# Patient Record
Sex: Male | Born: 1935 | Race: White | Hispanic: No | Marital: Married | Smoking: Former smoker
Health system: Southern US, Community
[De-identification: ages and names within clinical notes are randomized; demographics above are authoritative.]

## PROBLEM LIST (undated history)

## (undated) DIAGNOSIS — I2510 Atherosclerotic heart disease of native coronary artery without angina pectoris: Secondary | ICD-10-CM

## (undated) DIAGNOSIS — M1990 Unspecified osteoarthritis, unspecified site: Secondary | ICD-10-CM

## (undated) DIAGNOSIS — I10 Essential (primary) hypertension: Secondary | ICD-10-CM

## (undated) DIAGNOSIS — C61 Malignant neoplasm of prostate: Secondary | ICD-10-CM

## (undated) DIAGNOSIS — E785 Hyperlipidemia, unspecified: Secondary | ICD-10-CM

## (undated) HISTORY — DX: Atherosclerotic heart disease of native coronary artery without angina pectoris: I25.10

## (undated) HISTORY — PX: KNEE SURGERY: SHX244

## (undated) HISTORY — DX: Essential (primary) hypertension: I10

## (undated) HISTORY — DX: Unspecified osteoarthritis, unspecified site: M19.90

## (undated) HISTORY — DX: Malignant neoplasm of prostate: C61

## (undated) HISTORY — DX: Hyperlipidemia, unspecified: E78.5

---

## 1997-02-07 DIAGNOSIS — C61 Malignant neoplasm of prostate: Secondary | ICD-10-CM

## 1997-02-07 HISTORY — PX: OTHER SURGICAL HISTORY: SHX169

## 1997-02-07 HISTORY — DX: Malignant neoplasm of prostate: C61

## 2004-07-14 ENCOUNTER — Ambulatory Visit: Payer: Self-pay | Admitting: Cardiology

## 2004-07-20 ENCOUNTER — Ambulatory Visit: Payer: Self-pay | Admitting: Cardiology

## 2005-07-06 ENCOUNTER — Encounter: Admission: RE | Admit: 2005-07-06 | Discharge: 2005-07-06 | Payer: Self-pay | Admitting: Family Medicine

## 2008-05-08 HISTORY — PX: OTHER SURGICAL HISTORY: SHX169

## 2010-07-07 ENCOUNTER — Encounter: Payer: Self-pay | Admitting: Physician Assistant

## 2011-04-06 DIAGNOSIS — C61 Malignant neoplasm of prostate: Secondary | ICD-10-CM | POA: Diagnosis not present

## 2011-07-05 DIAGNOSIS — C61 Malignant neoplasm of prostate: Secondary | ICD-10-CM | POA: Diagnosis not present

## 2011-07-13 DIAGNOSIS — C61 Malignant neoplasm of prostate: Secondary | ICD-10-CM | POA: Diagnosis not present

## 2011-10-17 DIAGNOSIS — C61 Malignant neoplasm of prostate: Secondary | ICD-10-CM | POA: Diagnosis not present

## 2011-10-26 DIAGNOSIS — I10 Essential (primary) hypertension: Secondary | ICD-10-CM | POA: Diagnosis not present

## 2012-01-18 DIAGNOSIS — C61 Malignant neoplasm of prostate: Secondary | ICD-10-CM | POA: Diagnosis not present

## 2012-03-15 DIAGNOSIS — H04129 Dry eye syndrome of unspecified lacrimal gland: Secondary | ICD-10-CM | POA: Diagnosis not present

## 2012-03-15 DIAGNOSIS — H538 Other visual disturbances: Secondary | ICD-10-CM | POA: Diagnosis not present

## 2012-03-15 DIAGNOSIS — E119 Type 2 diabetes mellitus without complications: Secondary | ICD-10-CM | POA: Diagnosis not present

## 2012-03-15 DIAGNOSIS — H2510 Age-related nuclear cataract, unspecified eye: Secondary | ICD-10-CM | POA: Diagnosis not present

## 2012-04-18 DIAGNOSIS — R972 Elevated prostate specific antigen [PSA]: Secondary | ICD-10-CM | POA: Diagnosis not present

## 2012-04-25 DIAGNOSIS — C61 Malignant neoplasm of prostate: Secondary | ICD-10-CM | POA: Diagnosis not present

## 2012-08-01 DIAGNOSIS — C61 Malignant neoplasm of prostate: Secondary | ICD-10-CM | POA: Diagnosis not present

## 2012-09-21 ENCOUNTER — Encounter: Payer: Self-pay | Admitting: Family Medicine

## 2012-09-21 ENCOUNTER — Ambulatory Visit (INDEPENDENT_AMBULATORY_CARE_PROVIDER_SITE_OTHER): Payer: Medicare Other | Admitting: Family Medicine

## 2012-09-21 VITALS — BP 100/54 | HR 81 | Temp 97.3°F | Ht 68.0 in | Wt 229.6 lb

## 2012-09-21 DIAGNOSIS — E119 Type 2 diabetes mellitus without complications: Secondary | ICD-10-CM | POA: Diagnosis not present

## 2012-09-21 DIAGNOSIS — I10 Essential (primary) hypertension: Secondary | ICD-10-CM

## 2012-09-21 DIAGNOSIS — E785 Hyperlipidemia, unspecified: Secondary | ICD-10-CM | POA: Diagnosis not present

## 2012-09-21 DIAGNOSIS — L989 Disorder of the skin and subcutaneous tissue, unspecified: Secondary | ICD-10-CM

## 2012-09-21 MED ORDER — SITAGLIPTIN PHOSPHATE 100 MG PO TABS: 100.0000 mg | ORAL_TABLET | Freq: Every day | ORAL | Status: DC

## 2012-09-21 MED ORDER — PIOGLITAZONE HCL 15 MG PO TABS: 15.0000 mg | ORAL_TABLET | Freq: Every day | ORAL | Status: DC

## 2012-09-21 MED ORDER — ATORVASTATIN CALCIUM 20 MG PO TABS: 20.0000 mg | ORAL_TABLET | Freq: Every day | ORAL | Status: DC

## 2012-09-21 MED ORDER — LISINOPRIL-HYDROCHLOROTHIAZIDE 20-25 MG PO TABS: 1.0000 | ORAL_TABLET | Freq: Every day | ORAL | Status: DC

## 2012-09-21 NOTE — Patient Instructions (Signed)

## 2012-09-21 NOTE — Progress Notes (Signed)
  Subjective:    Patient ID: Victor Tapia., male    DOB: [birth year], 79 y.o.   MRN: [id number]  HPI This 79 y.o. male presents for evaluation of skin lesion on his right forehead.  He has  Hx of DM, hyperlipidemia,and htn.  He needs refills on medicine.   Review of Systems C/o skin lesion on right forehead. No chest pain, SOB, HA, dizziness, vision change, N/V, diarrhea, constipation, dysuria, urinary urgency or frequency, myalgias, arthralgias or rash.  Objective:   Physical Exam  Vital signs noted  Well developed well nourished male.  HEENT - Head atraumatic Normocephalic                Eyes - PERRLA, Conjuctiva - clear Sclera- Clear EOMI                Ears - EAC's Wnl TM's Wnl Gross Hearing WNL                Nose - Nares patent                 Throat - oropharanx wnl Respiratory - Lungs CTA bilateral Cardiac - RRR S1 and S2 without murmur GI - Abdomen soft Nontender and bowel sounds active x 4 Skin - Oval skin lesion on forehead lesion approx 0.5cm in diameter  Extremities - No edema. Neuro - Grossly intact.      Assessment & Plan:  Skin lesion - Plan: Ambulatory referral to Dermatology  Diabetes - Plan: pioglitazone (ACTOS) 15 MG tablet, sitaGLIPtin (JANUVIA) 100 MG tablet  Essential hypertension, benign - Plan: lisinopril-hydrochlorothiazide (PRINZIDE,ZESTORETIC) 20-25 MG per tablet  Other and unspecified hyperlipidemia - Plan: atorvastatin (LIPITOR) 20 MG tablet Patient declines labs today.  Follow up in 3 months for follow visit.

## 2012-10-22 DIAGNOSIS — C44319 Basal cell carcinoma of skin of other parts of face: Secondary | ICD-10-CM | POA: Diagnosis not present

## 2012-10-22 DIAGNOSIS — L821 Other seborrheic keratosis: Secondary | ICD-10-CM | POA: Diagnosis not present

## 2012-10-22 DIAGNOSIS — L570 Actinic keratosis: Secondary | ICD-10-CM | POA: Diagnosis not present

## 2012-10-22 DIAGNOSIS — D485 Neoplasm of uncertain behavior of skin: Secondary | ICD-10-CM | POA: Diagnosis not present

## 2012-11-01 DIAGNOSIS — L570 Actinic keratosis: Secondary | ICD-10-CM | POA: Diagnosis not present

## 2012-11-01 DIAGNOSIS — C44319 Basal cell carcinoma of skin of other parts of face: Secondary | ICD-10-CM | POA: Diagnosis not present

## 2012-11-05 DIAGNOSIS — C61 Malignant neoplasm of prostate: Secondary | ICD-10-CM | POA: Diagnosis not present

## 2012-11-12 DIAGNOSIS — L819 Disorder of pigmentation, unspecified: Secondary | ICD-10-CM | POA: Diagnosis not present

## 2012-11-12 DIAGNOSIS — D485 Neoplasm of uncertain behavior of skin: Secondary | ICD-10-CM | POA: Diagnosis not present

## 2013-01-07 ENCOUNTER — Telehealth: Payer: Self-pay | Admitting: *Deleted

## 2013-01-07 ENCOUNTER — Telehealth: Payer: Self-pay | Admitting: Pharmacist

## 2013-01-07 NOTE — Telephone Encounter (Signed)
Called to inform pt that he needs office visit for diabetes check up Pt declined appt, stated he would come in after the first of the year

## 2013-01-07 NOTE — Telephone Encounter (Signed)
Called patient to make Annual Wellness Visit - he refused appt.

## 2013-02-11 DIAGNOSIS — C61 Malignant neoplasm of prostate: Secondary | ICD-10-CM | POA: Diagnosis not present

## 2013-03-07 ENCOUNTER — Ambulatory Visit (INDEPENDENT_AMBULATORY_CARE_PROVIDER_SITE_OTHER): Payer: Medicare Other | Admitting: Family Medicine

## 2013-03-07 ENCOUNTER — Encounter: Payer: Self-pay | Admitting: Family Medicine

## 2013-03-07 VITALS — BP 152/71 | HR 83 | Temp 97.6°F | Resp 20 | Ht 68.0 in | Wt 228.0 lb

## 2013-03-07 DIAGNOSIS — E119 Type 2 diabetes mellitus without complications: Secondary | ICD-10-CM | POA: Diagnosis not present

## 2013-03-07 DIAGNOSIS — I10 Essential (primary) hypertension: Secondary | ICD-10-CM | POA: Diagnosis not present

## 2013-03-07 DIAGNOSIS — H60399 Other infective otitis externa, unspecified ear: Secondary | ICD-10-CM

## 2013-03-07 LAB — POCT CBC
Granulocyte percent: 74.6 %G (ref 37–80)
HCT, POC: 41.7 % — AB (ref 43.5–53.7)
Hemoglobin: 13.6 g/dL — AB (ref 14.1–18.1)
Lymph, poc: 1.8 (ref 0.6–3.4)
MCH, POC: 30.2 pg (ref 27–31.2)
MCHC: 32.7 g/dL (ref 31.8–35.4)
MCV: 92.5 fL (ref 80–97)
MPV: 8 fL (ref 0–99.8)
POC Granulocyte: 6.3 (ref 2–6.9)
POC LYMPH PERCENT: 21.3 %L (ref 10–50)
Platelet Count, POC: 205 10*3/uL (ref 142–424)
RBC: 4.5 M/uL — AB (ref 4.69–6.13)
RDW, POC: 13.8 %
WBC: 8.4 10*3/uL (ref 4.6–10.2)

## 2013-03-07 LAB — POCT GLYCOSYLATED HEMOGLOBIN (HGB A1C): Hemoglobin A1C: 7.1

## 2013-03-07 MED ORDER — NEOMYCIN-COLIST-HC-THONZONIUM 3.3-3-10-0.5 MG/ML OT SUSP: 3.0000 [drp] | Freq: Four times a day (QID) | OTIC | Status: DC

## 2013-03-07 NOTE — Progress Notes (Signed)
   Subjective:    Patient ID: Victor Tapia., male    DOB: May 18, 1926, 80 y.o.   MRN: [id number]  HPI  This 80 y.o. male presents for evaluation of hypertension, diabetes, and hyperlipidemia. He is due for labs  Review of Systems    No chest pain, SOB, HA, dizziness, vision change, N/V, diarrhea, constipation, dysuria, urinary urgency or frequency, myalgias, arthralgias or rash.   Objective:   Physical Exam  Vital signs noted  Well developed well nourished male.  HEENT - Head atraumatic Normocephalic                Eyes - PERRLA, Conjuctiva - clear Sclera- Clear EOMI                Ears - EAC's Wnl TM's Wnl Gross Hearing WNL                Throat - oropharanx wnl Respiratory - Lungs CTA bilateral Cardiac - RRR S1 and S2 without murmur GI - Abdomen soft Nontender and bowel sounds active x 4 Extremities - No edema. Neuro - Grossly intact.      Assessment & Plan:  Otitis, externa, infective - Plan: neomycin-colistin-hydrocortisone-thonzonium (CORTISPORIN-TC) 3.04-09-08-0.5 MG/ML otic suspension  Essential hypertension, benign - Plan: POCT CBC, CMP14+EGFR, TSH  Diabetes - Plan: POCT glycosylated hemoglobin (Hb A1C)  Lysbeth Penner FNP

## 2013-03-08 LAB — CMP14+EGFR
ALT: 12 IU/L (ref 0–44)
AST: 20 IU/L (ref 0–40)
Albumin/Globulin Ratio: 1.5 (ref 1.1–2.5)
Albumin: 4.4 g/dL (ref 3.5–4.7)
Alkaline Phosphatase: 106 IU/L (ref 39–117)
BUN/Creatinine Ratio: 17 (ref 10–22)
BUN: 26 mg/dL (ref 8–27)
CO2: 28 mmol/L (ref 18–29)
Calcium: 9.8 mg/dL (ref 8.6–10.2)
Chloride: 99 mmol/L (ref 97–108)
Creatinine, Ser: 1.52 mg/dL — ABNORMAL HIGH (ref 0.76–1.27)
GFR calc Af Amer: 48 mL/min/{1.73_m2} — ABNORMAL LOW (ref >59)
GFR calc non Af Amer: 41 mL/min/{1.73_m2} — ABNORMAL LOW (ref >59)
Globulin, Total: 2.9 g/dL (ref 1.5–4.5)
Glucose: 140 mg/dL — ABNORMAL HIGH (ref 65–99)
Potassium: 4.4 mmol/L (ref 3.5–5.2)
Sodium: 145 mmol/L — ABNORMAL HIGH (ref 134–144)
Total Bilirubin: 0.3 mg/dL (ref 0.0–1.2)
Total Protein: 7.3 g/dL (ref 6.0–8.5)

## 2013-03-08 LAB — TSH: TSH: 1.56 u[IU]/mL (ref 0.450–4.500)

## 2013-05-20 DIAGNOSIS — L570 Actinic keratosis: Secondary | ICD-10-CM | POA: Diagnosis not present

## 2013-05-20 DIAGNOSIS — L905 Scar conditions and fibrosis of skin: Secondary | ICD-10-CM | POA: Diagnosis not present

## 2013-05-20 DIAGNOSIS — Z85828 Personal history of other malignant neoplasm of skin: Secondary | ICD-10-CM | POA: Diagnosis not present

## 2013-05-20 DIAGNOSIS — D485 Neoplasm of uncertain behavior of skin: Secondary | ICD-10-CM | POA: Diagnosis not present

## 2013-05-20 DIAGNOSIS — D0420 Carcinoma in situ of skin of unspecified ear and external auricular canal: Secondary | ICD-10-CM | POA: Diagnosis not present

## 2013-05-22 DIAGNOSIS — C61 Malignant neoplasm of prostate: Secondary | ICD-10-CM | POA: Diagnosis not present

## 2013-05-28 ENCOUNTER — Encounter: Payer: Self-pay | Admitting: *Deleted

## 2013-06-04 DIAGNOSIS — L570 Actinic keratosis: Secondary | ICD-10-CM | POA: Diagnosis not present

## 2013-06-04 DIAGNOSIS — C44221 Squamous cell carcinoma of skin of unspecified ear and external auricular canal: Secondary | ICD-10-CM | POA: Diagnosis not present

## 2013-07-31 ENCOUNTER — Other Ambulatory Visit: Payer: Self-pay | Admitting: *Deleted

## 2013-08-07 ENCOUNTER — Telehealth: Payer: Self-pay | Admitting: *Deleted

## 2013-08-07 NOTE — Telephone Encounter (Signed)
Received rx refill request from [facility] for lisinopril 20mg . Do not see this on med list. Please advise on refill. You saw him in 1-15.

## 2013-08-08 ENCOUNTER — Telehealth: Payer: Self-pay | Admitting: Family Medicine

## 2013-08-08 DIAGNOSIS — I10 Essential (primary) hypertension: Secondary | ICD-10-CM

## 2013-08-08 MED ORDER — LISINOPRIL-HYDROCHLOROTHIAZIDE 20-25 MG PO TABS: 1.0000 | ORAL_TABLET | Freq: Every day | ORAL | Status: DC

## 2013-08-08 NOTE — Telephone Encounter (Signed)
Done per Dietrich Pates VO

## 2013-08-10 ENCOUNTER — Other Ambulatory Visit: Payer: Self-pay | Admitting: Family Medicine

## 2013-08-12 ENCOUNTER — Other Ambulatory Visit: Payer: Self-pay | Admitting: Family Medicine

## 2013-08-12 NOTE — Telephone Encounter (Signed)
He was on zestril hct and this is in the note.  He is elderly and hct can cause more work on kidneys.  Take the lisinopril sent by the rx company and follow up and will check bp

## 2013-08-13 NOTE — Telephone Encounter (Signed)
Last seen and last labs 03/07/13  B [place]

## 2013-08-15 NOTE — Telephone Encounter (Signed)
Called [facility] patient is usually on Lisinopril and HCTZ separately. We ordered Zestoretic, this is ok, pt used to be on zestoretic until it went on back order, then they split it. Pt made aware that when new bottle comes, that it will only be one pill. Pt understands

## 2013-08-22 DIAGNOSIS — C61 Malignant neoplasm of prostate: Secondary | ICD-10-CM | POA: Diagnosis not present

## 2013-08-24 ENCOUNTER — Other Ambulatory Visit: Payer: Self-pay | Admitting: Family Medicine

## 2013-09-03 ENCOUNTER — Ambulatory Visit (INDEPENDENT_AMBULATORY_CARE_PROVIDER_SITE_OTHER): Payer: Medicare Other | Admitting: Family Medicine

## 2013-09-03 VITALS — BP 118/54 | HR 73 | Temp 98.6°F | Ht 68.0 in | Wt 230.8 lb

## 2013-09-03 DIAGNOSIS — E119 Type 2 diabetes mellitus without complications: Secondary | ICD-10-CM | POA: Diagnosis not present

## 2013-09-03 DIAGNOSIS — E785 Hyperlipidemia, unspecified: Secondary | ICD-10-CM

## 2013-09-03 DIAGNOSIS — I10 Essential (primary) hypertension: Secondary | ICD-10-CM

## 2013-09-03 NOTE — Progress Notes (Signed)
   Subjective:    Patient ID: Victor Tapia., male    DOB: Aug 08, 1926, 80 y.o.   MRN: [id number]  HPI This 80 y.o. male presents for evaluation of diabetes visit.  He has hx of DM, hyperlipidemia, and hypertension.  He has no acute complaints.   Review of Systems No chest pain, SOB, HA, dizziness, vision change, N/V, diarrhea, constipation, dysuria, urinary urgency or frequency, myalgias, arthralgias or rash.     Objective:   Physical Exam  Vital signs noted  Well developed well nourished male.  HEENT - Head atraumatic Normocephalic                Eyes - PERRLA, Conjuctiva - clear Sclera- Clear EOMI                Ears - EAC's Wnl TM's Wnl Gross Hearing WNL                Nose - Nares patent                 Throat - oropharanx wnl Respiratory - Lungs CTA bilateral Cardiac - RRR S1 and S2 without murmur GI - Abdomen soft Nontender and bowel sounds active x 4 Extremities - No edema. Neuro - Grossly intact.      Assessment & Plan:  Diabetes mellitus type 2, controlled - Plan: POCT glycosylated hemoglobin (Hb A1C), CMP14+EGFR, Lipid panel  Essential hypertension - Plan: POCT CBC, CMP14+EGFR, Lipid panel  Hyperlipemia - Plan: CMP14+EGFR  Essential hypertension, benign - Conitnue current regimen.  Follow up in 3 months  Lysbeth Penner FNP

## 2013-09-04 ENCOUNTER — Ambulatory Visit: Payer: Medicare Other | Admitting: Family Medicine

## 2013-09-04 ENCOUNTER — Other Ambulatory Visit: Payer: Medicare Other

## 2013-09-04 DIAGNOSIS — E785 Hyperlipidemia, unspecified: Secondary | ICD-10-CM | POA: Diagnosis not present

## 2013-09-04 DIAGNOSIS — E119 Type 2 diabetes mellitus without complications: Secondary | ICD-10-CM

## 2013-09-04 DIAGNOSIS — I10 Essential (primary) hypertension: Secondary | ICD-10-CM | POA: Diagnosis not present

## 2013-09-04 LAB — POCT CBC
Granulocyte percent: 69 %G (ref 37–80)
HCT, POC: 38.3 % — AB (ref 43.5–53.7)
Hemoglobin: 12.5 g/dL — AB (ref 14.1–18.1)
Lymph, poc: 2.3 (ref 0.6–3.4)
MCH, POC: 30.2 pg (ref 27–31.2)
MCHC: 32.7 g/dL (ref 31.8–35.4)
MCV: 92.6 fL (ref 80–97)
MPV: 7.7 fL (ref 0–99.8)
POC Granulocyte: 6.2 (ref 2–6.9)
POC LYMPH PERCENT: 25.5 %L (ref 10–50)
Platelet Count, POC: 236 10*3/uL (ref 142–424)
RBC: 4.1 M/uL — AB (ref 4.69–6.13)
RDW, POC: 13.3 %
WBC: 9 10*3/uL (ref 4.6–10.2)

## 2013-09-04 LAB — POCT GLYCOSYLATED HEMOGLOBIN (HGB A1C): Hemoglobin A1C: 6.6

## 2013-09-05 ENCOUNTER — Other Ambulatory Visit: Payer: Self-pay | Admitting: Family Medicine

## 2013-09-05 DIAGNOSIS — C61 Malignant neoplasm of prostate: Secondary | ICD-10-CM | POA: Diagnosis not present

## 2013-09-05 LAB — CMP14+EGFR
A/G RATIO: 1.4 (ref 1.1–2.5)
ALT: 11 IU/L (ref 0–44)
AST: 18 IU/L (ref 0–40)
Albumin: 4.2 g/dL (ref 3.5–4.7)
Alkaline Phosphatase: 86 IU/L (ref 39–117)
BUN/Creatinine Ratio: 23 — ABNORMAL HIGH (ref 10–22)
BUN: 39 mg/dL — AB (ref 8–27)
CHLORIDE: 96 mmol/L — AB (ref 97–108)
CO2: 24 mmol/L (ref 18–29)
CREATININE: 1.66 mg/dL — AB (ref 0.76–1.27)
Calcium: 9.8 mg/dL (ref 8.6–10.2)
GFR calc Af Amer: 43 mL/min/{1.73_m2} — ABNORMAL LOW (ref >59)
GFR calc non Af Amer: 37 mL/min/{1.73_m2} — ABNORMAL LOW (ref >59)
GLOBULIN, TOTAL: 2.9 g/dL (ref 1.5–4.5)
GLUCOSE: 128 mg/dL — AB (ref 65–99)
Potassium: 5.6 mmol/L — ABNORMAL HIGH (ref 3.5–5.2)
SODIUM: 138 mmol/L (ref 134–144)
TOTAL PROTEIN: 7.1 g/dL (ref 6.0–8.5)
Total Bilirubin: 0.4 mg/dL (ref 0.0–1.2)

## 2013-09-05 LAB — LIPID PANEL
CHOL/HDL RATIO: 2.6 ratio (ref 0.0–5.0)
Cholesterol, Total: 133 mg/dL (ref 100–199)
HDL: 51 mg/dL (ref >39)
LDL CALC: 58 mg/dL (ref 0–99)
Triglycerides: 120 mg/dL (ref 0–149)
VLDL CHOLESTEROL CAL: 24 mg/dL (ref 5–40)

## 2013-09-05 MED ORDER — AMLODIPINE BESYLATE 5 MG PO TABS: 5.0000 mg | ORAL_TABLET | Freq: Every day | ORAL | Status: DC

## 2013-09-19 ENCOUNTER — Encounter: Payer: Self-pay | Admitting: Family Medicine

## 2013-09-19 ENCOUNTER — Ambulatory Visit (INDEPENDENT_AMBULATORY_CARE_PROVIDER_SITE_OTHER): Payer: Medicare Other | Admitting: Family Medicine

## 2013-09-19 VITALS — BP 150/65 | HR 73 | Temp 98.0°F | Ht 68.0 in | Wt 231.0 lb

## 2013-09-19 DIAGNOSIS — I10 Essential (primary) hypertension: Secondary | ICD-10-CM | POA: Diagnosis not present

## 2013-09-19 MED ORDER — METOPROLOL SUCCINATE ER 25 MG PO TB24: 25.0000 mg | ORAL_TABLET | Freq: Every day | ORAL | Status: DC

## 2013-09-19 NOTE — Progress Notes (Signed)
   Subjective:    Patient ID: Victor Tapia., male    DOB: May 04, 1926, 80 y.o.   MRN: [id number]  HPI  This 80 y.o. male presents for evaluation of follow up on hypertension.  Review of Systems No chest pain, SOB, HA, dizziness, vision change, N/V, diarrhea, constipation, dysuria, urinary urgency or frequency, myalgias, arthralgias or rash.     Objective:   Physical Exam Vital signs noted  Well developed well nourished male.  HEENT - Head atraumatic Normocephalic                Eyes - PERRLA, Conjuctiva - clear Sclera- Clear EOMI                Ears - EAC's Wnl TM's Wnl Gross Hearing WNL                Throat - oropharanx wnl Respiratory - Lungs CTA bilateral Cardiac - RRR S1 and S2 without murmur GI - Abdomen soft Nontender and bowel sounds active x 4 Extremities - No edema. Neuro - Grossly intact.       Assessment & Plan:  Essential hypertension - Plan: metoprolol succinate (TOPROL-XL) 25 MG 24 hr tablet Add this to the norvasc 5mg  po qd and follow up in one month  Lysbeth Penner FNP

## 2013-10-18 ENCOUNTER — Other Ambulatory Visit: Payer: Self-pay | Admitting: Family Medicine

## 2013-10-24 ENCOUNTER — Ambulatory Visit (INDEPENDENT_AMBULATORY_CARE_PROVIDER_SITE_OTHER): Payer: Medicare Other | Admitting: Family Medicine

## 2013-10-24 ENCOUNTER — Other Ambulatory Visit: Payer: Self-pay | Admitting: Family Medicine

## 2013-10-24 ENCOUNTER — Encounter: Payer: Self-pay | Admitting: Family Medicine

## 2013-10-24 VITALS — BP 137/65 | HR 65 | Temp 98.2°F | Ht 68.0 in | Wt 232.0 lb

## 2013-10-24 DIAGNOSIS — I10 Essential (primary) hypertension: Secondary | ICD-10-CM

## 2013-10-24 MED ORDER — METOPROLOL SUCCINATE ER 25 MG PO TB24: 25.0000 mg | ORAL_TABLET | Freq: Every day | ORAL | Status: DC

## 2013-10-24 NOTE — Progress Notes (Signed)
   Subjective:    Patient ID: Victor Tapia., male    DOB: 1926/05/19, 80 y.o.   MRN: [id number]  HPI This 80 y.o. male presents for evaluation of follow up on hypertension.  He was taking lisinopril hct and he had worsening renal fx's and so his bp medicine has been changed.   Review of Systems No chest pain, SOB, HA, dizziness, vision change, N/V, diarrhea, constipation, dysuria, urinary urgency or frequency, myalgias, arthralgias or rash.     Objective:   Physical Exam  Vital signs noted  Well developed well nourished male.  HEENT - Head atraumatic Normocephalic                Eyes - PERRLA, Conjuctiva - clear Sclera- Clear EOMI                Ears - EAC's Wnl TM's Wnl Gross Hearing WNL                Throat - oropharanx wnl Respiratory - Lungs CTA bilateral Cardiac - RRR S1 and S2 without murmur GI - Abdomen soft Nontender and bowel sounds active x 4 Extremities - No edema. Neuro - Grossly intact.      Assessment & Plan:  Unspecified essential hypertension - Plan: BMP8+EGFR  Essential hypertension - Plan: metoprolol succinate (TOPROL-XL) 25 MG 24 hr tablet  Follow up in 33month  WLysbeth PennerFNP

## 2013-10-25 LAB — BMP8+EGFR
BUN/Creatinine Ratio: 14 (ref 10–22)
BUN: 23 mg/dL (ref 8–27)
CO2: 25 mmol/L (ref 18–29)
Calcium: 9.5 mg/dL (ref 8.6–10.2)
Chloride: 101 mmol/L (ref 97–108)
Creatinine, Ser: 1.67 mg/dL — ABNORMAL HIGH (ref 0.76–1.27)
GFR calc Af Amer: 42 mL/min/{1.73_m2} — ABNORMAL LOW (ref >59)
GFR calc non Af Amer: 37 mL/min/{1.73_m2} — ABNORMAL LOW (ref >59)
Glucose: 120 mg/dL — ABNORMAL HIGH (ref 65–99)
Potassium: 4.9 mmol/L (ref 3.5–5.2)
Sodium: 141 mmol/L (ref 134–144)

## 2013-11-06 ENCOUNTER — Other Ambulatory Visit: Payer: Self-pay | Admitting: Family Medicine

## 2013-11-26 DIAGNOSIS — Z85828 Personal history of other malignant neoplasm of skin: Secondary | ICD-10-CM | POA: Diagnosis not present

## 2013-11-26 DIAGNOSIS — L570 Actinic keratosis: Secondary | ICD-10-CM | POA: Diagnosis not present

## 2013-12-04 DIAGNOSIS — C61 Malignant neoplasm of prostate: Secondary | ICD-10-CM | POA: Diagnosis not present

## 2014-01-18 ENCOUNTER — Other Ambulatory Visit: Payer: Self-pay | Admitting: Family Medicine

## 2014-01-24 ENCOUNTER — Ambulatory Visit (INDEPENDENT_AMBULATORY_CARE_PROVIDER_SITE_OTHER): Payer: Medicare Other | Admitting: Family Medicine

## 2014-01-24 ENCOUNTER — Encounter: Payer: Self-pay | Admitting: Family Medicine

## 2014-01-24 VITALS — BP 147/73 | HR 64 | Temp 97.5°F | Ht 68.0 in | Wt 232.0 lb

## 2014-01-24 DIAGNOSIS — N189 Chronic kidney disease, unspecified: Secondary | ICD-10-CM

## 2014-01-24 DIAGNOSIS — E1129 Type 2 diabetes mellitus with other diabetic kidney complication: Secondary | ICD-10-CM | POA: Diagnosis not present

## 2014-01-24 DIAGNOSIS — Z125 Encounter for screening for malignant neoplasm of prostate: Secondary | ICD-10-CM | POA: Diagnosis not present

## 2014-01-24 DIAGNOSIS — E1122 Type 2 diabetes mellitus with diabetic chronic kidney disease: Secondary | ICD-10-CM

## 2014-01-24 LAB — POCT CBC
Granulocyte percent: 71.3 %G (ref 37–80)
HCT, POC: 38.6 % — AB (ref 43.5–53.7)
Hemoglobin: 12.5 g/dL — AB (ref 14.1–18.1)
Lymph, poc: 2.5 (ref 0.6–3.4)
MCH, POC: 29.7 pg (ref 27–31.2)
MCHC: 32.4 g/dL (ref 31.8–35.4)
MCV: 91.8 fL (ref 80–97)
MPV: 8.5 fL (ref 0–99.8)
POC Granulocyte: 6.8 (ref 2–6.9)
POC LYMPH PERCENT: 26.1 %L (ref 10–50)
Platelet Count, POC: 193 10*3/uL (ref 142–424)
RBC: 4.2 M/uL — AB (ref 4.69–6.13)
RDW, POC: 14.1 %
WBC: 9.6 10*3/uL (ref 4.6–10.2)

## 2014-01-24 LAB — POCT GLYCOSYLATED HEMOGLOBIN (HGB A1C): Hemoglobin A1C: 6.8

## 2014-01-24 MED ORDER — SITAGLIPTIN PHOSPHATE 100 MG PO TABS: 100.0000 mg | ORAL_TABLET | Freq: Every day | ORAL | Status: DC

## 2014-01-24 NOTE — Progress Notes (Signed)
   Subjective:    Patient ID: Victor Journey., male    DOB: 08/10/1926, 80 y.o.   MRN: [id number]  HPI He is here for follow up.  He has been referred to urologist in [facility] and wants PSA.  He has hx of HTN, DM, and hyperlipidemia.  Review of Systems  Constitutional: Negative for fever.  HENT: Negative for ear pain.   Eyes: Negative for discharge.  Respiratory: Negative for cough.   Cardiovascular: Negative for chest pain.  Gastrointestinal: Negative for abdominal distention.  Endocrine: Negative for polyuria.  Genitourinary: Negative for difficulty urinating.  Musculoskeletal: Negative for gait problem and neck pain.  Skin: Negative for color change and rash.  Neurological: Negative for speech difficulty and headaches.  Psychiatric/Behavioral: Negative for agitation.       Objective:    BP 147/73 mmHg  Pulse 64  Temp(Src) 97.5 F (36.4 C) (Oral)  Ht $R'5\' 8"'qn$  (1.727 m)  Wt 232 lb (105.235 kg)  BMI 35.28 kg/m2 Physical Exam  Constitutional: He is oriented to person, place, and time. He appears well-developed and well-nourished.  HENT:  Head: Normocephalic and atraumatic.  Mouth/Throat: Oropharynx is clear and moist.  Eyes: Pupils are equal, round, and reactive to light.  Neck: Normal range of motion. Neck supple.  Cardiovascular: Normal rate and regular rhythm.   No murmur heard. Pulmonary/Chest: Effort normal and breath sounds normal.  Abdominal: Soft. Bowel sounds are normal. There is no tenderness.  Neurological: He is alert and oriented to person, place, and time.  Skin: Skin is warm and dry.  Psychiatric: He has a normal mood and affect.          Assessment & Plan:     ICD-9-CM ICD-10-CM   1. Type 2 diabetes mellitus with diabetic chronic kidney disease 250.40 E11.22 sitaGLIPtin (JANUVIA) 100 MG tablet   585.9 N18.9 POCT CBC     POCT glycosylated hemoglobin (Hb A1C)     CMP14+EGFR     PSA, total and free     No Follow-up on file.  Lysbeth Penner  FNP

## 2014-01-25 ENCOUNTER — Other Ambulatory Visit: Payer: Self-pay | Admitting: Family Medicine

## 2014-01-25 LAB — CMP14+EGFR
ALT: 12 IU/L (ref 0–44)
AST: 16 IU/L (ref 0–40)
Albumin/Globulin Ratio: 1.3 (ref 1.1–2.5)
Albumin: 4 g/dL (ref 3.5–4.7)
Alkaline Phosphatase: 95 IU/L (ref 39–117)
BUN/Creatinine Ratio: 19 (ref 10–22)
BUN: 28 mg/dL — ABNORMAL HIGH (ref 8–27)
CO2: 26 mmol/L (ref 18–29)
Calcium: 9.3 mg/dL (ref 8.6–10.2)
Chloride: 101 mmol/L (ref 97–108)
Creatinine, Ser: 1.44 mg/dL — ABNORMAL HIGH (ref 0.76–1.27)
GFR calc Af Amer: 51 mL/min/{1.73_m2} — ABNORMAL LOW (ref >59)
GFR calc non Af Amer: 44 mL/min/{1.73_m2} — ABNORMAL LOW (ref >59)
Globulin, Total: 3 g/dL (ref 1.5–4.5)
Glucose: 113 mg/dL — ABNORMAL HIGH (ref 65–99)
Potassium: 4.9 mmol/L (ref 3.5–5.2)
Sodium: 141 mmol/L (ref 134–144)
Total Bilirubin: 0.3 mg/dL (ref 0.0–1.2)
Total Protein: 7 g/dL (ref 6.0–8.5)

## 2014-01-25 LAB — PSA, TOTAL AND FREE
PSA, Free Pct: >10 %
PSA, Free: 0.01 ng/mL
PSA: <0.1 ng/mL (ref 0.0–4.0)

## 2014-01-27 ENCOUNTER — Telehealth: Payer: Self-pay

## 2014-01-27 NOTE — Telephone Encounter (Signed)
Pt aware of results and faxed to Dr. Exie Parody

## 2014-01-27 NOTE — Telephone Encounter (Signed)
-----   Message from Lysbeth Penner, FNP sent at 01/25/2014  9:53 AM EST ----- PSA is less than one and this is good and follow up with urology.  Mildly anemic and stable.  Kidney and liver fx is normal and diabetes is controlled.

## 2014-03-03 DIAGNOSIS — R312 Other microscopic hematuria: Secondary | ICD-10-CM | POA: Diagnosis not present

## 2014-03-03 DIAGNOSIS — Z8546 Personal history of malignant neoplasm of prostate: Secondary | ICD-10-CM | POA: Diagnosis not present

## 2014-03-03 DIAGNOSIS — R3915 Urgency of urination: Secondary | ICD-10-CM | POA: Diagnosis not present

## 2014-03-03 DIAGNOSIS — R3919 Other difficulties with micturition: Secondary | ICD-10-CM | POA: Diagnosis not present

## 2014-03-11 DIAGNOSIS — R312 Other microscopic hematuria: Secondary | ICD-10-CM | POA: Diagnosis not present

## 2014-03-11 DIAGNOSIS — N281 Cyst of kidney, acquired: Secondary | ICD-10-CM | POA: Diagnosis not present

## 2014-03-11 DIAGNOSIS — Z8546 Personal history of malignant neoplasm of prostate: Secondary | ICD-10-CM | POA: Diagnosis not present

## 2014-03-17 ENCOUNTER — Other Ambulatory Visit: Payer: Self-pay | Admitting: Family Medicine

## 2014-03-18 NOTE — Telephone Encounter (Signed)
Last seen 01/24/14 B [place]  Last lipid 09/04/13

## 2014-04-01 DIAGNOSIS — N329 Bladder disorder, unspecified: Secondary | ICD-10-CM | POA: Diagnosis not present

## 2014-04-01 DIAGNOSIS — R312 Other microscopic hematuria: Secondary | ICD-10-CM | POA: Diagnosis not present

## 2014-04-01 DIAGNOSIS — N281 Cyst of kidney, acquired: Secondary | ICD-10-CM | POA: Diagnosis not present

## 2014-04-01 DIAGNOSIS — C61 Malignant neoplasm of prostate: Secondary | ICD-10-CM | POA: Diagnosis not present

## 2014-04-07 DIAGNOSIS — R319 Hematuria, unspecified: Secondary | ICD-10-CM | POA: Diagnosis not present

## 2014-04-15 DIAGNOSIS — C61 Malignant neoplasm of prostate: Secondary | ICD-10-CM | POA: Diagnosis not present

## 2014-04-15 DIAGNOSIS — R312 Other microscopic hematuria: Secondary | ICD-10-CM | POA: Diagnosis not present

## 2014-04-15 DIAGNOSIS — R3915 Urgency of urination: Secondary | ICD-10-CM | POA: Diagnosis not present

## 2014-04-15 DIAGNOSIS — N529 Male erectile dysfunction, unspecified: Secondary | ICD-10-CM | POA: Diagnosis not present

## 2014-05-16 DIAGNOSIS — C61 Malignant neoplasm of prostate: Secondary | ICD-10-CM | POA: Diagnosis not present

## 2014-05-16 DIAGNOSIS — N529 Male erectile dysfunction, unspecified: Secondary | ICD-10-CM | POA: Diagnosis not present

## 2014-05-20 DIAGNOSIS — C44319 Basal cell carcinoma of skin of other parts of face: Secondary | ICD-10-CM | POA: Diagnosis not present

## 2014-05-20 DIAGNOSIS — D485 Neoplasm of uncertain behavior of skin: Secondary | ICD-10-CM | POA: Diagnosis not present

## 2014-05-20 DIAGNOSIS — Z85828 Personal history of other malignant neoplasm of skin: Secondary | ICD-10-CM | POA: Diagnosis not present

## 2014-05-20 DIAGNOSIS — L570 Actinic keratosis: Secondary | ICD-10-CM | POA: Diagnosis not present

## 2014-06-12 DIAGNOSIS — C44319 Basal cell carcinoma of skin of other parts of face: Secondary | ICD-10-CM | POA: Diagnosis not present

## 2014-06-12 DIAGNOSIS — C44310 Basal cell carcinoma of skin of unspecified parts of face: Secondary | ICD-10-CM | POA: Diagnosis not present

## 2014-06-26 DIAGNOSIS — Z4802 Encounter for removal of sutures: Secondary | ICD-10-CM | POA: Diagnosis not present

## 2014-07-26 ENCOUNTER — Other Ambulatory Visit: Payer: Self-pay | Admitting: Family Medicine

## 2014-07-29 ENCOUNTER — Other Ambulatory Visit: Payer: Self-pay

## 2014-07-29 MED ORDER — AMLODIPINE BESYLATE 5 MG PO TABS: 5.0000 mg | ORAL_TABLET | Freq: Every day | ORAL | Status: DC

## 2014-08-12 ENCOUNTER — Encounter: Payer: Self-pay | Admitting: Family

## 2014-08-12 ENCOUNTER — Ambulatory Visit (INDEPENDENT_AMBULATORY_CARE_PROVIDER_SITE_OTHER): Payer: Medicare Other | Admitting: Family

## 2014-08-12 ENCOUNTER — Encounter (INDEPENDENT_AMBULATORY_CARE_PROVIDER_SITE_OTHER): Payer: Self-pay

## 2014-08-12 ENCOUNTER — Ambulatory Visit (INDEPENDENT_AMBULATORY_CARE_PROVIDER_SITE_OTHER): Payer: Medicare Other

## 2014-08-12 VITALS — BP 126/72 | HR 70 | Temp 98.0°F | Ht 68.0 in | Wt 227.0 lb

## 2014-08-12 DIAGNOSIS — M79671 Pain in right foot: Secondary | ICD-10-CM

## 2014-08-12 DIAGNOSIS — M7731 Calcaneal spur, right foot: Secondary | ICD-10-CM

## 2014-08-12 MED ORDER — BUPIVACAINE HCL 0.25 % IJ SOLN
1.0000 mL | Freq: Once | INTRAMUSCULAR | Status: AC
  Administered 2014-08-12: 1 mL via INTRA_ARTICULAR

## 2014-08-12 MED ORDER — METHYLPREDNISOLONE ACETATE 40 MG/ML IJ SUSP
40.0000 mg | Freq: Once | INTRAMUSCULAR | Status: AC
  Administered 2014-08-12: 40 mg via INTRA_ARTICULAR

## 2014-08-12 NOTE — Patient Instructions (Signed)
Heel Spur A heel spur is a hook of bone that can form on the calcaneus (the heel bone and the largest bone of the foot). Heel spurs are often associated with plantar fasciitis and usually come in people who have had the problem for an extended period of time. The cause of the relationship is unknown. The pain associated with them is thought to be caused by an inflammation (soreness and redness) of the plantar fascia rather than the spur itself. The plantar fascia is a thick fibrous like tissue that runs from the calcaneus (heel bone) to the ball of the foot. This strong, tight tissue helps maintain the arch of your foot. It helps distribute the weight across your foot as you walk or run. Stresses placed on the plantar fascia can be tremendous. When it is inflamed normal activities become painful. Pain is worse in the morning after sleeping. After sleeping the plantar fascia is tight. The first movements stretch the fascia and this causes pain. As the tendon loosens, the pain usually gets better. It often returns with too much standing or walking.  About 70% of patients with plantar fasciitis have a heel spur. About half of people without foot pain also have heel spurs. DIAGNOSIS  The diagnosis of a heel spur is made by X-ray. The X-ray shows a hook of bone protruding from the bottom of the calcaneus at the point where the plantar fascia is attached to the heel bone.  TREATMENT  It is necessary to find out what is causing the stretching of the plantar fascia. If the cause is over-pronation (flat feet), orthotics and proper foot ware may help.  Stretching exercises, losing weight, wearing shoes that have a cushioned heel that absorbs shock, and elevating the heel with the use of a heel cradle, heel cup, or orthotics may all help. Heel cradles and heel cups provide extra comfort and cushion to the heel, and reduce the amount of shock to the sore area. AVOIDING THE PAIN OF PLANTAR FASCIITIS AND HEEL  SPURS  Consult a sports medicine professional before beginning a new exercise program.  Walking programs offer a good workout. There is a lower chance of overuse injuries common to the runners. There is less impact and less jarring of the joints.  Begin all new exercise programs slowly. If problems or pains develop, decrease the amount of time or distance until you are at a comfortable level.  Wear good shoes and replace them regularly.  Stretch your foot and the heel cords at the back of the ankle (Achilles tendons) both before and after exercise.  Run or exercise on even surfaces that are not hard. For example, asphalt is better than pavement.  Do not run barefoot on hard surfaces.  If using a treadmill, vary the incline.  Do not continue to workout if you have foot or joint problems. Seek professional help if they do not improve. HOME CARE INSTRUCTIONS   Avoid activities that cause you pain until you recover.  Use ice or cold packs to the problem or painful areas after working out.  Only take over-the-counter or prescription medicines for pain, discomfort, or fever as directed by your caregiver.  Soft shoe inserts or athletic shoes with air or gel sole cushions may be helpful.  If problems continue or become more severe, consult a sports medicine caregiver. Cortisone is a potent anti-inflammatory medication that may be injected into the painful area. You can discuss this treatment with your caregiver. MAKE SURE YOU:  Understand these instructions.  Will watch your condition.  Will get help right away if you are not doing well or get worse. Document Released: 03/02/2005 Document Revised: 04/18/2011 Document Reviewed: 03/27/2013 [facility] Patient Information 2015 [place], [place]. This information is not intended to replace advice given to you by your health care provider. Make sure you discuss any questions you have with your health care provider.

## 2014-08-12 NOTE — Progress Notes (Signed)
   Subjective:    Patient ID: Victor Journey., male    DOB: December 30, 1926, 81 y.o.   MRN: [id number]  Foot Pain This is a new problem. The current episode started 1 to 4 weeks ago. The problem occurs constantly. The problem has been unchanged. Pertinent negatives include no chills, congestion, coughing, fever, nausea or visual change. The symptoms are aggravated by walking. He has tried acetaminophen and NSAIDs for the symptoms. The treatment provided mild relief.      Review of Systems  Constitutional: Negative.  Negative for fever and chills.  HENT: Negative.  Negative for congestion.   Respiratory: Negative.  Negative for cough.   Cardiovascular: Negative.   Gastrointestinal: Negative.  Negative for nausea.  Endocrine: Negative.   Genitourinary: Negative.   Musculoskeletal: Negative.   Neurological: Negative.   Hematological: Negative.   Psychiatric/Behavioral: Negative.   All other systems reviewed and are negative.      Objective:   Physical Exam  Constitutional: He is oriented to person, place, and time. He appears well-developed and well-nourished. No distress.  HENT:  Head: Normocephalic.  Eyes: Pupils are equal, round, and reactive to light. Right eye exhibits no discharge. Left eye exhibits no discharge.  Neck: Normal range of motion. Neck supple. No thyromegaly present.  Cardiovascular: Normal rate, regular rhythm, normal heart sounds and intact distal pulses.   No murmur heard. Pulmonary/Chest: Effort normal and breath sounds normal. No respiratory distress. He has no wheezes.  Abdominal: Soft. Bowel sounds are normal. He exhibits no distension. There is no tenderness.  Musculoskeletal: Normal range of motion. He exhibits tenderness. He exhibits no edema.  Tenderness with palpation of right heel   Neurological: He is alert and oriented to person, place, and time. He has normal reflexes. No cranial nerve deficit.  Skin: Skin is warm and dry. No rash noted. No erythema.    Psychiatric: He has a normal mood and affect. His behavior is normal. Judgment and thought content normal.  Vitals reviewed.  Right foot- Heel spur Preliminary reading by Evelina Dun, FNP [facility]   BP 126/72 mmHg  Pulse 70  Temp(Src) 98 F (36.7 C) (Oral)  Ht 5\' 8"  (1.727 m)  Wt 227 lb (102.967 kg)  BMI 34.52 kg/m2      Assessment & Plan:  1. Pain in heel, right - DG Foot Complete Right; Future  2. Heel spur, right -Good foot support discussed -Getting bottle of water and freezing- Roll foot heel to toe several times a day -Tylenol prn for pain RTP prn - methylPREDNISolone acetate (DEPO-MEDROL) injection 40 mg; Inject 1 mL (40 mg total) into the articular space once. - bupivacaine (MARCAINE) 0.25 % (with pres) injection 1 mL; Inject 1 mL into the articular space once.  Evelina Dun, FNP

## 2014-08-18 ENCOUNTER — Encounter: Payer: Self-pay | Admitting: Family Medicine

## 2014-08-18 ENCOUNTER — Ambulatory Visit (INDEPENDENT_AMBULATORY_CARE_PROVIDER_SITE_OTHER): Payer: Medicare Other | Admitting: Family Medicine

## 2014-08-18 VITALS — BP 153/77 | HR 59 | Temp 97.0°F | Ht 68.0 in | Wt 230.0 lb

## 2014-08-18 DIAGNOSIS — M722 Plantar fascial fibromatosis: Secondary | ICD-10-CM

## 2014-08-18 MED ORDER — DICLOFENAC SODIUM 75 MG PO TBEC: 75.0000 mg | DELAYED_RELEASE_TABLET | Freq: Two times a day (BID) | ORAL | Status: DC

## 2014-08-18 NOTE — Progress Notes (Signed)
Subjective:  Patient ID: Victor Journey., male    DOB: 01-03-1927  Age: 81 y.o. MRN: [id number]  CC: bone spur   HPI Victor Tapia. presents for pain at right plantar heel. He had an injection 1 weeks ago here. The medicine helped for about 4 days and now the pain is back. He was told he had a heel spur. Today he is in stating that his pain is 7/10. It makes it difficult to walk on the heel. He does have a heel insert that he has not been using. He is a diabetic. He denies that the glucose went up after the injection.  History Victor Tapia has a past medical history of Prostate cancer (1999); Hypertension; Diabetes mellitus; CAD (coronary artery disease); Hyperlipidemia; and OA (osteoarthritis).   He has past surgical history that includes TRIPLE BY PASS (1999); CATARACT  SURGERY RIGHT EYE (4/10); and Knee surgery (Right).   His family history includes Cancer in his brother and sister.He reports that he has never smoked. He does not have any smokeless tobacco history on file. He reports that he drinks alcohol. He reports that he does not use illicit drugs.  Outpatient Prescriptions Prior to Visit  Medication Sig Dispense Refill  . amLODipine (NORVASC) 5 MG tablet Take 1 tablet (5 mg total) by mouth daily. 90 tablet 0  . aspirin 325 MG tablet Take 325 mg by mouth daily.      Marland Kitchen atorvastatin (LIPITOR) 20 MG tablet TAKE 1 TABLET DAILY 30 tablet 0  . Fluvastatin Sodium (LESCOL PO) Take by mouth.    . metoprolol succinate (TOPROL-XL) 25 MG 24 hr tablet Take 1 tablet (25 mg total) by mouth daily. 90 tablet 3  . Multiple Vitamins-Minerals (MENS 50+ MULTI VITAMIN/MIN PO) Take by mouth.    . pioglitazone (ACTOS) 15 MG tablet TAKE 1 TABLET DAILY 90 tablet 0  . sitaGLIPtin (JANUVIA) 100 MG tablet Take 1 tablet (100 mg total) by mouth daily. 90 tablet 3   No facility-administered medications prior to visit.    ROS Review of Systems  Constitutional: Negative for fever, chills, activity change and  appetite change.  HENT: Negative for ear discharge, ear pain, hearing loss, nosebleeds, sneezing and trouble swallowing.   Respiratory: Negative for chest tightness and shortness of breath.   Cardiovascular: Negative for chest pain and palpitations.  Musculoskeletal: Positive for myalgias and arthralgias (see history of present illness related to right foot pain).  Skin: Negative for rash.  Neurological: Negative.   Psychiatric/Behavioral: Negative.     Objective:  BP 153/77 mmHg  Pulse 59  Temp(Src) 97 F (36.1 C) (Oral)  Ht 5\' 8"  (1.727 m)  Wt 230 lb (104.327 kg)  BMI 34.98 kg/m2  BP Readings from Last 3 Encounters:  08/18/14 153/77  08/12/14 126/72  01/24/14 147/73    Wt Readings from Last 3 Encounters:  08/18/14 230 lb (104.327 kg)  08/12/14 227 lb (102.967 kg)  01/24/14 232 lb (105.235 kg)     Physical Exam  Constitutional: He appears well-developed and well-nourished.  HENT:  Head: Normocephalic and atraumatic.  Right Ear: External ear normal.  Left Ear: External ear normal.  Mouth/Throat: No oropharyngeal exudate or posterior oropharyngeal erythema.  Eyes: Pupils are equal, round, and reactive to light.  Neck: Normal range of motion. Neck supple.  Cardiovascular: Normal rate and regular rhythm.   No murmur heard. Pulmonary/Chest: Breath sounds normal. No respiratory distress.  Abdominal: Bowel sounds are normal.  Musculoskeletal: He exhibits tenderness (Right plantar  calcaneal insertion of the plantar fascia.).  Vitals reviewed.   Lab Results  Component Value Date   HGBA1C 6.8% 01/24/2014   HGBA1C 6.6 09/04/2013   HGBA1C 7.1 03/07/2013    Lab Results  Component Value Date   WBC 9.6 01/24/2014   HGB 12.5* 01/24/2014   HCT 38.6* 01/24/2014   GLUCOSE 113* 01/24/2014   CHOL 133 09/04/2013   TRIG 120 09/04/2013   HDL 51 09/04/2013   LDLCALC 58 09/04/2013   ALT 12 01/24/2014   AST 16 01/24/2014   NA 141 01/24/2014   K 4.9 01/24/2014   CL 101  01/24/2014   CREATININE 1.44* 01/24/2014   BUN 28* 01/24/2014   CO2 26 01/24/2014   TSH 1.560 03/07/2013   PSA <0.1 01/24/2014   HGBA1C 6.8% 01/24/2014    Mr Extrem Low Jt*r* W/o Cm  07/07/2005   Clinical Data: Knee pain and swelling for one year. History of prior medial meniscal surgery in 1965.   MRI RIGHT KNEE WITHOUT CONTRAST:  Technique: Multiplanar, multisequence MR imaging of the knee was performed following the standard protocol. No intravenous contrast was administered.  No prior studies.   Findings: There is a grade III tear of the posterior horn of the lateral meniscus extending to the inferior meniscal surface and involving the meniscal apex, as shown on images 5 and 6of series 8. Linear grade II signal is present in the anterior horn of the lateral meniscus.   The medial meniscus is markedly diminutive and also degenerated, with high signal throughout it's substance and with only a small sliver of visible residual meniscal tissue. This appears to be somewhat extruded medially, and is probably supplying minimal if any support against compressive forces in the medial compartment. The medial collateral ligament and lateral collateral ligament appear intact. The cruciate ligaments appear intact as does the extensor mechanism.   There is prominent tricompartmental degenerative arthropathy, with prominent spurring. There is marked heterogeneity of articular cartilage in the medial compartment with considerable irregularity of the articular cartilage, and with a non fragmented osteochondral injury along the medial femoral condyle, shown for example on image 10 of series 6. The lateral compartment likewise demonstrates considerable articular cartilage heterogeneity with multifocal chondral lesions. The patella femoral joint demonstrates severe loss of articular cartilage laterally along the lateral patellar facet and lateral femoral trochlear notch, all with an appearance suggesting excessive lateral  pressure. There is prominent intraosseous erosion or cyst formation posteriorly along the tibial plateau, in the vicinity a very prominent posterior tibial plateau spur.   The popliteus tendon and structures of the posterolateral corner of the knee appear intact. There is small low signal lesion posteromedially within the joint, for example on image 11 of series 4 which could be an extension of an osteophyte but could be a free osteochondral fragment. The patellar retinacula appear grossly intact.   IMPRESSION:   1. Prominent tricompartmental osteoarthritis, with prominent heterogeneity and irregularity of articular cartilage as detailed above.   2. Grade III tear of the posterior horn of the lateral meniscus.   3. Very diminutive size and diffusely abnormal signal in the small remaining medial meniscus. This small sliver of remaining medial meniscal tissue appears prominently degenerated.   4. Moderate knee effusion.   5. Probable excessive lateral pressure in the patella femoral joint.   6. There is a focus of osteochondral injury along the medial femoral condyle articular surface.   7. Possible small free osteochondral fragment posteromedially in the knee joint.  Provider: [place]:   Victor Tapia was seen today for bone spur.  Diagnoses and all orders for this visit:  Plantar fasciitis  Other orders -     Discontinue: diclofenac (VOLTAREN) 75 MG EC tablet; Take 1 tablet (75 mg total) by mouth 2 (two) times daily. -     diclofenac (VOLTAREN) 75 MG EC tablet; Take 1 tablet (75 mg total) by mouth 2 (two) times daily.  I am having Victor Tapia maintain his aspirin, Multiple Vitamins-Minerals (MENS 50+ MULTI VITAMIN/MIN PO), metoprolol succinate, Fluvastatin Sodium (LESCOL PO), sitaGLIPtin, pioglitazone, atorvastatin, amLODipine, and diclofenac.  Meds ordered this encounter  Medications  . DISCONTD: diclofenac (VOLTAREN) 75 MG EC tablet    Sig: Take 1 tablet (75 mg total) by mouth 2  (two) times daily.    Dispense:  60 tablet    Refill:  2  . diclofenac (VOLTAREN) 75 MG EC tablet    Sig: Take 1 tablet (75 mg total) by mouth 2 (two) times daily.    Dispense:  60 tablet    Refill:  2     Follow-up: Return in about 2 weeks (around 09/01/2014).  Claretta Fraise, M.D.

## 2014-10-01 ENCOUNTER — Other Ambulatory Visit: Payer: Self-pay | Admitting: Family Medicine

## 2014-10-02 NOTE — Telephone Encounter (Signed)
Last seen 08/18/14  Dr Livia Snellen   Last lipid 09/04/13

## 2014-10-06 ENCOUNTER — Other Ambulatory Visit: Payer: Self-pay | Admitting: Family Medicine

## 2014-10-06 NOTE — Telephone Encounter (Signed)
Called patient to schedule for a follow up.  He states he has company in town until mid September, and will call to schedule an appointment for later in September when they have left.

## 2014-10-14 ENCOUNTER — Other Ambulatory Visit: Payer: Self-pay | Admitting: Family Medicine

## 2014-10-28 ENCOUNTER — Ambulatory Visit (HOSPITAL_COMMUNITY)
Admission: RE | Admit: 2014-10-28 | Discharge: 2014-10-28 | Disposition: A | Discharge status: Home / Self Care | Payer: Medicare Other | Source: Ambulatory Visit | Attending: Family Medicine | Admitting: Family Medicine

## 2014-10-28 ENCOUNTER — Encounter: Payer: Self-pay | Admitting: Family Medicine

## 2014-10-28 ENCOUNTER — Ambulatory Visit (INDEPENDENT_AMBULATORY_CARE_PROVIDER_SITE_OTHER): Payer: Medicare Other | Admitting: Family Medicine

## 2014-10-28 ENCOUNTER — Encounter (INDEPENDENT_AMBULATORY_CARE_PROVIDER_SITE_OTHER): Payer: Self-pay

## 2014-10-28 ENCOUNTER — Telehealth: Payer: Self-pay | Admitting: *Deleted

## 2014-10-28 VITALS — Ht 68.0 in | Wt 235.2 lb

## 2014-10-28 DIAGNOSIS — E1122 Type 2 diabetes mellitus with diabetic chronic kidney disease: Secondary | ICD-10-CM | POA: Diagnosis not present

## 2014-10-28 DIAGNOSIS — M7989 Other specified soft tissue disorders: Secondary | ICD-10-CM | POA: Diagnosis not present

## 2014-10-28 DIAGNOSIS — N183 Chronic kidney disease, stage 3 (moderate): Secondary | ICD-10-CM

## 2014-10-28 DIAGNOSIS — M7731 Calcaneal spur, right foot: Secondary | ICD-10-CM | POA: Diagnosis not present

## 2014-10-28 DIAGNOSIS — M7730 Calcaneal spur, unspecified foot: Secondary | ICD-10-CM | POA: Insufficient documentation

## 2014-10-28 DIAGNOSIS — I10 Essential (primary) hypertension: Secondary | ICD-10-CM | POA: Insufficient documentation

## 2014-10-28 DIAGNOSIS — M25561 Pain in right knee: Secondary | ICD-10-CM

## 2014-10-28 DIAGNOSIS — R609 Edema, unspecified: Secondary | ICD-10-CM

## 2014-10-28 DIAGNOSIS — E785 Hyperlipidemia, unspecified: Secondary | ICD-10-CM | POA: Insufficient documentation

## 2014-10-28 DIAGNOSIS — M79604 Pain in right leg: Secondary | ICD-10-CM | POA: Insufficient documentation

## 2014-10-28 DIAGNOSIS — N189 Chronic kidney disease, unspecified: Secondary | ICD-10-CM | POA: Diagnosis not present

## 2014-10-28 MED ORDER — PREDNISONE 10 MG PO TABS: ORAL_TABLET | ORAL | Status: DC

## 2014-10-28 NOTE — Progress Notes (Signed)
Subjective:  Patient ID: Victor Tapia., male    DOB: 1926-08-16  Age: 81 y.o. MRN: [id number]  CC: Foot Pain and Knee Pain   HPI Victor Tapia. presents for RLE pain swelling and warmth. At foot and running up calf. Pain increasing. Had a cortisone shot in the foot at onset 3 months ago. Some temporary relief but not complete. It has continued to increase since that time. The pain is a dull ache primarily in the posterior leg. It interferes with ambulation due to exacerbation of pain.  History Waseem has a past medical history of Prostate cancer (1999); Hypertension; Diabetes mellitus; CAD (coronary artery disease); Hyperlipidemia; and OA (osteoarthritis).   He has past surgical history that includes TRIPLE BY PASS (1999); CATARACT  SURGERY RIGHT EYE (4/10); and Knee surgery (Right).   His family history includes Cancer in his brother and sister.He reports that he has never smoked. He does not have any smokeless tobacco history on file. He reports that he drinks alcohol. He reports that he does not use illicit drugs.  Outpatient Prescriptions Prior to Visit  Medication Sig Dispense Refill  . amLODipine (NORVASC) 5 MG tablet TAKE 1 TABLET DAILY (NEED TO BE SEEN) 90 tablet 0  . aspirin 325 MG tablet Take 325 mg by mouth daily.      Marland Kitchen atorvastatin (LIPITOR) 20 MG tablet TAKE 1 TABLET DAILY 30 tablet 0  . Fluvastatin Sodium (LESCOL PO) Take by mouth.    . Multiple Vitamins-Minerals (MENS 50+ MULTI VITAMIN/MIN PO) Take by mouth.    . pioglitazone (ACTOS) 15 MG tablet TAKE 1 TABLET DAILY 90 tablet 0  . sitaGLIPtin (JANUVIA) 100 MG tablet Take 1 tablet (100 mg total) by mouth daily. 90 tablet 3  . TOPROL XL 25 MG 24 hr tablet TAKE 1 TABLET DAILY 90 tablet 0  . diclofenac (VOLTAREN) 75 MG EC tablet Take 1 tablet (75 mg total) by mouth 2 (two) times daily. 60 tablet 2   No facility-administered medications prior to visit.    ROS Review of Systems  Constitutional: Negative for fever,  chills and diaphoresis.  Respiratory: Negative for cough, shortness of breath and wheezing.   Cardiovascular: Negative for chest pain.  Musculoskeletal: Positive for myalgias, joint swelling and arthralgias.       See history of present illness  Skin: Negative for rash.  Neurological: Negative for headaches.    Objective:  Ht 5\' 8"  (1.727 m)  Wt 235 lb 3.2 oz (106.686 kg)  BMI 35.77 kg/m2  BP Readings from Last 3 Encounters:  08/18/14 153/77  08/12/14 126/72  01/24/14 147/73    Wt Readings from Last 3 Encounters:  10/28/14 235 lb 3.2 oz (106.686 kg)  08/18/14 230 lb (104.327 kg)  08/12/14 227 lb (102.967 kg)     Physical Exam  Constitutional: He is oriented to person, place, and time. He appears well-developed and well-nourished.  HENT:  Head: Normocephalic and atraumatic.  Right Ear: Tympanic membrane and external ear normal. No decreased hearing is noted.  Left Ear: Tympanic membrane and external ear normal. No decreased hearing is noted.  Mouth/Throat: No oropharyngeal exudate or posterior oropharyngeal erythema.  Eyes: Pupils are equal, round, and reactive to light.  Neck: Normal range of motion. Neck supple.  Cardiovascular: Normal rate and regular rhythm.   No murmur heard. Pulmonary/Chest: Breath sounds normal. No respiratory distress.  Abdominal: Soft. Bowel sounds are normal. He exhibits no mass. There is no tenderness.  Musculoskeletal: Normal range of motion.  He exhibits edema (mild edema right lower extremity at the foot and ankle and to the mid calf. Nonpitting 1-2+) and tenderness (moderate tenderness of the right calf).  Neurological: He is alert and oriented to person, place, and time. No cranial nerve deficit. He exhibits abnormal muscle tone. Coordination normal.  Psychiatric: He has a normal mood and affect. Thought content normal.  Vitals reviewed.   Lab Results  Component Value Date   HGBA1C 6.8% 01/24/2014   HGBA1C 6.6 09/04/2013   HGBA1C 7.1  03/07/2013    Lab Results  Component Value Date   WBC 9.6 01/24/2014   HGB 12.5* 01/24/2014   HCT 38.6* 01/24/2014   GLUCOSE 113* 01/24/2014   CHOL 133 09/04/2013   TRIG 120 09/04/2013   HDL 51 09/04/2013   LDLCALC 58 09/04/2013   ALT 12 01/24/2014   AST 16 01/24/2014   NA 141 01/24/2014   K 4.9 01/24/2014   CL 101 01/24/2014   CREATININE 1.44* 01/24/2014   BUN 28* 01/24/2014   CO2 26 01/24/2014   TSH 1.560 03/07/2013   PSA <0.1 01/24/2014   HGBA1C 6.8% 01/24/2014    Mr Extrem Low Jt*r* W/o Cm  07/07/2005   Clinical Data: Knee pain and swelling for one year. History of prior medial meniscal surgery in 1965.   MRI RIGHT KNEE WITHOUT CONTRAST:  Technique: Multiplanar, multisequence MR imaging of the knee was performed following the standard protocol. No intravenous contrast was administered.  No prior studies.   Findings: There is a grade III tear of the posterior horn of the lateral meniscus extending to the inferior meniscal surface and involving the meniscal apex, as shown on images 5 and 6of series 8. Linear grade II signal is present in the anterior horn of the lateral meniscus.   The medial meniscus is markedly diminutive and also degenerated, with high signal throughout it's substance and with only a small sliver of visible residual meniscal tissue. This appears to be somewhat extruded medially, and is probably supplying minimal if any support against compressive forces in the medial compartment. The medial collateral ligament and lateral collateral ligament appear intact. The cruciate ligaments appear intact as does the extensor mechanism.   There is prominent tricompartmental degenerative arthropathy, with prominent spurring. There is marked heterogeneity of articular cartilage in the medial compartment with considerable irregularity of the articular cartilage, and with a non fragmented osteochondral injury along the medial femoral condyle, shown for example on image 10 of series 6.  The lateral compartment likewise demonstrates considerable articular cartilage heterogeneity with multifocal chondral lesions. The patella femoral joint demonstrates severe loss of articular cartilage laterally along the lateral patellar facet and lateral femoral trochlear notch, all with an appearance suggesting excessive lateral pressure. There is prominent intraosseous erosion or cyst formation posteriorly along the tibial plateau, in the vicinity a very prominent posterior tibial plateau spur.   The popliteus tendon and structures of the posterolateral corner of the knee appear intact. There is small low signal lesion posteromedially within the joint, for example on image 11 of series 4 which could be an extension of an osteophyte but could be a free osteochondral fragment. The patellar retinacula appear grossly intact.   IMPRESSION:   1. Prominent tricompartmental osteoarthritis, with prominent heterogeneity and irregularity of articular cartilage as detailed above.   2. Grade III tear of the posterior horn of the lateral meniscus.   3. Very diminutive size and diffusely abnormal signal in the small remaining medial meniscus. This small sliver  of remaining medial meniscal tissue appears prominently degenerated.   4. Moderate knee effusion.   5. Probable excessive lateral pressure in the patella femoral joint.   6. There is a focus of osteochondral injury along the medial femoral condyle articular surface.   7. Possible small free osteochondral fragment posteromedially in the knee joint.  Provider: [place]:   Odies was seen today for foot pain and knee pain.  Diagnoses and all orders for this visit:  Edema -     Cancel: LE VENOUS; Future -     Cancel: US Venous Img Lower Unilateral Left; Future -     US Venous Img Lower Unilateral Right; Future  Pain in joint, lower leg, right -     Cancel: LE VENOUS; Future -     Cancel: US Venous Img Lower Unilateral Left; Future -      US Venous Img Lower Unilateral Right; Future  Other orders -     predniSONE (DELTASONE) 10 MG tablet; Take 5 daily for 3 days followed by 4,3,2 and 1 for 3 days each.   I have discontinued Mr. Decuir diclofenac. I am also having him start on predniSONE. Additionally, I am having him maintain his aspirin, Multiple Vitamins-Minerals (MENS 50+ MULTI VITAMIN/MIN PO), Fluvastatin Sodium (LESCOL PO), sitaGLIPtin, pioglitazone, atorvastatin, TOPROL XL, and amLODipine.  Meds ordered this encounter  Medications  . predniSONE (DELTASONE) 10 MG tablet    Sig: Take 5 daily for 3 days followed by 4,3,2 and 1 for 3 days each.    Dispense:  45 tablet    Refill:  0     Follow-up: Return in about 2 weeks (around 11/11/2014) for diabetes, leg pain.  Claretta Fraise, M.D.

## 2014-10-28 NOTE — Telephone Encounter (Signed)
Megan from [facility] radiology called-studies negative for DVT in right leg. Stp and advised he is ok to go and he has follow up appt with [place] 9/23 at 3:30. Pt advised to keep appt and to CB if symptoms worsen. Pt voiced understanding.

## 2014-10-31 ENCOUNTER — Ambulatory Visit (INDEPENDENT_AMBULATORY_CARE_PROVIDER_SITE_OTHER): Payer: Medicare Other | Admitting: Family Medicine

## 2014-10-31 ENCOUNTER — Encounter: Payer: Self-pay | Admitting: Family Medicine

## 2014-10-31 VITALS — BP 171/68 | HR 69 | Temp 97.6°F | Ht 68.0 in | Wt 233.8 lb

## 2014-10-31 DIAGNOSIS — I10 Essential (primary) hypertension: Secondary | ICD-10-CM | POA: Diagnosis not present

## 2014-10-31 DIAGNOSIS — N189 Chronic kidney disease, unspecified: Secondary | ICD-10-CM

## 2014-10-31 DIAGNOSIS — R5383 Other fatigue: Secondary | ICD-10-CM | POA: Diagnosis not present

## 2014-10-31 DIAGNOSIS — M7731 Calcaneal spur, right foot: Secondary | ICD-10-CM | POA: Diagnosis not present

## 2014-10-31 DIAGNOSIS — E785 Hyperlipidemia, unspecified: Secondary | ICD-10-CM | POA: Diagnosis not present

## 2014-10-31 DIAGNOSIS — N400 Enlarged prostate without lower urinary tract symptoms: Secondary | ICD-10-CM

## 2014-10-31 DIAGNOSIS — E1122 Type 2 diabetes mellitus with diabetic chronic kidney disease: Secondary | ICD-10-CM | POA: Diagnosis not present

## 2014-10-31 DIAGNOSIS — M79671 Pain in right foot: Secondary | ICD-10-CM | POA: Diagnosis not present

## 2014-10-31 DIAGNOSIS — D649 Anemia, unspecified: Secondary | ICD-10-CM | POA: Diagnosis not present

## 2014-10-31 DIAGNOSIS — Z125 Encounter for screening for malignant neoplasm of prostate: Secondary | ICD-10-CM | POA: Diagnosis not present

## 2014-10-31 DIAGNOSIS — R7989 Other specified abnormal findings of blood chemistry: Secondary | ICD-10-CM | POA: Diagnosis not present

## 2014-10-31 LAB — POCT GLYCOSYLATED HEMOGLOBIN (HGB A1C): Hemoglobin A1C: 6.6

## 2014-10-31 NOTE — Patient Instructions (Signed)
Take diclofenac until prednisone arrives. Resume it after finishing the prednisone

## 2014-10-31 NOTE — Progress Notes (Signed)
Subjective:  Patient ID: Victor Tapia., male    DOB: 14-Mar-1926  Age: 81 y.o. MRN: [id number]  CC: Diabetes; Hypertension; and Hyperlipidemia   HPI Victor Tapia. presents for patient continues to have pain in the posterior distal right leg as well as pain at the right heel.   follow-up of hypertension. Patient has no history of headache chest pain or shortness of breath or recent cough. Patient also denies symptoms of TIA such as numbness weakness lateralizing. Patient checks  blood pressure at home and has not had any elevated readings recently. Patient denies side effects from his medication. States taking it regularly.   Patient in for follow-up of elevated cholesterol. Doing well without complaints on current medication. Denies side effects of statin including myalgia and arthralgia and nausea. Also in today for liver function testing. Currently no chest pain, shortness of breath or other cardiovascular related symptoms noted.  Follow-up of diabetes. Patient does not check blood sugar at home Patient denies symptoms such as polyuria, polydipsia, excessive hunger, nausea No significant hypoglycemic spells noted. Medications as noted below. Taking them regularly without complication/adverse reaction being reported today.   History Victor Tapia has a past medical history of Prostate cancer (1999); Hypertension; Diabetes mellitus; CAD (coronary artery disease); Hyperlipidemia; and OA (osteoarthritis).   Victor Tapia has past surgical history that includes TRIPLE BY PASS (1999); CATARACT  SURGERY RIGHT EYE (4/10); and Knee surgery (Right).   His family history includes Cancer in his brother and sister.Victor Tapia reports that Victor Tapia has never smoked. Victor Tapia does not have any smokeless tobacco history on file. Victor Tapia reports that Victor Tapia drinks alcohol. Victor Tapia reports that Victor Tapia does not use illicit drugs.  Outpatient Prescriptions Prior to Visit  Medication Sig Dispense Refill  . amLODipine (NORVASC) 5 MG tablet TAKE 1 TABLET DAILY  (NEED TO BE SEEN) 90 tablet 0  . aspirin 325 MG tablet Take 325 mg by mouth daily.      Marland Kitchen atorvastatin (LIPITOR) 20 MG tablet TAKE 1 TABLET DAILY 30 tablet 0  . Fluvastatin Sodium (LESCOL PO) Take by mouth.    . Multiple Vitamins-Minerals (MENS 50+ MULTI VITAMIN/MIN PO) Take by mouth.    . pioglitazone (ACTOS) 15 MG tablet TAKE 1 TABLET DAILY 90 tablet 0  . sitaGLIPtin (JANUVIA) 100 MG tablet Take 1 tablet (100 mg total) by mouth daily. 90 tablet 3  . TOPROL XL 25 MG 24 hr tablet TAKE 1 TABLET DAILY 90 tablet 0  . predniSONE (DELTASONE) 10 MG tablet Take 5 daily for 3 days followed by 4,3,2 and 1 for 3 days each. (Patient not taking: Reported on 10/31/2014) 45 tablet 0   No facility-administered medications prior to visit.    ROS Review of Systems  Constitutional: Negative for fever, chills and diaphoresis.  HENT: Negative for congestion, rhinorrhea and sore throat.   Respiratory: Negative for cough, shortness of breath and wheezing.   Cardiovascular: Negative for chest pain.  Gastrointestinal: Negative for nausea, vomiting, abdominal pain, diarrhea, constipation and abdominal distention.  Genitourinary: Negative for dysuria and frequency.  Musculoskeletal: Positive for joint swelling and arthralgias.  Skin: Negative for rash.  Neurological: Negative for headaches.    Objective:  BP 171/68 mmHg  Pulse 69  Temp(Src) 97.6 F (36.4 C) (Oral)  Ht 5' 8"  (1.727 m)  Wt 233 lb 12.8 oz (106.051 kg)  BMI 35.56 kg/m2  BP Readings from Last 3 Encounters:  10/31/14 171/68  08/18/14 153/77  08/12/14 126/72    Wt Readings from Last 3  Encounters:  10/31/14 233 lb 12.8 oz (106.051 kg)  10/28/14 235 lb 3.2 oz (106.686 kg)  08/18/14 230 lb (104.327 kg)     Physical Exam  Constitutional: Victor Tapia is oriented to person, place, and time. Victor Tapia appears well-developed and well-nourished. No distress.  HENT:  Head: Normocephalic and atraumatic.  Right Ear: External ear normal.  Left Ear: External  ear normal.  Nose: Nose normal.  Mouth/Throat: Oropharynx is clear and moist.  Eyes: Conjunctivae and EOM are normal. Pupils are equal, round, and reactive to light.  Neck: Normal range of motion. Neck supple. No thyromegaly present.  Cardiovascular: Normal rate, regular rhythm and normal heart sounds.   No murmur heard. Pulmonary/Chest: Effort normal and breath sounds normal. No respiratory distress. Victor Tapia has no wheezes. Victor Tapia has no rales.  Abdominal: Soft. Bowel sounds are normal. Victor Tapia exhibits no distension. There is no tenderness.  Musculoskeletal: Victor Tapia exhibits tenderness (plantar calcaneal surface of the right foot).  Lymphadenopathy:    Victor Tapia has no cervical adenopathy.  Neurological: Victor Tapia is alert and oriented to person, place, and time. Victor Tapia has normal reflexes.  Skin: Skin is warm and dry.  Psychiatric: Victor Tapia has a normal mood and affect. His behavior is normal. Judgment and thought content normal.    Lab Results  Component Value Date   HGBA1C 6.6 10/31/2014   HGBA1C 6.8% 01/24/2014   HGBA1C 6.6 09/04/2013    Lab Results  Component Value Date   WBC 9.6 01/24/2014   HGB 12.5* 01/24/2014   HCT 38.6* 01/24/2014   GLUCOSE 113* 01/24/2014   CHOL 133 09/04/2013   TRIG 120 09/04/2013   HDL 51 09/04/2013   LDLCALC 58 09/04/2013   ALT 12 01/24/2014   AST 16 01/24/2014   NA 141 01/24/2014   K 4.9 01/24/2014   CL 101 01/24/2014   CREATININE 1.44* 01/24/2014   BUN 28* 01/24/2014   CO2 26 01/24/2014   TSH 1.560 03/07/2013   PSA <0.1 01/24/2014   HGBA1C 6.6 10/31/2014    US Venous Img Lower Unilateral Right  10/28/2014   CLINICAL DATA:  Right lower extremity swelling and pain.  EXAM: RIGHT LOWER EXTREMITY VENOUS DOPPLER ULTRASOUND  TECHNIQUE: Gray-scale sonography with graded compression, as well as color Doppler and duplex ultrasound were performed to evaluate the lower extremity deep venous systems from the level of the common femoral vein and including the common femoral, femoral,  profunda femoral, popliteal and calf veins including the posterior tibial, peroneal and gastrocnemius veins when visible. The superficial great saphenous vein was also interrogated. Spectral Doppler was utilized to evaluate flow at rest and with distal augmentation maneuvers in the common femoral, femoral and popliteal veins.  COMPARISON:  None.  FINDINGS: Contralateral Common Femoral Vein: Respiratory phasicity is normal and symmetric with the symptomatic side. No evidence of thrombus. Normal compressibility.  Common Femoral Vein: No evidence of thrombus. Normal compressibility, respiratory phasicity and response to augmentation.  Saphenofemoral Junction: No evidence of thrombus. Normal compressibility and flow on color Doppler imaging.  Profunda Femoral Vein: No evidence of thrombus. Normal compressibility and flow on color Doppler imaging.  Femoral Vein: No evidence of thrombus. Normal compressibility, respiratory phasicity and response to augmentation.  Popliteal Vein: No evidence of thrombus. Normal compressibility, respiratory phasicity and response to augmentation.  Calf Veins: No evidence of thrombus. Normal compressibility and flow on color Doppler imaging.  Superficial Great Saphenous Vein: No evidence of thrombus. Normal compressibility and flow on color Doppler imaging.  Venous Reflux:  None.  Other  Findings:  None.  IMPRESSION: No evidence of deep venous thrombosis.   Electronically Signed   By: Abelardo Diesel M.D.   On: 10/28/2014 14:51    Assessment & Plan:   Cove was seen today for diabetes, hypertension and hyperlipidemia.  Diagnoses and all orders for this visit:  Type 2 diabetes mellitus with diabetic chronic kidney disease -     POCT glycosylated hemoglobin (Hb A1C) -     CMP14+EGFR  Hyperlipemia -     CMP14+EGFR -     Lipid panel  Essential hypertension, benign -     CBC with Differential/Platelet  BPH (benign prostatic hypertrophy) -     PSA, total and free  Heel spur,  right  Right foot pain   I am having Victor Tapia maintain his aspirin, Multiple Vitamins-Minerals (MENS 50+ MULTI VITAMIN/MIN PO), Fluvastatin Sodium (LESCOL PO), sitaGLIPtin, pioglitazone, atorvastatin, TOPROL XL, amLODipine, predniSONE, and diclofenac.  Meds ordered this encounter  Medications  . diclofenac (VOLTAREN) 75 MG EC tablet    Sig: TAKE 1 TABLET (75 MG TOTAL) BY MOUTH 2 (TWO) TIMES DAILY.    Refill:  2    A steroid injection was performed at right plantar calcaneal insertion of the plantar fascia using 1% plain Lidocaine for local anesthesia. After sterile prep 1 cc marcan and 6 mg of Celestone was injected into the joint space. This was well tolerated.   Follow-up: Return in about 3 months (around 01/30/2015).  Claretta Fraise, M.D.

## 2014-11-01 LAB — LIPID PANEL
CHOLESTEROL TOTAL: 119 mg/dL (ref 100–199)
Chol/HDL Ratio: 2.6 ratio units (ref 0.0–5.0)
HDL: 46 mg/dL (ref >39)
LDL CALC: 48 mg/dL (ref 0–99)
TRIGLYCERIDES: 126 mg/dL (ref 0–149)
VLDL Cholesterol Cal: 25 mg/dL (ref 5–40)

## 2014-11-01 LAB — CBC WITH DIFFERENTIAL/PLATELET
BASOS ABS: 0 10*3/uL (ref 0.0–0.2)
Basos: 0 %
EOS (ABSOLUTE): 0.2 10*3/uL (ref 0.0–0.4)
EOS: 3 %
HEMATOCRIT: 34.8 % — AB (ref 37.5–51.0)
Hemoglobin: 11.1 g/dL — ABNORMAL LOW (ref 12.6–17.7)
Immature Grans (Abs): 0 10*3/uL (ref 0.0–0.1)
Immature Granulocytes: 0 %
LYMPHS ABS: 2.4 10*3/uL (ref 0.7–3.1)
Lymphs: 28 %
MCH: 30.6 pg (ref 26.6–33.0)
MCHC: 31.9 g/dL (ref 31.5–35.7)
MCV: 96 fL (ref 79–97)
MONOS ABS: 0.9 10*3/uL (ref 0.1–0.9)
Monocytes: 10 %
Neutrophils Absolute: 5.2 10*3/uL (ref 1.4–7.0)
Neutrophils: 59 %
Platelets: 222 10*3/uL (ref 150–379)
RBC: 3.63 x10E6/uL — AB (ref 4.14–5.80)
RDW: 15.1 % (ref 12.3–15.4)
WBC: 8.7 10*3/uL (ref 3.4–10.8)

## 2014-11-01 LAB — CMP14+EGFR
ALK PHOS: 97 IU/L (ref 39–117)
ALT: 28 IU/L (ref 0–44)
AST: 21 IU/L (ref 0–40)
Albumin/Globulin Ratio: 1.5 (ref 1.1–2.5)
Albumin: 4.1 g/dL (ref 3.5–4.7)
BILIRUBIN TOTAL: 0.2 mg/dL (ref 0.0–1.2)
BUN/Creatinine Ratio: 18 (ref 10–22)
BUN: 29 mg/dL — ABNORMAL HIGH (ref 8–27)
CHLORIDE: 104 mmol/L (ref 97–108)
CO2: 27 mmol/L (ref 18–29)
CREATININE: 1.65 mg/dL — AB (ref 0.76–1.27)
Calcium: 9.1 mg/dL (ref 8.6–10.2)
GFR calc Af Amer: 43 mL/min/{1.73_m2} — ABNORMAL LOW (ref >59)
GFR calc non Af Amer: 37 mL/min/{1.73_m2} — ABNORMAL LOW (ref >59)
GLOBULIN, TOTAL: 2.7 g/dL (ref 1.5–4.5)
Glucose: 109 mg/dL — ABNORMAL HIGH (ref 65–99)
POTASSIUM: 5.2 mmol/L (ref 3.5–5.2)
SODIUM: 145 mmol/L — AB (ref 134–144)
Total Protein: 6.8 g/dL (ref 6.0–8.5)

## 2014-11-01 LAB — PSA, TOTAL AND FREE
PSA, Free: 0.01 ng/mL
Prostate Specific Ag, Serum: <0.1 ng/mL (ref 0.0–4.0)

## 2014-11-04 ENCOUNTER — Other Ambulatory Visit: Payer: Self-pay | Admitting: *Deleted

## 2014-11-06 LAB — ANEMIA PROFILE B
FERRITIN: 59 ng/mL (ref 30–400)
Folate: >20 ng/mL (ref >3.0)
Iron Saturation: 17 % (ref 15–55)
Iron: 58 ug/dL (ref 38–169)
TIBC: 333 ug/dL (ref 250–450)
UIBC: 275 ug/dL (ref 111–343)
Vitamin B-12: 581 pg/mL (ref 211–946)

## 2014-11-06 LAB — SPECIMEN STATUS REPORT

## 2014-11-08 ENCOUNTER — Other Ambulatory Visit: Payer: Self-pay | Admitting: Family Medicine

## 2014-11-19 DIAGNOSIS — Z23 Encounter for immunization: Secondary | ICD-10-CM | POA: Diagnosis not present

## 2014-11-25 DIAGNOSIS — Z85828 Personal history of other malignant neoplasm of skin: Secondary | ICD-10-CM | POA: Diagnosis not present

## 2014-11-25 DIAGNOSIS — N529 Male erectile dysfunction, unspecified: Secondary | ICD-10-CM | POA: Diagnosis not present

## 2014-11-25 DIAGNOSIS — C61 Malignant neoplasm of prostate: Secondary | ICD-10-CM | POA: Diagnosis not present

## 2014-11-25 DIAGNOSIS — L570 Actinic keratosis: Secondary | ICD-10-CM | POA: Diagnosis not present

## 2015-01-03 ENCOUNTER — Other Ambulatory Visit: Payer: Self-pay | Admitting: Family Medicine

## 2015-01-12 ENCOUNTER — Other Ambulatory Visit: Payer: Self-pay | Admitting: Family Medicine

## 2015-03-17 ENCOUNTER — Other Ambulatory Visit: Payer: Self-pay | Admitting: Family Medicine

## 2015-04-01 ENCOUNTER — Other Ambulatory Visit: Payer: Self-pay | Admitting: Family Medicine

## 2015-04-10 ENCOUNTER — Other Ambulatory Visit: Payer: Self-pay | Admitting: Family Medicine

## 2015-05-14 DIAGNOSIS — C61 Malignant neoplasm of prostate: Secondary | ICD-10-CM | POA: Diagnosis not present

## 2015-05-25 ENCOUNTER — Other Ambulatory Visit: Payer: Self-pay | Admitting: Family Medicine

## 2015-05-25 NOTE — Telephone Encounter (Signed)
Last seen 9/16  Dr Livia Snellen

## 2015-05-25 NOTE — Telephone Encounter (Signed)
Authorize 30 days only. Then contact the patient letting them know that they will need an appointment before any further prescriptions can be sent in. 

## 2015-05-26 DIAGNOSIS — C61 Malignant neoplasm of prostate: Secondary | ICD-10-CM | POA: Diagnosis not present

## 2015-05-26 DIAGNOSIS — N529 Male erectile dysfunction, unspecified: Secondary | ICD-10-CM | POA: Diagnosis not present

## 2015-06-01 DIAGNOSIS — L570 Actinic keratosis: Secondary | ICD-10-CM | POA: Diagnosis not present

## 2015-06-01 DIAGNOSIS — Z85828 Personal history of other malignant neoplasm of skin: Secondary | ICD-10-CM | POA: Diagnosis not present

## 2015-06-01 DIAGNOSIS — D485 Neoplasm of uncertain behavior of skin: Secondary | ICD-10-CM | POA: Diagnosis not present

## 2015-06-13 ENCOUNTER — Other Ambulatory Visit: Payer: Self-pay | Admitting: Family Medicine

## 2015-06-15 NOTE — Telephone Encounter (Signed)
Patient last seen in 9-16. Was to return in 3 months. Please advise on 90 day refill

## 2015-06-23 ENCOUNTER — Other Ambulatory Visit: Payer: Self-pay | Admitting: Family Medicine

## 2015-06-28 ENCOUNTER — Other Ambulatory Visit: Payer: Self-pay | Admitting: Family Medicine

## 2015-06-29 NOTE — Telephone Encounter (Signed)
Last seen 10/31/14  Dr Livia Snellen   Requesting 90 day supply

## 2015-07-07 ENCOUNTER — Other Ambulatory Visit: Payer: Self-pay | Admitting: Family Medicine

## 2015-07-07 NOTE — Telephone Encounter (Signed)
Last seen 10/31/14  Dr Livia Snellen

## 2015-07-07 NOTE — Telephone Encounter (Signed)
Authorize 30 days only. Then contact the patient letting them know that they will need an appointment before any further prescriptions can be sent in. 

## 2015-07-27 ENCOUNTER — Telehealth: Payer: Self-pay | Admitting: Family Medicine

## 2015-07-27 NOTE — Telephone Encounter (Signed)
Spoke to pt

## 2015-08-20 ENCOUNTER — Ambulatory Visit: Payer: Medicare Other | Admitting: Pediatrics

## 2015-09-04 ENCOUNTER — Other Ambulatory Visit: Payer: Self-pay | Admitting: Family Medicine

## 2015-09-04 NOTE — Telephone Encounter (Signed)
Authorize 30 days only. Then contact the patient letting them know that they will need an appointment before any further prescriptions can be sent in. 

## 2015-09-09 ENCOUNTER — Encounter: Payer: Self-pay | Admitting: Family Medicine

## 2015-09-09 ENCOUNTER — Ambulatory Visit (INDEPENDENT_AMBULATORY_CARE_PROVIDER_SITE_OTHER): Payer: Medicare Other | Admitting: Family Medicine

## 2015-09-09 VITALS — BP 160/68 | HR 65 | Temp 97.1°F | Ht 68.0 in | Wt 217.6 lb

## 2015-09-09 DIAGNOSIS — M25561 Pain in right knee: Secondary | ICD-10-CM

## 2015-09-09 NOTE — Progress Notes (Signed)
Subjective:  Patient ID: Victor Journey., male    DOB: 1926/08/13  Age: 82 y.o. MRN: [id number]  CC: Knee Pain (Right x years); Diabetes; Hypertension; and Hyperlipidemia   HPI Victor Tapia. presents for Follow-up of diabetes. Patient does not check blood sugar at home Patient denies symptoms such as polyuria, polydipsia, excessive hunger, nausea No significant hypoglycemic spells noted. Medications as noted below. Taking them regularly without complication/adverse reaction being reported today.   follow-up of hypertension. Patient has no history of headache chest pain or shortness of breath or recent cough. Patient also denies symptoms of TIA such as numbness weakness lateralizing. Patient checks  blood pressure at home and has not had any elevated readings recently. Patient denies side effects from his medication. States taking it regularly.   Patient has history of significant injury in 1965 to the right knee. Recently he has had more more pain and it has become intolerable in spite of over-the-counter medications for analgesia. He would like to have a joint injection today.   History Victor Tapia has a past medical history of CAD (coronary artery disease); Diabetes mellitus; Hyperlipidemia; Hypertension; OA (osteoarthritis); and Prostate cancer ([place]) (1999).   He has a past surgical history that includes TRIPLE BY PASS (1999); CATARACT  SURGERY RIGHT EYE (4/10); and Knee surgery (Right).   His family history includes Cancer in his brother and sister.He reports that he has never smoked. He does not have any smokeless tobacco history on file. He reports that he drinks alcohol. He reports that he does not use drugs.    ROS Review of Systems  Constitutional: Negative for chills, diaphoresis, fever and unexpected weight change.  HENT: Negative for congestion, hearing loss, rhinorrhea and sore throat.   Eyes: Negative for visual disturbance.  Respiratory: Negative for cough and shortness  of breath.   Cardiovascular: Negative for chest pain.  Gastrointestinal: Negative for abdominal pain, constipation and diarrhea.  Genitourinary: Negative for dysuria and flank pain.  Musculoskeletal: Positive for arthralgias. Negative for joint swelling.  Skin: Negative for rash.  Neurological: Negative for dizziness and headaches.  Psychiatric/Behavioral: Negative for dysphoric mood and sleep disturbance.    Objective:  BP (!) 160/68 (BP Location: Left Arm, Patient Position: Sitting, Cuff Size: Large)   Pulse 65   Temp 97.1 F (36.2 C) (Oral)   Ht 5\' 8"  (1.727 m)   Wt 217 lb 9.6 oz (98.7 kg)   SpO2 98%   BMI 33.09 kg/m   BP Readings from Last 3 Encounters:  09/09/15 (!) 160/68  10/31/14 (!) 171/68  08/18/14 (!) 153/77    Wt Readings from Last 3 Encounters:  09/09/15 217 lb 9.6 oz (98.7 kg)  10/31/14 233 lb 12.8 oz (106.1 kg)  10/28/14 235 lb 3.2 oz (106.7 kg)     Physical Exam  Constitutional: He is oriented to person, place, and time. He appears well-developed and well-nourished. No distress.  HENT:  Head: Normocephalic and atraumatic.  Right Ear: External ear normal.  Left Ear: External ear normal.  Nose: Nose normal.  Mouth/Throat: Oropharynx is clear and moist.  Eyes: Conjunctivae and EOM are normal. Pupils are equal, round, and reactive to light.  Neck: Normal range of motion. Neck supple. No thyromegaly present.  Cardiovascular: Normal rate, regular rhythm and normal heart sounds.   No murmur heard. Pulmonary/Chest: Effort normal and breath sounds normal. No respiratory distress. He has no wheezes. He has no rales.  Abdominal: Soft. Bowel sounds are normal. He exhibits no distension. There  is no tenderness.  Musculoskeletal: He exhibits edema (right knee) and tenderness (right knee).  Lymphadenopathy:    He has no cervical adenopathy.  Neurological: He is alert and oriented to person, place, and time. He has normal reflexes.  Skin: Skin is warm and dry.    Psychiatric: He has a normal mood and affect. His behavior is normal. Judgment and thought content normal.     Lab Results  Component Value Date   WBC 8.7 10/31/2014   WBC CANCELED 10/31/2014   HGB 12.5 (A) 01/24/2014   HCT 34.8 (L) 10/31/2014   HCT CANCELED 10/31/2014   PLT 222 10/31/2014   PLT CANCELED 10/31/2014   GLUCOSE 109 (H) 10/31/2014   CHOL 119 10/31/2014   TRIG 126 10/31/2014   HDL 46 10/31/2014   LDLCALC 48 10/31/2014   ALT 28 10/31/2014   AST 21 10/31/2014   NA 145 (H) 10/31/2014   K 5.2 10/31/2014   CL 104 10/31/2014   CREATININE 1.65 (H) 10/31/2014   BUN 29 (H) 10/31/2014   CO2 27 10/31/2014   TSH 1.560 03/07/2013   PSA <0.1 01/24/2014   HGBA1C 6.6 10/31/2014    X-ray right knee-severe tricompartmental arthritis. No acute injury.   Assessment & Plan:   Victor Tapia was seen today for knee pain, diabetes, hypertension and hyperlipidemia.  Diagnoses and all orders for this visit:  Pain in joint, lower leg, right -     DG Knee 1-2 Views Right; Future    A steroid injection was performed at right lateral knee joint using 3 cc marcan and 9 mg of Celestone. This was well tolerated.   I have discontinued Victor Tapia predniSONE, diclofenac, and diclofenac. I am also having him maintain his aspirin, Multiple Vitamins-Minerals (MENS 50+ MULTI VITAMIN/MIN PO), Fluvastatin Sodium (LESCOL PO), pioglitazone, JANUVIA, TOPROL XL, amLODipine, and atorvastatin.  No orders of the defined types were placed in this encounter.    Follow-up: Return in about 3 months (around 12/10/2015).  Claretta Fraise, M.D.

## 2015-09-10 ENCOUNTER — Other Ambulatory Visit (INDEPENDENT_AMBULATORY_CARE_PROVIDER_SITE_OTHER): Payer: Medicare Other

## 2015-09-10 DIAGNOSIS — M25561 Pain in right knee: Secondary | ICD-10-CM

## 2015-09-27 ENCOUNTER — Other Ambulatory Visit: Payer: Self-pay | Admitting: Family Medicine

## 2015-10-05 ENCOUNTER — Other Ambulatory Visit: Payer: Self-pay | Admitting: Family Medicine

## 2015-10-06 NOTE — Telephone Encounter (Signed)
Last follow up and last labs 10/31/14. Please advise

## 2015-10-06 NOTE — Telephone Encounter (Signed)
Authorize 30 days only. Then contact the patient letting them know that they will need an appointment before any further prescriptions can be sent in. 

## 2015-11-12 DIAGNOSIS — Z23 Encounter for immunization: Secondary | ICD-10-CM | POA: Diagnosis not present

## 2015-12-03 ENCOUNTER — Other Ambulatory Visit: Payer: Self-pay | Admitting: Family Medicine

## 2015-12-04 NOTE — Telephone Encounter (Signed)
Please contact the patient Needs A1c, lipid & CMP drawn before scrip can be approved.

## 2015-12-04 NOTE — Telephone Encounter (Signed)
Appointment scheduled for lab work and office visit.

## 2015-12-08 ENCOUNTER — Encounter: Payer: Self-pay | Admitting: Family Medicine

## 2015-12-08 ENCOUNTER — Ambulatory Visit (INDEPENDENT_AMBULATORY_CARE_PROVIDER_SITE_OTHER): Payer: Medicare Other | Admitting: Family Medicine

## 2015-12-08 VITALS — BP 138/67 | HR 69 | Temp 97.9°F | Ht 68.0 in | Wt 220.0 lb

## 2015-12-08 DIAGNOSIS — N183 Chronic kidney disease, stage 3 unspecified: Secondary | ICD-10-CM

## 2015-12-08 DIAGNOSIS — E782 Mixed hyperlipidemia: Secondary | ICD-10-CM | POA: Diagnosis not present

## 2015-12-08 DIAGNOSIS — C61 Malignant neoplasm of prostate: Secondary | ICD-10-CM | POA: Diagnosis not present

## 2015-12-08 DIAGNOSIS — E1122 Type 2 diabetes mellitus with diabetic chronic kidney disease: Secondary | ICD-10-CM | POA: Diagnosis not present

## 2015-12-08 DIAGNOSIS — I10 Essential (primary) hypertension: Secondary | ICD-10-CM | POA: Diagnosis not present

## 2015-12-08 LAB — BAYER DCA HB A1C WAIVED: HB A1C (BAYER DCA - WAIVED): 7.7 % — ABNORMAL HIGH (ref <7.0)

## 2015-12-08 NOTE — Progress Notes (Signed)
Subjective:  Patient ID: Victor Tapia., male    DOB: Aug 06, 1926  Age: 82 y.o. MRN: [id number]  CC: Diabetes (pt here today for routine follow up on his diabetes, no problems voiced)   HPI Victor Tapia. presents for  follow-up of hypertension. Patient has no history of headache chest pain or shortness of breath or recent cough. Patient also denies symptoms of TIA such as numbness weakness lateralizing. Patient checks  blood pressure at home and has not had any elevated readings recently. Patient denies side effects from his medication. States taking it regularly.  Patient also  in for follow-up of elevated cholesterol. Doing well without complaints on current medication. Denies side effects of statin including myalgia and arthralgia and nausea. Also in today for liver function testing. Currently no chest pain, shortness of breath or other cardiovascular related symptoms noted.  Follow-up of diabetes. Patient does check blood sugar at home. Readings run between 135 and  140. Patient denies symptoms such as polyuria, polydipsia, excessive hunger, nausea No significant hypoglycemic spells noted. Medications as noted below. Taking them regularly without complication/adverse reaction being reported today.   Seeing urology for Prostatic Ca. Due for PSA. Needs result sent to Dr. Exie Parody, [facility]. History [name] has a past medical history of CAD (coronary artery disease); Diabetes mellitus; Hyperlipidemia; Hypertension; OA (osteoarthritis); and Prostate cancer ([place]) (1999).   He has a past surgical history that includes TRIPLE BY PASS (1999); CATARACT  SURGERY RIGHT EYE (4/10); and Knee surgery (Right).   His family history includes Cancer in his brother and sister.He reports that he has never smoked. He has never used smokeless tobacco. He reports that he drinks alcohol. He reports that he does not use drugs.  Current Outpatient Prescriptions on File Prior to Visit  Medication Sig Dispense  Refill  . amLODipine (NORVASC) 5 MG tablet TAKE 1 TABLET DAILY (MUST BE SEEN BEFORE NEXT REFILL) 30 tablet 0  . aspirin 325 MG tablet Take 325 mg by mouth daily.      Marland Kitchen atorvastatin (LIPITOR) 20 MG tablet TAKE 1 TABLET DAILY (NEED TO BE SEEN BEFORE NEXT REFILL) 90 tablet 0  . JANUVIA 100 MG tablet TAKE 1 TABLET DAILY 90 tablet 3  . Multiple Vitamins-Minerals (MENS 50+ MULTI VITAMIN/MIN PO) Take by mouth.    . TOPROL XL 25 MG 24 hr tablet TAKE 1 TABLET DAILY 90 tablet 0   No current facility-administered medications on file prior to visit.     ROS Review of Systems  Constitutional: Negative for chills, diaphoresis, fever and unexpected weight change.  HENT: Negative for congestion, hearing loss, rhinorrhea and sore throat.   Eyes: Negative for visual disturbance.  Respiratory: Negative for cough and shortness of breath.   Cardiovascular: Negative for chest pain.  Gastrointestinal: Negative for abdominal pain, constipation and diarrhea.  Genitourinary: Negative for dysuria and flank pain.  Musculoskeletal: Negative for arthralgias and joint swelling.  Skin: Negative for rash.  Neurological: Negative for dizziness and headaches.  Psychiatric/Behavioral: Negative for dysphoric mood and sleep disturbance.    Objective:  BP 138/67   Pulse 69   Temp 97.9 F (36.6 C) (Oral)   Ht '5\' 8"'$  (1.727 m)   Wt 220 lb (99.8 kg)   BMI 33.45 kg/m   BP Readings from Last 3 Encounters:  12/08/15 138/67  09/09/15 (!) 160/68  10/31/14 (!) 171/68    Wt Readings from Last 3 Encounters:  12/08/15 220 lb (99.8 kg)  09/09/15 217 lb 9.6 oz (  98.7 kg)  10/31/14 233 lb 12.8 oz (106.1 kg)     Physical Exam  Constitutional: He is oriented to person, place, and time. He appears well-developed and well-nourished. No distress.  HENT:  Head: Normocephalic and atraumatic.  Right Ear: External ear normal.  Left Ear: External ear normal.  Nose: Nose normal.  Mouth/Throat: Oropharynx is clear and moist.    Eyes: Conjunctivae and EOM are normal. Pupils are equal, round, and reactive to light.  Neck: Normal range of motion. Neck supple. No thyromegaly present.  Cardiovascular: Normal rate, regular rhythm and normal heart sounds.   No murmur heard. Pulmonary/Chest: Effort normal and breath sounds normal. No respiratory distress. He has no wheezes. He has no rales.  Abdominal: Soft. Bowel sounds are normal. He exhibits no distension. There is no tenderness.  Lymphadenopathy:    He has no cervical adenopathy.  Neurological: He is alert and oriented to person, place, and time. He has normal reflexes.  Skin: Skin is warm and dry.  Psychiatric: He has a normal mood and affect. His behavior is normal. Judgment and thought content normal.    Lab Results  Component Value Date   HGBA1C 6.6 10/31/2014   HGBA1C 6.8% 01/24/2014   HGBA1C 6.6 09/04/2013    Lab Results  Component Value Date   WBC 8.7 10/31/2014   WBC CANCELED 10/31/2014   HGB 12.5 (A) 01/24/2014   HCT 34.8 (L) 10/31/2014   HCT CANCELED 10/31/2014   PLT 222 10/31/2014   PLT CANCELED 10/31/2014   GLUCOSE 109 (H) 10/31/2014   CHOL 119 10/31/2014   TRIG 126 10/31/2014   HDL 46 10/31/2014   LDLCALC 48 10/31/2014   ALT 28 10/31/2014   AST 21 10/31/2014   NA 145 (H) 10/31/2014   K 5.2 10/31/2014   CL 104 10/31/2014   CREATININE 1.65 (H) 10/31/2014   BUN 29 (H) 10/31/2014   CO2 27 10/31/2014   TSH 1.560 03/07/2013   PSA <0.1 01/24/2014   HGBA1C 6.6 10/31/2014    US Venous Img Lower Unilateral Right  Result Date: 10/28/2014 CLINICAL DATA:  Right lower extremity swelling and pain. EXAM: RIGHT LOWER EXTREMITY VENOUS DOPPLER ULTRASOUND TECHNIQUE: Gray-scale sonography with graded compression, as well as color Doppler and duplex ultrasound were performed to evaluate the lower extremity deep venous systems from the level of the common femoral vein and including the common femoral, femoral, profunda femoral, popliteal and calf veins  including the posterior tibial, peroneal and gastrocnemius veins when visible. The superficial great saphenous vein was also interrogated. Spectral Doppler was utilized to evaluate flow at rest and with distal augmentation maneuvers in the common femoral, femoral and popliteal veins. COMPARISON:  None. FINDINGS: Contralateral Common Femoral Vein: Respiratory phasicity is normal and symmetric with the symptomatic side. No evidence of thrombus. Normal compressibility. Common Femoral Vein: No evidence of thrombus. Normal compressibility, respiratory phasicity and response to augmentation. Saphenofemoral Junction: No evidence of thrombus. Normal compressibility and flow on color Doppler imaging. Profunda Femoral Vein: No evidence of thrombus. Normal compressibility and flow on color Doppler imaging. Femoral Vein: No evidence of thrombus. Normal compressibility, respiratory phasicity and response to augmentation. Popliteal Vein: No evidence of thrombus. Normal compressibility, respiratory phasicity and response to augmentation. Calf Veins: No evidence of thrombus. Normal compressibility and flow on color Doppler imaging. Superficial Great Saphenous Vein: No evidence of thrombus. Normal compressibility and flow on color Doppler imaging. Venous Reflux:  None. Other Findings:  None. IMPRESSION: No evidence of deep venous thrombosis. Electronically  Signed   By: Abelardo Diesel M.D.   On: 10/28/2014 14:51    Assessment & Plan:   Eldrige was seen today for diabetes.  Diagnoses and all orders for this visit:  Type 2 diabetes mellitus with stage 3 chronic kidney disease, without long-term current use of insulin (HCC) -     Bayer DCA Hb A1c Waived -     Microalbumin / creatinine urine ratio -     CMP14+EGFR -     Lipid panel  Mixed hyperlipidemia -     CMP14+EGFR -     Lipid panel  Essential hypertension, benign -     CMP14+EGFR  Prostate CA (HCC) -     PSA Total (Reflex To Free)   I have discontinued Mr.  Doolittle Fluvastatin Sodium (LESCOL PO), pioglitazone, FLUZONE HIGH-DOSE, and PNEUMOVAX 23. I am also having him maintain his aspirin, Multiple Vitamins-Minerals (MENS 50+ MULTI VITAMIN/MIN PO), JANUVIA, atorvastatin, TOPROL XL, and amLODipine.  Meds ordered this encounter  Medications  . DISCONTD: FLUZONE HIGH-DOSE 0.5 ML SUSY    Sig: TO BE ADMINISTERED BY PHARMACIST FOR IMMUNIZATION    Refill:  0  . DISCONTD: PNEUMOVAX 23 25 MCG/0.5ML injection    Sig: TO BE ADMINISTERED BY PHARMACIST FOR IMMUNIZATION    Refill:  0     Follow-up: Return in about 3 months (around 03/09/2016).  Claretta Fraise, M.D.

## 2015-12-09 ENCOUNTER — Other Ambulatory Visit: Payer: Self-pay | Admitting: Family Medicine

## 2015-12-09 ENCOUNTER — Telehealth: Payer: Self-pay | Admitting: Family Medicine

## 2015-12-09 DIAGNOSIS — E875 Hyperkalemia: Secondary | ICD-10-CM

## 2015-12-09 LAB — CMP14+EGFR
ALK PHOS: 118 IU/L — AB (ref 39–117)
ALT: 18 IU/L (ref 0–44)
AST: 26 IU/L (ref 0–40)
Albumin/Globulin Ratio: 1.2 (ref 1.2–2.2)
Albumin: 4 g/dL (ref 3.5–4.7)
BUN/Creatinine Ratio: 16 (ref 10–24)
BUN: 23 mg/dL (ref 8–27)
Bilirubin Total: 0.4 mg/dL (ref 0.0–1.2)
CALCIUM: 9.7 mg/dL (ref 8.6–10.2)
CO2: 24 mmol/L (ref 18–29)
CREATININE: 1.42 mg/dL — AB (ref 0.76–1.27)
Chloride: 99 mmol/L (ref 96–106)
GFR calc Af Amer: 51 mL/min/{1.73_m2} — ABNORMAL LOW (ref >59)
GFR calc non Af Amer: 44 mL/min/{1.73_m2} — ABNORMAL LOW (ref >59)
GLUCOSE: 167 mg/dL — AB (ref 65–99)
Globulin, Total: 3.3 g/dL (ref 1.5–4.5)
Potassium: 5.6 mmol/L — ABNORMAL HIGH (ref 3.5–5.2)
Sodium: 142 mmol/L (ref 134–144)
Total Protein: 7.3 g/dL (ref 6.0–8.5)

## 2015-12-09 LAB — LIPID PANEL
CHOLESTEROL TOTAL: 148 mg/dL (ref 100–199)
Chol/HDL Ratio: 2.9 ratio units (ref 0.0–5.0)
HDL: 51 mg/dL (ref >39)
LDL CALC: 66 mg/dL (ref 0–99)
TRIGLYCERIDES: 157 mg/dL — AB (ref 0–149)
VLDL CHOLESTEROL CAL: 31 mg/dL (ref 5–40)

## 2015-12-09 LAB — PSA TOTAL (REFLEX TO FREE): Prostate Specific Ag, Serum: 0.1 ng/mL (ref 0.0–4.0)

## 2015-12-09 NOTE — Telephone Encounter (Signed)
Patient aware of lab result

## 2015-12-11 ENCOUNTER — Other Ambulatory Visit (INDEPENDENT_AMBULATORY_CARE_PROVIDER_SITE_OTHER): Payer: Medicare Other

## 2015-12-11 DIAGNOSIS — E875 Hyperkalemia: Secondary | ICD-10-CM

## 2015-12-12 LAB — BMP8+EGFR
BUN/Creatinine Ratio: 13 (ref 10–24)
BUN: 18 mg/dL (ref 8–27)
CALCIUM: 9.5 mg/dL (ref 8.6–10.2)
CHLORIDE: 99 mmol/L (ref 96–106)
CO2: 30 mmol/L — ABNORMAL HIGH (ref 18–29)
CREATININE: 1.42 mg/dL — AB (ref 0.76–1.27)
GFR calc non Af Amer: 44 mL/min/{1.73_m2} — ABNORMAL LOW (ref >59)
GFR, EST AFRICAN AMERICAN: 51 mL/min/{1.73_m2} — AB (ref >59)
Glucose: 211 mg/dL — ABNORMAL HIGH (ref 65–99)
Potassium: 5.2 mmol/L (ref 3.5–5.2)
Sodium: 142 mmol/L (ref 134–144)

## 2015-12-27 ENCOUNTER — Other Ambulatory Visit: Payer: Self-pay | Admitting: Family Medicine

## 2016-01-05 ENCOUNTER — Other Ambulatory Visit: Payer: Self-pay | Admitting: Family Medicine

## 2016-01-05 MED ORDER — ATORVASTATIN CALCIUM 20 MG PO TABS: ORAL_TABLET | ORAL | 1 refills | Status: DC

## 2016-01-05 NOTE — Telephone Encounter (Signed)
done

## 2016-01-19 ENCOUNTER — Other Ambulatory Visit: Payer: Self-pay | Admitting: Family Medicine

## 2016-03-09 ENCOUNTER — Ambulatory Visit (INDEPENDENT_AMBULATORY_CARE_PROVIDER_SITE_OTHER): Payer: Medicare Other | Admitting: Family Medicine

## 2016-03-09 ENCOUNTER — Encounter: Payer: Self-pay | Admitting: Family Medicine

## 2016-03-09 VITALS — BP 134/69 | HR 61 | Temp 98.4°F | Ht 68.0 in | Wt 219.0 lb

## 2016-03-09 DIAGNOSIS — N183 Chronic kidney disease, stage 3 unspecified: Secondary | ICD-10-CM

## 2016-03-09 DIAGNOSIS — C61 Malignant neoplasm of prostate: Secondary | ICD-10-CM

## 2016-03-09 DIAGNOSIS — E1122 Type 2 diabetes mellitus with diabetic chronic kidney disease: Secondary | ICD-10-CM | POA: Diagnosis not present

## 2016-03-09 DIAGNOSIS — I10 Essential (primary) hypertension: Secondary | ICD-10-CM

## 2016-03-09 DIAGNOSIS — E782 Mixed hyperlipidemia: Secondary | ICD-10-CM | POA: Diagnosis not present

## 2016-03-09 LAB — URINALYSIS
BILIRUBIN UA: NEGATIVE
KETONES UA: NEGATIVE
LEUKOCYTES UA: NEGATIVE
NITRITE UA: NEGATIVE
Protein, UA: NEGATIVE
SPEC GRAV UA: 1.015 (ref 1.005–1.030)
UUROB: 1 mg/dL (ref 0.2–1.0)
pH, UA: 6 (ref 5.0–7.5)

## 2016-03-09 LAB — BAYER DCA HB A1C WAIVED: HB A1C: 9.5 % — AB (ref <7.0)

## 2016-03-09 NOTE — Progress Notes (Signed)
Subjective:  Patient ID: Victor Tapia., male    DOB: [birth year]  Age: 83 y.o. MRN: [id number]  CC: Diabetes (pt here today for routine follow up of his diabetes and cholesterol, no other concerns voiced at this time.)   HPI Victor Tapia. presents for  follow-up of hypertension. Patient has no history of headache chest pain or shortness of breath or recent cough. Patient also denies symptoms of TIA such as numbness weakness lateralizing. Patient checks  blood pressure at home and has not had any elevated readings recently. Patient denies side effects from his medication. States taking it regularly.  Patient also  in for follow-up of elevated cholesterol. Doing well without complaints on current medication. Denies side effects of statin including myalgia and arthralgia and nausea. Also in today for liver function testing. Currently no chest pain, shortness of breath or other cardiovascular related symptoms noted.  Follow-up of diabetes. Patient does not check blood sugar at home. Patient denies symptoms such as polyuria, polydipsia, excessive hunger, nausea No significant hypoglycemic spells noted. Medications as noted below. Taking them regularly without complication/adverse reaction being reported today.    History Victor Tapia has a past medical history of CAD (coronary artery disease); Diabetes mellitus; Hyperlipidemia; Hypertension; OA (osteoarthritis); and Prostate cancer ([place]) (1999).   He has a past surgical history that includes TRIPLE BY PASS (1999); CATARACT  SURGERY RIGHT EYE (4/10); and Knee surgery (Right).   His family history includes Cancer in his brother and sister.He reports that he has never smoked. He has never used smokeless tobacco. He reports that he drinks alcohol. He reports that he does not use drugs.  Current Outpatient Prescriptions on File Prior to Visit  Medication Sig Dispense Refill  . amLODipine (NORVASC) 5 MG tablet TAKE 1 TABLET DAILY (MUST BE SEEN BEFORE  NEXT REFILL) 30 tablet 2  . aspirin 325 MG tablet Take 325 mg by mouth daily.      Marland Kitchen atorvastatin (LIPITOR) 20 MG tablet TAKE 1 TABLET DAILY 90 tablet 1  . JANUVIA 100 MG tablet TAKE 1 TABLET DAILY 90 tablet 3  . Multiple Vitamins-Minerals (MENS 50+ MULTI VITAMIN/MIN PO) Take by mouth.    . TOPROL XL 25 MG 24 hr tablet TAKE 1 TABLET DAILY 90 tablet 1   No current facility-administered medications on file prior to visit.     ROS Review of Systems  Constitutional: Negative for chills, diaphoresis, fever and unexpected weight change.  HENT: Negative for congestion, hearing loss, rhinorrhea and sore throat.   Eyes: Negative for visual disturbance.  Respiratory: Negative for cough and shortness of breath.   Cardiovascular: Negative for chest pain.  Gastrointestinal: Negative for abdominal pain, constipation and diarrhea.  Genitourinary: Negative for dysuria and flank pain.  Musculoskeletal: Negative for arthralgias and joint swelling.  Skin: Negative for rash.  Neurological: Negative for dizziness and headaches.  Psychiatric/Behavioral: Negative for dysphoric mood and sleep disturbance.    Objective:  BP 134/69   Pulse 61   Temp 98.4 F (36.9 C) (Oral)   Ht 5' 8"  (1.727 m)   Wt 219 lb (99.3 kg)   BMI 33.30 kg/m   BP Readings from Last 3 Encounters:  03/09/16 134/69  12/08/15 138/67  09/09/15 (!) 160/68    Wt Readings from Last 3 Encounters:  03/09/16 219 lb (99.3 kg)  12/08/15 220 lb (99.8 kg)  09/09/15 217 lb 9.6 oz (98.7 kg)     Physical Exam  Constitutional: He is oriented to person, place, and  time. He appears well-developed and well-nourished. No distress.  HENT:  Head: Normocephalic and atraumatic.  Right Ear: External ear normal.  Left Ear: External ear normal.  Nose: Nose normal.  Mouth/Throat: Oropharynx is clear and moist.  Eyes: Conjunctivae and EOM are normal. Pupils are equal, round, and reactive to light.  Neck: Normal range of motion. Neck supple. No  thyromegaly present.  Cardiovascular: Normal rate, regular rhythm and normal heart sounds.   No murmur heard. Pulmonary/Chest: Effort normal and breath sounds normal. No respiratory distress. He has no wheezes. He has no rales.  Abdominal: Soft. Bowel sounds are normal. He exhibits no distension. There is no tenderness.  Musculoskeletal: He exhibits tenderness.  Some persistent low back pain in the lumbar paraspinous region. This does not radiate. It wasn't relieved by Aleve. Ongoing for about a week and now getting better. Feels stiff in the mornings.  Lymphadenopathy:    He has no cervical adenopathy.  Neurological: He is alert and oriented to person, place, and time. He has normal reflexes.  Skin: Skin is warm and dry.  Psychiatric: He has a normal mood and affect. His behavior is normal. Judgment and thought content normal.     Assessment & Plan:   Victor Tapia was seen today for diabetes.  Diagnoses and all orders for this visit:  Mixed hyperlipidemia -     CMP14+EGFR  Essential hypertension, benign -     CMP14+EGFR  Type 2 diabetes mellitus with stage 3 chronic kidney disease, without long-term current use of insulin (HCC) -     Microalbumin / creatinine urine ratio -     Urinalysis -     Bayer DCA Hb A1c Waived -     Lipid panel -     CMP14+EGFR  Prostate CA (HCC) -     PSA Total (Reflex To Free)   I have discontinued Mr. Wiebelhaus diclofenac and predniSONE. I am also having him maintain his aspirin, Multiple Vitamins-Minerals (MENS 50+ MULTI VITAMIN/MIN PO), JANUVIA, TOPROL XL, atorvastatin, and amLODipine.  No orders of the defined types were placed in this encounter.  Discussed limiting use of NSAIDs due to stage III renal insufficiency.  Follow-up: Return in about 3 months (around 06/06/2016).  Claretta Fraise, M.D.

## 2016-03-10 LAB — CMP14+EGFR
A/G RATIO: 1.4 (ref 1.2–2.2)
ALT: 16 IU/L (ref 0–44)
AST: 18 IU/L (ref 0–40)
Albumin: 3.9 g/dL (ref 3.5–4.7)
Alkaline Phosphatase: 112 IU/L (ref 39–117)
BUN/Creatinine Ratio: 14 (ref 10–24)
BUN: 27 mg/dL (ref 8–27)
Bilirubin Total: 0.5 mg/dL (ref 0.0–1.2)
CALCIUM: 9.4 mg/dL (ref 8.6–10.2)
CO2: 26 mmol/L (ref 18–29)
Chloride: 98 mmol/L (ref 96–106)
Creatinine, Ser: 1.91 mg/dL — ABNORMAL HIGH (ref 0.76–1.27)
GFR, EST AFRICAN AMERICAN: 36 mL/min/{1.73_m2} — AB (ref >59)
GFR, EST NON AFRICAN AMERICAN: 31 mL/min/{1.73_m2} — AB (ref >59)
Globulin, Total: 2.8 g/dL (ref 1.5–4.5)
Glucose: 238 mg/dL — ABNORMAL HIGH (ref 65–99)
POTASSIUM: 5.4 mmol/L — AB (ref 3.5–5.2)
Sodium: 140 mmol/L (ref 134–144)
TOTAL PROTEIN: 6.7 g/dL (ref 6.0–8.5)

## 2016-03-10 LAB — PSA TOTAL (REFLEX TO FREE): Prostate Specific Ag, Serum: 0.1 ng/mL (ref 0.0–4.0)

## 2016-03-10 LAB — MICROALBUMIN / CREATININE URINE RATIO
Creatinine, Urine: 194 mg/dL
Microalb/Creat Ratio: 8.9 mg/g creat (ref 0.0–30.0)
Microalbumin, Urine: 17.3 ug/mL

## 2016-03-10 LAB — LIPID PANEL
CHOL/HDL RATIO: 2.9 ratio (ref 0.0–5.0)
Cholesterol, Total: 127 mg/dL (ref 100–199)
HDL: 44 mg/dL (ref >39)
LDL Calculated: 53 mg/dL (ref 0–99)
TRIGLYCERIDES: 152 mg/dL — AB (ref 0–149)
VLDL Cholesterol Cal: 30 mg/dL (ref 5–40)

## 2016-04-13 ENCOUNTER — Encounter: Payer: Self-pay | Admitting: *Deleted

## 2016-05-02 ENCOUNTER — Other Ambulatory Visit: Payer: Self-pay | Admitting: *Deleted

## 2016-05-02 MED ORDER — AMLODIPINE BESYLATE 5 MG PO TABS: ORAL_TABLET | ORAL | 0 refills | Status: DC

## 2016-06-06 ENCOUNTER — Encounter: Payer: Self-pay | Admitting: Family Medicine

## 2016-06-06 ENCOUNTER — Ambulatory Visit (INDEPENDENT_AMBULATORY_CARE_PROVIDER_SITE_OTHER): Payer: Medicare Other | Admitting: Family Medicine

## 2016-06-06 VITALS — BP 129/63 | HR 68 | Temp 98.2°F | Ht 68.0 in | Wt 220.0 lb

## 2016-06-06 DIAGNOSIS — I10 Essential (primary) hypertension: Secondary | ICD-10-CM

## 2016-06-06 DIAGNOSIS — E782 Mixed hyperlipidemia: Secondary | ICD-10-CM | POA: Diagnosis not present

## 2016-06-06 DIAGNOSIS — E1122 Type 2 diabetes mellitus with diabetic chronic kidney disease: Secondary | ICD-10-CM

## 2016-06-06 DIAGNOSIS — N183 Chronic kidney disease, stage 3 unspecified: Secondary | ICD-10-CM

## 2016-06-06 LAB — URINALYSIS
Bilirubin, UA: NEGATIVE
Leukocytes, UA: NEGATIVE
NITRITE UA: NEGATIVE
Specific Gravity, UA: 1.025 (ref 1.005–1.030)
UUROB: 0.2 mg/dL (ref 0.2–1.0)
pH, UA: 5 (ref 5.0–7.5)

## 2016-06-06 LAB — BAYER DCA HB A1C WAIVED: HB A1C: 9.1 % — AB (ref <7.0)

## 2016-06-06 NOTE — Progress Notes (Signed)
Subjective:  Patient ID: Victor Journey., male    DOB: 11-25-1926  Age: 83 y.o. MRN: [id number]  CC: Hyperlipidemia (pt here today for routine follow up on his chronic medical conditions. No other concerns voiced.)   HPI Victor Tapia. presents for  follow-up of hypertension. Patient has no history of headache chest pain or shortness of breath or recent cough. Patient also denies symptoms of TIA such as numbness weakness lateralizing. Patient checks  blood pressure at home. Recent readings have been good Patient denies side effects from medication. States taking it regularly.  Patient also  in for follow-up of elevated cholesterol. Doing well without complaints on current medication. Denies side effects of statin including myalgia and arthralgia and nausea. Also in today for liver function testing. Currently no chest pain, shortness of breath or other cardiovascular related symptoms noted.  Follow-up of diabetes. Patient does not monitor glucose. Trying to exercise more. Eating better. Willing to try  GLP1 if needed. Patient denies symptoms such as polyuria, polydipsia, excessive hunger, nausea No significant hypoglycemic spells noted. Medications reviewed. Pt reports taking them regularly. Pt. denies complication/adverse reaction today.    History Victor Tapia has a past medical history of CAD (coronary artery disease); Diabetes mellitus; Hyperlipidemia; Hypertension; OA (osteoarthritis); and Prostate cancer ([place]) (1999).   He has a past surgical history that includes TRIPLE BY PASS (1999); CATARACT  SURGERY RIGHT EYE (4/10); and Knee surgery ([place]).   His family history includes Cancer in his brother and sister.He reports that he has never smoked. He has never used smokeless tobacco. He reports that he drinks alcohol. He reports that he does not use drugs.  Current Outpatient Prescriptions on File Prior to Visit  Medication Sig Dispense Refill  . amLODipine (NORVASC) 5 MG tablet TAKE 1  TABLET DAILY (MUST BE SEEN BEFORE NEXT REFILL) 90 tablet 0  . aspirin 325 MG tablet Take 325 mg by mouth daily.      Marland Kitchen atorvastatin (LIPITOR) 20 MG tablet TAKE 1 TABLET DAILY 90 tablet 1  . JANUVIA 100 MG tablet TAKE 1 TABLET DAILY 90 tablet 3  . Multiple Vitamins-Minerals (MENS 50+ MULTI VITAMIN/MIN PO) Take by mouth.    . TOPROL XL 25 MG 24 hr tablet TAKE 1 TABLET DAILY 90 tablet 1   No current facility-administered medications on file prior to visit.     ROS Review of Systems  Constitutional: Negative for chills, diaphoresis, fever and unexpected weight change.  HENT: Negative for congestion, hearing loss, rhinorrhea and sore throat.   Eyes: Negative for visual disturbance.  Respiratory: Negative for cough and shortness of breath.   Cardiovascular: Negative for chest pain.  Gastrointestinal: Negative for abdominal pain, constipation and diarrhea.  Genitourinary: Negative for dysuria and flank pain.  Musculoskeletal: Negative for arthralgias and joint swelling.  Skin: Negative for rash.  Neurological: Negative for dizziness and headaches.  Psychiatric/Behavioral: Negative for dysphoric mood and sleep disturbance.    Objective:  BP (!) 148/60   Pulse 68   Temp 98.2 F (36.8 C) (Oral)   Ht 5' 8"  (1.727 m)   Wt 220 lb (99.8 kg)   BMI 33.45 kg/m   BP Readings from Last 3 Encounters:  06/06/16 (!) 148/60  03/09/16 134/69  12/08/15 138/67    Wt Readings from Last 3 Encounters:  06/06/16 220 lb (99.8 kg)  03/09/16 219 lb (99.3 kg)  12/08/15 220 lb (99.8 kg)     Physical Exam  Constitutional: He is oriented to person, place,  and time. He appears well-developed and well-nourished. No distress.  HENT:  Head: Normocephalic and atraumatic.  Right Ear: External ear normal.  Left Ear: External ear normal.  Nose: Nose normal.  Mouth/Throat: Oropharynx is clear and moist.  Eyes: Conjunctivae and EOM are normal. Pupils are equal, round, and reactive to light.  Neck: Normal  range of motion. Neck supple. No thyromegaly present.  Cardiovascular: Normal rate, regular rhythm and normal heart sounds.   No murmur heard. Pulmonary/Chest: Effort normal and breath sounds normal. No respiratory distress. He has no wheezes. He has no rales.  Abdominal: Soft. Bowel sounds are normal. He exhibits no distension. There is no tenderness.  Lymphadenopathy:    He has no cervical adenopathy.  Neurological: He is alert and oriented to person, place, and time. He has normal reflexes.  Skin: Skin is warm and dry.  Psychiatric: He has a normal mood and affect. His behavior is normal. Judgment and thought content normal.   Diabetic Foot Exam - Simple   Simple Foot Form Diabetic Foot exam was performed with the following findings:  Yes 06/06/2016  9:16 AM  Visual Inspection No deformities, no ulcerations, no other skin breakdown bilaterally:  Yes Sensation Testing Intact to touch and monofilament testing bilaterally:  Yes Pulse Check Posterior Tibialis and Dorsalis pulse intact bilaterally:  Yes Comments          Assessment & Plan:   Cormac was seen today for hyperlipidemia.  Diagnoses and all orders for this visit:  Mixed hyperlipidemia  Essential hypertension, benign  Type 2 diabetes mellitus with stage 3 chronic kidney disease, without long-term current use of insulin (HCC) -     Microalbumin / creatinine urine ratio -     Urinalysis -     Bayer DCA Hb A1c Waived -     CMP14+EGFR   I am having Mr. Gallentine maintain his aspirin, Multiple Vitamins-Minerals (MENS 50+ MULTI VITAMIN/MIN PO), JANUVIA, TOPROL XL, atorvastatin, and amLODipine.   Patient is open to adding injectable GLP-1 and/or insulin. Due to stage III renal disease our oral options are limited. Additionally patient was counseled regarding proper nutrition and exercise for weight management as well as controlling his A1c.  Follow-up: Return in about 3 months (around 09/05/2016) for diabetes,  hypertension, cholesterol.  Claretta Fraise, M.D.

## 2016-06-07 ENCOUNTER — Other Ambulatory Visit: Payer: Self-pay | Admitting: Family Medicine

## 2016-06-07 LAB — CMP14+EGFR
A/G RATIO: 1.4 (ref 1.2–2.2)
ALBUMIN: 3.8 g/dL (ref 3.5–4.7)
ALT: 20 IU/L (ref 0–44)
AST: 23 IU/L (ref 0–40)
Alkaline Phosphatase: 121 IU/L — ABNORMAL HIGH (ref 39–117)
BUN/Creatinine Ratio: 14 (ref 10–24)
BUN: 21 mg/dL (ref 8–27)
Bilirubin Total: 0.4 mg/dL (ref 0.0–1.2)
CALCIUM: 9.8 mg/dL (ref 8.6–10.2)
CO2: 29 mmol/L (ref 18–29)
Chloride: 97 mmol/L (ref 96–106)
Creatinine, Ser: 1.53 mg/dL — ABNORMAL HIGH (ref 0.76–1.27)
GFR, EST AFRICAN AMERICAN: 47 mL/min/{1.73_m2} — AB (ref >59)
GFR, EST NON AFRICAN AMERICAN: 40 mL/min/{1.73_m2} — AB (ref >59)
GLOBULIN, TOTAL: 2.8 g/dL (ref 1.5–4.5)
Glucose: 331 mg/dL — ABNORMAL HIGH (ref 65–99)
POTASSIUM: 5.6 mmol/L — AB (ref 3.5–5.2)
SODIUM: 140 mmol/L (ref 134–144)
TOTAL PROTEIN: 6.6 g/dL (ref 6.0–8.5)

## 2016-06-07 LAB — MICROALBUMIN / CREATININE URINE RATIO
CREATININE, UR: 213.8 mg/dL
MICROALB/CREAT RATIO: 46.1 mg/g{creat} — AB (ref 0.0–30.0)
MICROALBUM., U, RANDOM: 98.5 ug/mL

## 2016-06-07 MED ORDER — ALBIGLUTIDE 30 MG ~~LOC~~ PEN: 30.0000 mg | PEN_INJECTOR | SUBCUTANEOUS | 12 refills | Status: DC

## 2016-06-09 ENCOUNTER — Other Ambulatory Visit: Payer: Self-pay | Admitting: Family Medicine

## 2016-06-10 ENCOUNTER — Ambulatory Visit: Payer: Medicare Other | Admitting: Family Medicine

## 2016-06-25 ENCOUNTER — Other Ambulatory Visit: Payer: Self-pay | Admitting: Family Medicine

## 2016-07-03 ENCOUNTER — Other Ambulatory Visit: Payer: Self-pay | Admitting: Family Medicine

## 2016-07-31 ENCOUNTER — Other Ambulatory Visit: Payer: Self-pay | Admitting: Family Medicine

## 2016-09-05 ENCOUNTER — Ambulatory Visit: Payer: Medicare Other | Admitting: Family Medicine

## 2016-09-07 ENCOUNTER — Other Ambulatory Visit: Payer: Self-pay | Admitting: Family Medicine

## 2016-09-10 IMAGING — CR DG FOOT COMPLETE 3+V*R*
3 series · 3 of 3 positions shown · non-contrast
Comparison: None.

CLINICAL DATA: Heel pain

EXAM:
RIGHT FOOT COMPLETE - 3+ VIEW

[view not recorded (1 of 3)]
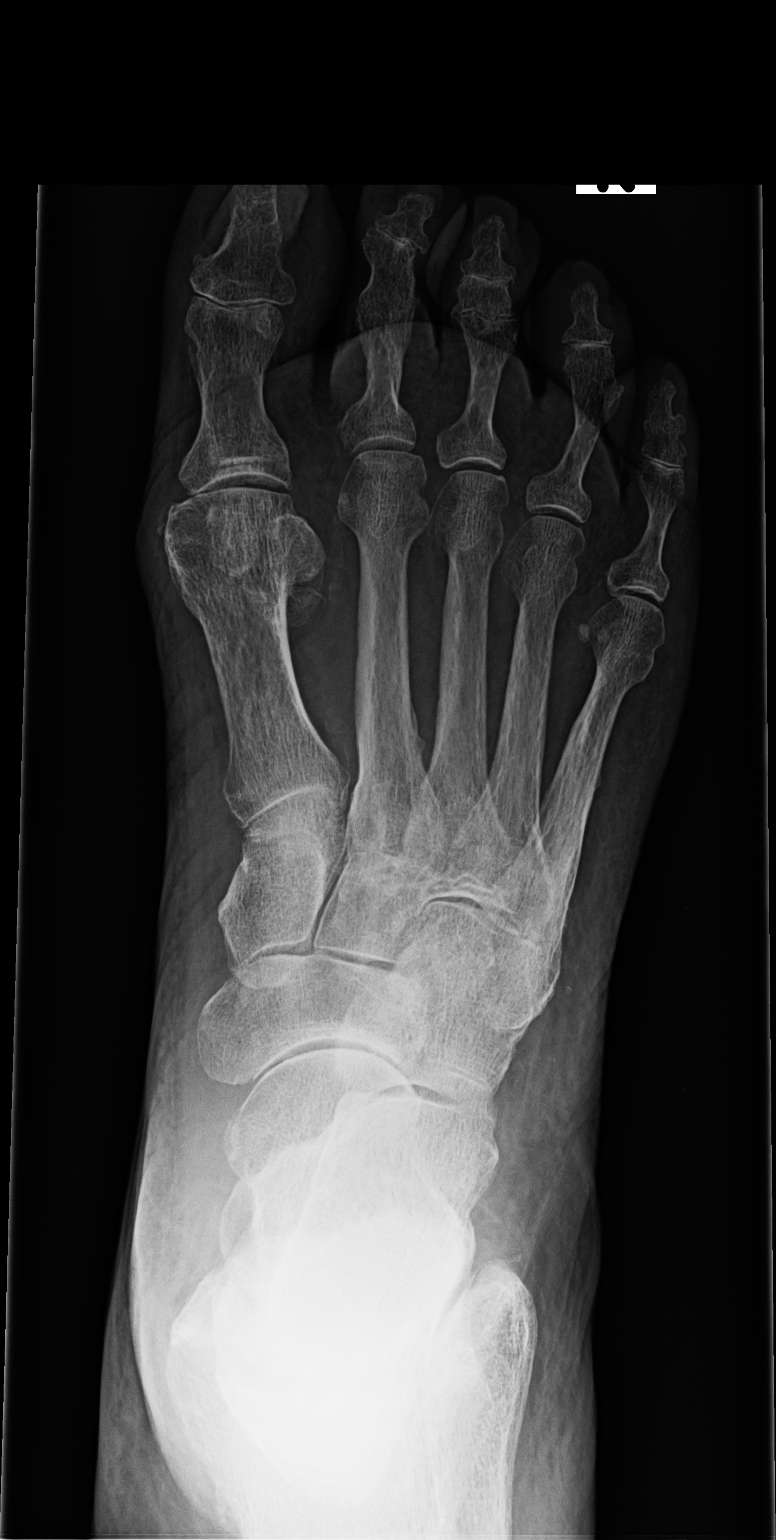

[view not recorded (2 of 3)]
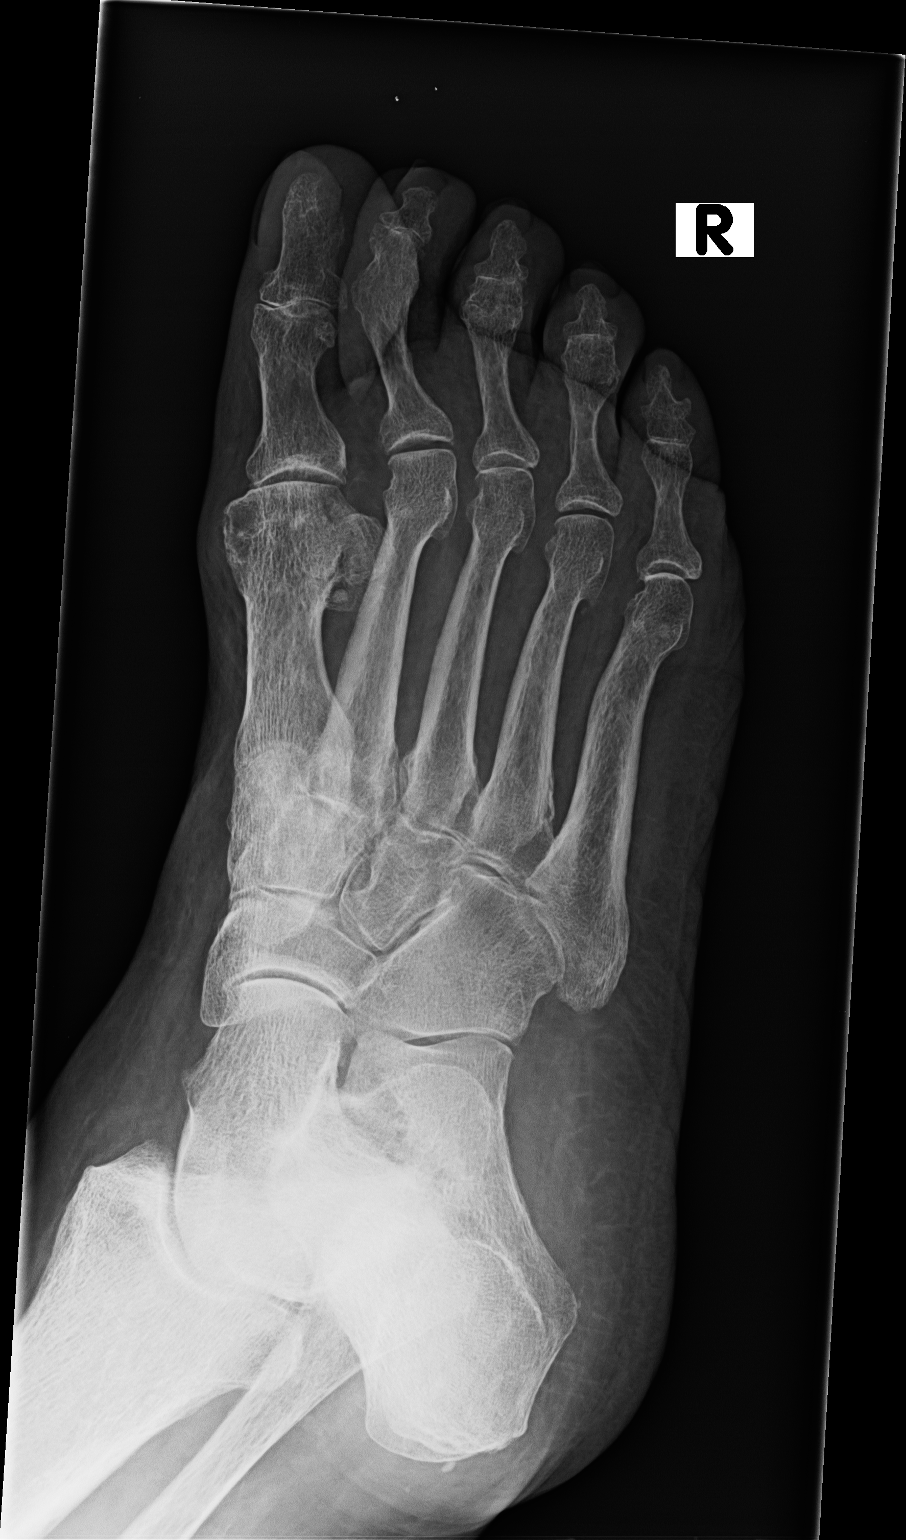

[view not recorded (3 of 3)]
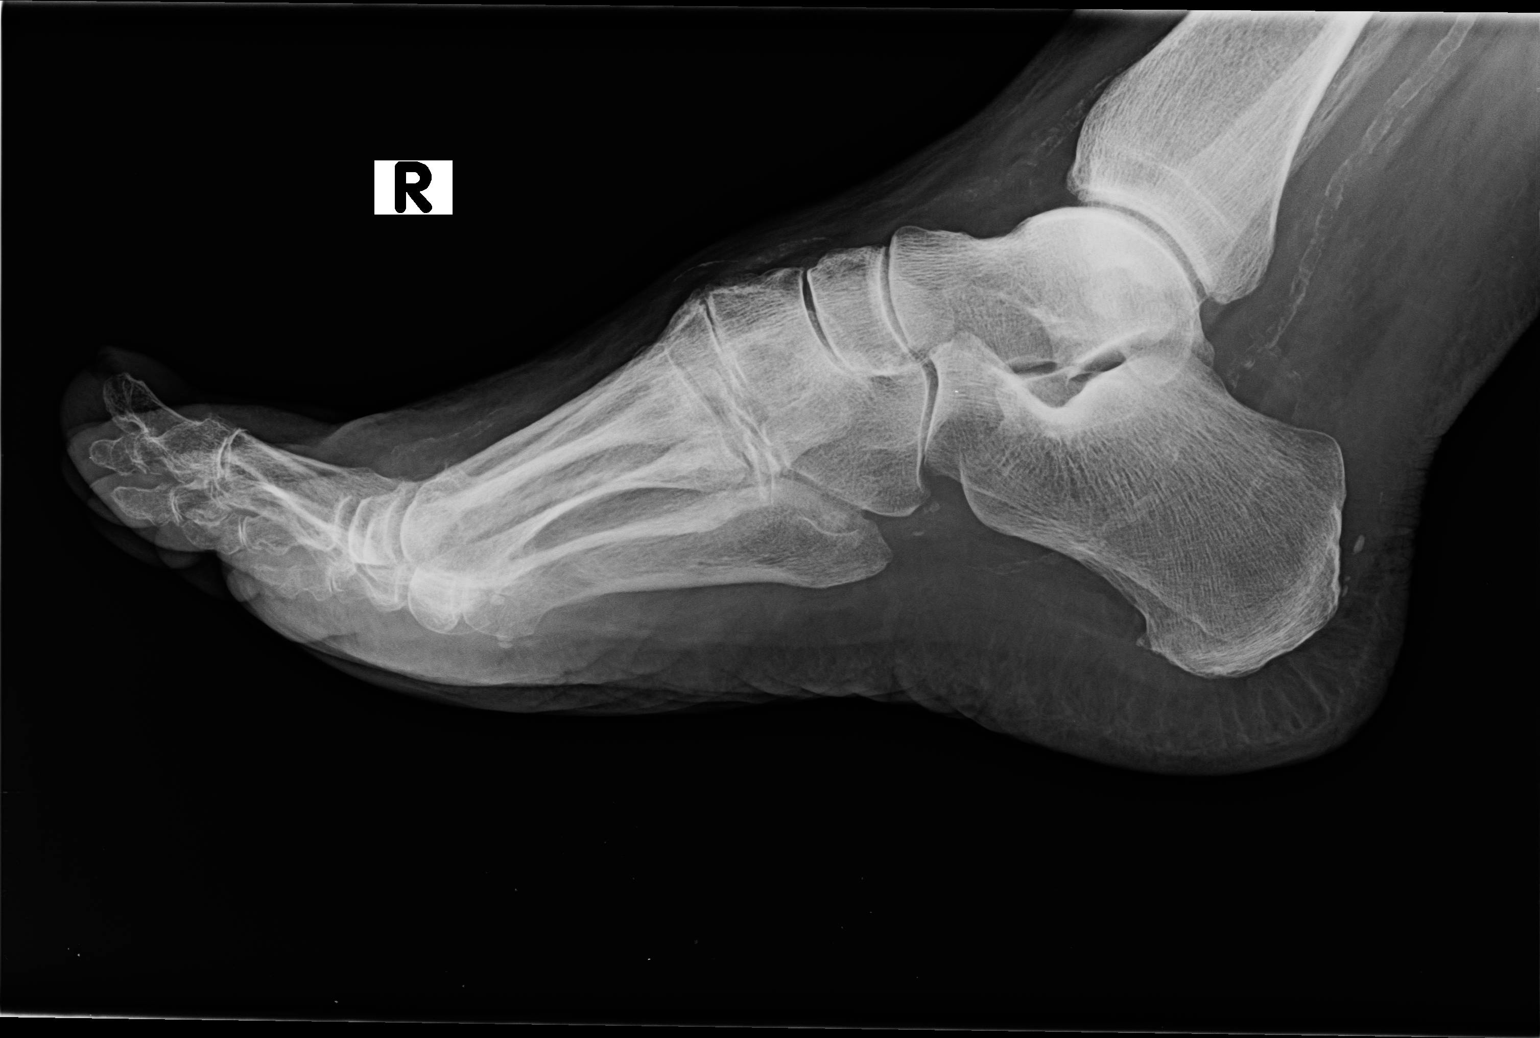

[3 of 3 positions shown; findings below may reference images not displayed]

FINDINGS: The bones appear osteopenic. There is some degenerative change
involving the right first MTP joint. There is fusion of the right
second PIP joint with apparent prior fusion of the right fourth PIP
joint as well. However there is an irregular linear lucency through
that region and a nondisplaced fracture of the right fourth toe is
suspected. A tiny plantar calcaneal degenerative spur is present.
Arterial calcifications are noted.
IMPRESSION: 1. Although there is fusion of the right fourth PIP joint, there
appears to be a linear lucency and possible fracture through that
region.
2. Fusion of the right second PIP joint.
3. Mild degenerative change of the right first MTP joint.

## 2016-09-26 ENCOUNTER — Other Ambulatory Visit: Payer: Self-pay | Admitting: Family Medicine

## 2016-09-26 ENCOUNTER — Encounter: Payer: Self-pay | Admitting: Family Medicine

## 2016-09-26 ENCOUNTER — Ambulatory Visit (INDEPENDENT_AMBULATORY_CARE_PROVIDER_SITE_OTHER): Payer: Medicare Other | Admitting: Family Medicine

## 2016-09-26 VITALS — BP 135/70 | HR 63 | Temp 98.4°F | Ht 68.0 in | Wt 216.0 lb

## 2016-09-26 DIAGNOSIS — E782 Mixed hyperlipidemia: Secondary | ICD-10-CM

## 2016-09-26 DIAGNOSIS — I10 Essential (primary) hypertension: Secondary | ICD-10-CM

## 2016-09-26 DIAGNOSIS — E1122 Type 2 diabetes mellitus with diabetic chronic kidney disease: Secondary | ICD-10-CM | POA: Diagnosis not present

## 2016-09-26 DIAGNOSIS — N400 Enlarged prostate without lower urinary tract symptoms: Secondary | ICD-10-CM

## 2016-09-26 DIAGNOSIS — N183 Chronic kidney disease, stage 3 (moderate): Secondary | ICD-10-CM

## 2016-09-26 LAB — BAYER DCA HB A1C WAIVED: HB A1C: 9.2 % — AB (ref <7.0)

## 2016-09-26 MED ORDER — ATORVASTATIN CALCIUM 20 MG PO TABS: 20.0000 mg | ORAL_TABLET | Freq: Every day | ORAL | 1 refills | Status: DC

## 2016-09-26 MED ORDER — SITAGLIPTIN PHOSPHATE 100 MG PO TABS: 100.0000 mg | ORAL_TABLET | Freq: Every day | ORAL | 1 refills | Status: DC

## 2016-09-26 MED ORDER — AMLODIPINE BESYLATE 5 MG PO TABS: ORAL_TABLET | ORAL | 1 refills | Status: DC

## 2016-09-26 MED ORDER — METOPROLOL SUCCINATE ER 25 MG PO TB24: 25.0000 mg | ORAL_TABLET | Freq: Every day | ORAL | 1 refills | Status: DC

## 2016-09-26 MED ORDER — DULAGLUTIDE 1.5 MG/0.5ML ~~LOC~~ SOAJ: 0.5000 mL | SUBCUTANEOUS | 3 refills | Status: DC

## 2016-09-26 NOTE — Progress Notes (Addendum)
Subjective:  Patient ID: Victor Tapia.,  male    DOB: 1926-03-27  Age: 83 y.o.    CC: Diabetes (pt here today for routine follow up of his chronic medical conditions)   HPI Victor Tapia. presents for  follow-up of hypertension. Patient has no history of headache chest pain or shortness of breath or recent cough. Patient also denies symptoms of TIA such as numbness weakness lateralizing. Patient denies side effects from medication. States taking it regularly.  Patient also  in for follow-up of elevated cholesterol. Doing well without complaints on current medication. Denies side effects of statin including myalgia and arthralgia and nausea. Also in today for liver function testing. Currently no chest pain, shortness of breath or other cardiovascular related symptoms noted.  Follow-up of diabetes. Patient does not check blood sugar at home. Patient denies symptoms such as polyuria, polydipsia, excessive hunger, nausea No significant hypoglycemic spells noted. Medications reviewed. Pt reports taking them regularly. Pt. denies complication/adverse reaction today.    History [name] has a past medical history of CAD (coronary artery disease); Diabetes mellitus; Hyperlipidemia; Hypertension; OA (osteoarthritis); and Prostate cancer ([place]) (1999).   He has a past surgical history that includes TRIPLE BY PASS (1999); CATARACT  SURGERY RIGHT EYE (4/10); and Knee surgery ([place]).   His family history includes Cancer in his brother and sister.He reports that he has never smoked. He has never used smokeless tobacco. He reports that he drinks alcohol. He reports that he does not use drugs.  Current Outpatient Prescriptions on File Prior to Visit  Medication Sig Dispense Refill  . aspirin 325 MG tablet Take 325 mg by mouth daily.      . Multiple Vitamins-Minerals (MENS 50+ MULTI VITAMIN/MIN PO) Take by mouth.     No current facility-administered medications on file prior to visit.      ROS Review of Systems  Constitutional: Negative for chills, diaphoresis, fever and unexpected weight change.  HENT: Negative for congestion, hearing loss, rhinorrhea and sore throat.   Eyes: Negative for visual disturbance.  Respiratory: Negative for cough and shortness of breath.   Cardiovascular: Negative for chest pain.  Gastrointestinal: Negative for abdominal pain, constipation and diarrhea.  Genitourinary: Negative for dysuria and flank pain.  Musculoskeletal: Negative for arthralgias and joint swelling.  Skin: Negative for rash.  Neurological: Negative for dizziness and headaches.  Psychiatric/Behavioral: Negative for dysphoric mood and sleep disturbance.    Objective:  BP 135/70   Pulse 63   Temp 98.4 F (36.9 C) (Oral)   Ht 5' 8"  (1.727 m)   Wt 216 lb (98 kg)   BMI 32.84 kg/m   BP Readings from Last 3 Encounters:  09/26/16 135/70  06/06/16 129/63  03/09/16 134/69    Wt Readings from Last 3 Encounters:  09/26/16 216 lb (98 kg)  06/06/16 220 lb (99.8 kg)  03/09/16 219 lb (99.3 kg)     Physical Exam  Constitutional: He is oriented to person, place, and time. He appears well-developed and well-nourished. No distress.  HENT:  Head: Normocephalic and atraumatic.  Right Ear: External ear normal.  Left Ear: External ear normal.  Nose: Nose normal.  Mouth/Throat: Oropharynx is clear and moist.  Eyes: Pupils are equal, round, and reactive to light. Conjunctivae and EOM are normal.  Neck: Normal range of motion. Neck supple. No thyromegaly present.  Cardiovascular: Normal rate, regular rhythm and normal heart sounds.   No murmur heard. Pulmonary/Chest: Effort normal and breath sounds normal. No respiratory distress.  He has no wheezes. He has no rales.  Abdominal: Soft. Bowel sounds are normal. He exhibits no distension. There is no tenderness.  Lymphadenopathy:    He has no cervical adenopathy.  Neurological: He is alert and oriented to person, place, and  time. He has normal reflexes.  Skin: Skin is warm and dry.  Psychiatric: He has a normal mood and affect. His behavior is normal. Judgment and thought content normal.    Diabetic Foot Exam - Simple   Simple Foot Form Diabetic Foot exam was performed with the following findings:  Yes 09/26/2016 10:45 AM  Visual Inspection No deformities, no ulcerations, no other skin breakdown bilaterally:  Yes Sensation Testing Intact to touch and monofilament testing bilaterally:  Yes Pulse Check Posterior Tibialis and Dorsalis pulse intact bilaterally:  Yes Comments       Assessment & Plan:   [name] was seen today for diabetes.  Diagnoses and all orders for this visit:  Type 2 diabetes mellitus with stage 3 chronic kidney disease, without long-term current use of insulin (HCC) -     CBC with Differential/Platelet -     Bayer DCA Hb A1c Waived -     Ambulatory referral to Ophthalmology  Mixed hyperlipidemia -     CMP14+EGFR  Essential hypertension, benign -     CMP14+EGFR  Benign prostatic hyperplasia, unspecified whether lower urinary tract symptoms present -     PSA, total and free  Other orders -     amLODipine (NORVASC) 5 MG tablet; TAKE 1 TABLET DAILY -     atorvastatin (LIPITOR) 20 MG tablet; Take 1 tablet (20 mg total) by mouth daily. -     Discontinue: sitaGLIPtin (JANUVIA) 100 MG tablet; Take 1 tablet (100 mg total) by mouth daily. -     metoprolol succinate (TOPROL XL) 25 MG 24 hr tablet; Take 1 tablet (25 mg total) by mouth daily.   I have discontinued Mr. Lazcano Albiglutide and JANUVIA. I have changed his TOPROL XL to metoprolol succinate. I have also changed his amLODipine and atorvastatin. Additionally, I am having him maintain his aspirin and Multiple Vitamins-Minerals (MENS 50+ MULTI VITAMIN/MIN PO).  Allergies as of 09/26/2016   No Known Allergies     Medication List       Accurate as of 09/26/16  5:13 PM. Always use your most recent med list.           amLODipine 5 MG tablet Commonly known as:  NORVASC TAKE 1 TABLET DAILY   aspirin 325 MG tablet Take 325 mg by mouth daily.   atorvastatin 20 MG tablet Commonly known as:  LIPITOR Take 1 tablet (20 mg total) by mouth daily.   Dulaglutide 1.5 MG/0.5ML Sopn Commonly known as:  TRULICITY Inject 0.5 mLs into the skin once a week.   MENS 50+ MULTI VITAMIN/MIN PO Take by mouth.   metoprolol succinate 25 MG 24 hr tablet Commonly known as:  TOPROL XL Take 1 tablet (25 mg total) by mouth daily.         Follow-up: Return in about 3 months (around 12/27/2016).  Claretta Fraise, M.D.

## 2016-09-27 ENCOUNTER — Other Ambulatory Visit: Payer: Self-pay | Admitting: Family Medicine

## 2016-09-27 ENCOUNTER — Encounter: Payer: Self-pay | Admitting: Family Medicine

## 2016-09-27 LAB — CBC WITH DIFFERENTIAL/PLATELET
BASOS ABS: 0 10*3/uL (ref 0.0–0.2)
BASOS: 0 %
EOS (ABSOLUTE): 0.2 10*3/uL (ref 0.0–0.4)
Eos: 1 %
Hematocrit: 40.6 % (ref 37.5–51.0)
Hemoglobin: 13.4 g/dL (ref 13.0–17.7)
IMMATURE GRANS (ABS): 0 10*3/uL (ref 0.0–0.1)
Immature Granulocytes: 0 %
LYMPHS ABS: 2.1 10*3/uL (ref 0.7–3.1)
LYMPHS: 19 %
MCH: 31.5 pg (ref 26.6–33.0)
MCHC: 33 g/dL (ref 31.5–35.7)
MCV: 96 fL (ref 79–97)
Monocytes Absolute: 0.7 10*3/uL (ref 0.1–0.9)
Monocytes: 7 %
NEUTROS ABS: 8 10*3/uL — AB (ref 1.4–7.0)
Neutrophils: 73 %
PLATELETS: 216 10*3/uL (ref 150–379)
RBC: 4.25 x10E6/uL (ref 4.14–5.80)
RDW: 13.9 % (ref 12.3–15.4)
WBC: 10.9 10*3/uL — ABNORMAL HIGH (ref 3.4–10.8)

## 2016-09-27 LAB — PSA, TOTAL AND FREE
PSA FREE PCT: 15 %
PSA, Free: 0.03 ng/mL
Prostate Specific Ag, Serum: 0.2 ng/mL (ref 0.0–4.0)

## 2016-09-27 LAB — CMP14+EGFR
A/G RATIO: 1.4 (ref 1.2–2.2)
ALBUMIN: 4.2 g/dL (ref 3.5–4.7)
ALK PHOS: 128 IU/L — AB (ref 39–117)
ALT: 21 IU/L (ref 0–44)
AST: 31 IU/L (ref 0–40)
BILIRUBIN TOTAL: 0.3 mg/dL (ref 0.0–1.2)
BUN / CREAT RATIO: 14 (ref 10–24)
BUN: 19 mg/dL (ref 8–27)
CHLORIDE: 100 mmol/L (ref 96–106)
CO2: 22 mmol/L (ref 20–29)
Calcium: 9.3 mg/dL (ref 8.6–10.2)
Creatinine, Ser: 1.36 mg/dL — ABNORMAL HIGH (ref 0.76–1.27)
GFR calc non Af Amer: 46 mL/min/{1.73_m2} — ABNORMAL LOW (ref >59)
GFR, EST AFRICAN AMERICAN: 53 mL/min/{1.73_m2} — AB (ref >59)
Globulin, Total: 2.9 g/dL (ref 1.5–4.5)
Glucose: 233 mg/dL — ABNORMAL HIGH (ref 65–99)
POTASSIUM: 5 mmol/L (ref 3.5–5.2)
Sodium: 140 mmol/L (ref 134–144)
TOTAL PROTEIN: 7.1 g/dL (ref 6.0–8.5)

## 2016-09-27 MED ORDER — LINAGLIPTIN 5 MG PO TABS: 5.0000 mg | ORAL_TABLET | Freq: Every day | ORAL | 3 refills | Status: DC

## 2016-11-01 DIAGNOSIS — Z7984 Long term (current) use of oral hypoglycemic drugs: Secondary | ICD-10-CM | POA: Diagnosis not present

## 2016-11-01 DIAGNOSIS — E119 Type 2 diabetes mellitus without complications: Secondary | ICD-10-CM | POA: Diagnosis not present

## 2016-11-01 DIAGNOSIS — H43811 Vitreous degeneration, right eye: Secondary | ICD-10-CM | POA: Diagnosis not present

## 2016-11-01 DIAGNOSIS — H2512 Age-related nuclear cataract, left eye: Secondary | ICD-10-CM | POA: Diagnosis not present

## 2016-11-01 LAB — HM DIABETES EYE EXAM

## 2016-11-11 DIAGNOSIS — Z23 Encounter for immunization: Secondary | ICD-10-CM | POA: Diagnosis not present

## 2016-12-01 DIAGNOSIS — H43813 Vitreous degeneration, bilateral: Secondary | ICD-10-CM | POA: Diagnosis not present

## 2016-12-01 DIAGNOSIS — H25812 Combined forms of age-related cataract, left eye: Secondary | ICD-10-CM | POA: Diagnosis not present

## 2016-12-01 DIAGNOSIS — H26491 Other secondary cataract, right eye: Secondary | ICD-10-CM | POA: Diagnosis not present

## 2016-12-01 DIAGNOSIS — E119 Type 2 diabetes mellitus without complications: Secondary | ICD-10-CM | POA: Diagnosis not present

## 2016-12-05 NOTE — Patient Instructions (Signed)
Your procedure is scheduled on: 12/09/2016   Report to [facility] at  745   AM.  Call this number if you have problems the morning of surgery: [phone]   Do not eat food or drink liquids :After Midnight.      Take these medicines the morning of surgery with A SIP OF WATER: norvasc, toprol XL. DO NOT take anything for diabetes the morning of surgery.   Do not wear jewelry, make-up or nail polish.  Do not wear lotions, powders, or perfumes. You may wear deodorant.  Do not shave 48 hours prior to surgery.  Do not bring valuables to the hospital.  Contacts, dentures or bridgework may not be worn into surgery.  Leave suitcase in the car. After surgery it may be brought to your room.  For patients admitted to the hospital, checkout time is 11:00 AM the day of discharge.   Patients discharged the day of surgery will not be allowed to drive home.  :     Please read over the following fact sheets that you were given: Coughing and Deep Breathing, Surgical Site Infection Prevention, Anesthesia Post-op Instructions and Care and Recovery After Surgery    Cataract A cataract is a clouding of the lens of the eye. When a lens becomes cloudy, vision is reduced based on the degree and nature of the clouding. Many cataracts reduce vision to some degree. Some cataracts make people more near-sighted as they develop. Other cataracts increase glare. Cataracts that are ignored and become worse can sometimes look white. The white color can be seen through the pupil. CAUSES   Aging. However, cataracts may occur at any age, even in newborns.   Certain drugs.   Trauma to the eye.   Certain diseases such as diabetes.   Specific eye diseases such as chronic inflammation inside the eye or a sudden attack of a rare form of glaucoma.   Inherited or acquired medical problems.  SYMPTOMS   Gradual, progressive drop in vision in the affected eye.   Severe, rapid visual loss. This most often happens when trauma  is the cause.  DIAGNOSIS  To detect a cataract, an eye doctor examines the lens. Cataracts are best diagnosed with an exam of the eyes with the pupils enlarged (dilated) by drops.  TREATMENT  For an early cataract, vision may improve by using different eyeglasses or stronger lighting. If that does not help your vision, surgery is the only effective treatment. A cataract needs to be surgically removed when vision loss interferes with your everyday activities, such as driving, reading, or watching TV. A cataract may also have to be removed if it prevents examination or treatment of another eye problem. Surgery removes the cloudy lens and usually replaces it with a substitute lens (intraocular lens, IOL).  At a time when both you and your doctor agree, the cataract will be surgically removed. If you have cataracts in both eyes, only one is usually removed at a time. This allows the operated eye to heal and be out of danger from any possible problems after surgery (such as infection or poor wound healing). In rare cases, a cataract may be doing damage to your eye. In these cases, your caregiver may advise surgical removal right away. The vast majority of people who have cataract surgery have better vision afterward. HOME CARE INSTRUCTIONS  If you are not planning surgery, you may be asked to do the following:  Use different eyeglasses.   Use stronger or  brighter lighting.   Ask your eye doctor about reducing your medicine dose or changing medicines if it is thought that a medicine caused your cataract. Changing medicines does not make the cataract go away on its own.   Become familiar with your surroundings. Poor vision can lead to injury. Avoid bumping into things on the affected side. You are at a higher risk for tripping or falling.   Exercise extreme care when driving or operating machinery.   Wear sunglasses if you are sensitive to bright light or experiencing problems with glare.  SEEK  IMMEDIATE MEDICAL CARE IF:   You have a worsening or sudden vision loss.   You notice redness, swelling, or increasing pain in the eye.   You have a fever.  Document Released: 01/24/2005 Document Revised: 01/13/2011 Document Reviewed: 09/17/2010 [facility] Patient Information 2012 [place].PATIENT INSTRUCTIONS POST-ANESTHESIA  IMMEDIATELY FOLLOWING SURGERY:  Do not drive or operate machinery for the first twenty four hours after surgery.  Do not make any important decisions for twenty four hours after surgery or while taking narcotic pain medications or sedatives.  If you develop intractable nausea and vomiting or a severe headache please notify your doctor immediately.  FOLLOW-UP:  Please make an appointment with your surgeon as instructed. You do not need to follow up with anesthesia unless specifically instructed to do so.  WOUND CARE INSTRUCTIONS (if applicable):  Keep a dry clean dressing on the anesthesia/puncture wound site if there is drainage.  Once the wound has quit draining you may leave it open to air.  Generally you should leave the bandage intact for twenty four hours unless there is drainage.  If the epidural site drains for more than 36-48 hours please call the anesthesia department.  QUESTIONS?:  Please feel free to call your physician or the hospital operator if you have any questions, and they will be happy to assist you.

## 2016-12-07 ENCOUNTER — Encounter (HOSPITAL_COMMUNITY): Payer: Self-pay

## 2016-12-07 ENCOUNTER — Encounter (HOSPITAL_COMMUNITY)
Admission: RE | Admit: 2016-12-07 | Discharge: 2016-12-07 | Disposition: A | Discharge status: Home / Self Care | Payer: Medicare Other | Source: Ambulatory Visit | Attending: Ophthalmology | Admitting: Ophthalmology

## 2016-12-07 DIAGNOSIS — H2181 Floppy iris syndrome: Secondary | ICD-10-CM | POA: Diagnosis not present

## 2016-12-07 DIAGNOSIS — I10 Essential (primary) hypertension: Secondary | ICD-10-CM | POA: Diagnosis not present

## 2016-12-07 DIAGNOSIS — Z87891 Personal history of nicotine dependence: Secondary | ICD-10-CM | POA: Diagnosis not present

## 2016-12-07 DIAGNOSIS — M1990 Unspecified osteoarthritis, unspecified site: Secondary | ICD-10-CM | POA: Diagnosis not present

## 2016-12-07 DIAGNOSIS — Z7984 Long term (current) use of oral hypoglycemic drugs: Secondary | ICD-10-CM | POA: Diagnosis not present

## 2016-12-07 DIAGNOSIS — Z79899 Other long term (current) drug therapy: Secondary | ICD-10-CM | POA: Diagnosis not present

## 2016-12-07 DIAGNOSIS — E1136 Type 2 diabetes mellitus with diabetic cataract: Secondary | ICD-10-CM | POA: Diagnosis not present

## 2016-12-07 LAB — CBC WITH DIFFERENTIAL/PLATELET
Basophils Absolute: 0 10*3/uL (ref 0.0–0.1)
Basophils Relative: 0 %
Eosinophils Absolute: 0.1 10*3/uL (ref 0.0–0.7)
Eosinophils Relative: 1 %
HEMATOCRIT: 40.5 % (ref 39.0–52.0)
HEMOGLOBIN: 13.7 g/dL (ref 13.0–17.0)
LYMPHS ABS: 2.4 10*3/uL (ref 0.7–4.0)
LYMPHS PCT: 24 %
MCH: 31.1 pg (ref 26.0–34.0)
MCHC: 33.8 g/dL (ref 30.0–36.0)
MCV: 92 fL (ref 78.0–100.0)
MONOS PCT: 8 %
Monocytes Absolute: 0.8 10*3/uL (ref 0.1–1.0)
NEUTROS ABS: 6.7 10*3/uL (ref 1.7–7.7)
NEUTROS PCT: 67 %
Platelets: 203 10*3/uL (ref 150–400)
RBC: 4.4 MIL/uL (ref 4.22–5.81)
RDW: 12.5 % (ref 11.5–15.5)
WBC: 9.9 10*3/uL (ref 4.0–10.5)

## 2016-12-07 LAB — BASIC METABOLIC PANEL
Anion gap: 11 (ref 5–15)
BUN: 22 mg/dL — ABNORMAL HIGH (ref 6–20)
CHLORIDE: 100 mmol/L — AB (ref 101–111)
CO2: 26 mmol/L (ref 22–32)
CREATININE: 1.46 mg/dL — AB (ref 0.61–1.24)
Calcium: 9.3 mg/dL (ref 8.9–10.3)
GFR calc non Af Amer: 41 mL/min — ABNORMAL LOW (ref >60)
GFR, EST AFRICAN AMERICAN: 48 mL/min — AB (ref >60)
Glucose, Bld: 259 mg/dL — ABNORMAL HIGH (ref 65–99)
Potassium: 3.9 mmol/L (ref 3.5–5.1)
Sodium: 137 mmol/L (ref 135–145)

## 2016-12-09 ENCOUNTER — Encounter (HOSPITAL_COMMUNITY): Payer: Self-pay | Admitting: *Deleted

## 2016-12-09 ENCOUNTER — Ambulatory Visit (HOSPITAL_COMMUNITY): Payer: Medicare Other | Admitting: Anesthesiology

## 2016-12-09 ENCOUNTER — Encounter (HOSPITAL_COMMUNITY)
Admission: RE | Disposition: A | Discharge status: Home / Self Care | Payer: Self-pay | Source: Ambulatory Visit | Attending: Ophthalmology

## 2016-12-09 ENCOUNTER — Ambulatory Visit (HOSPITAL_COMMUNITY)
Admission: RE | Admit: 2016-12-09 | Discharge: 2016-12-09 | Disposition: A | Discharge status: Home / Self Care | Payer: Medicare Other | Source: Ambulatory Visit | Attending: Ophthalmology | Admitting: Ophthalmology

## 2016-12-09 DIAGNOSIS — H2181 Floppy iris syndrome: Secondary | ICD-10-CM | POA: Diagnosis not present

## 2016-12-09 DIAGNOSIS — Z87891 Personal history of nicotine dependence: Secondary | ICD-10-CM | POA: Diagnosis not present

## 2016-12-09 DIAGNOSIS — I10 Essential (primary) hypertension: Secondary | ICD-10-CM | POA: Insufficient documentation

## 2016-12-09 DIAGNOSIS — M1990 Unspecified osteoarthritis, unspecified site: Secondary | ICD-10-CM | POA: Diagnosis not present

## 2016-12-09 DIAGNOSIS — Z79899 Other long term (current) drug therapy: Secondary | ICD-10-CM | POA: Insufficient documentation

## 2016-12-09 DIAGNOSIS — H25812 Combined forms of age-related cataract, left eye: Secondary | ICD-10-CM | POA: Diagnosis not present

## 2016-12-09 DIAGNOSIS — Z7984 Long term (current) use of oral hypoglycemic drugs: Secondary | ICD-10-CM | POA: Diagnosis not present

## 2016-12-09 DIAGNOSIS — E1136 Type 2 diabetes mellitus with diabetic cataract: Secondary | ICD-10-CM | POA: Insufficient documentation

## 2016-12-09 DIAGNOSIS — H2512 Age-related nuclear cataract, left eye: Secondary | ICD-10-CM | POA: Diagnosis not present

## 2016-12-09 HISTORY — PX: CATARACT EXTRACTION W/PHACO: SHX586

## 2016-12-09 LAB — GLUCOSE, CAPILLARY: Glucose-Capillary: 239 mg/dL — ABNORMAL HIGH (ref 65–99)

## 2016-12-09 SURGERY — PHACOEMULSIFICATION, CATARACT, WITH IOL INSERTION
Anesthesia: Monitor Anesthesia Care | Site: Eye | Laterality: Left

## 2016-12-09 MED ORDER — PHENYLEPHRINE HCL 2.5 % OP SOLN
1.0000 [drp] | OPHTHALMIC | Status: AC
  Administered 2016-12-09 (×3): 1 [drp] via OPHTHALMIC

## 2016-12-09 MED ORDER — POVIDONE-IODINE 5 % OP SOLN
OPHTHALMIC | Status: DC | PRN
  Administered 2016-12-09: 1 via OPHTHALMIC

## 2016-12-09 MED ORDER — MIDAZOLAM HCL 2 MG/2ML IJ SOLN
INTRAMUSCULAR | Status: AC
  Filled 2016-12-09: qty 2

## 2016-12-09 MED ORDER — EPINEPHRINE PF 1 MG/ML IJ SOLN
INTRAOCULAR | Status: DC | PRN
  Administered 2016-12-09: 500 mL

## 2016-12-09 MED ORDER — NEOMYCIN-POLYMYXIN-DEXAMETH 3.5-10000-0.1 OP SUSP
OPHTHALMIC | Status: DC | PRN
  Administered 2016-12-09: 2 [drp] via OPHTHALMIC

## 2016-12-09 MED ORDER — FENTANYL CITRATE (PF) 100 MCG/2ML IJ SOLN
25.0000 ug | Freq: Once | INTRAMUSCULAR | Status: AC
  Administered 2016-12-09: 25 ug via INTRAVENOUS

## 2016-12-09 MED ORDER — TETRACAINE HCL 0.5 % OP SOLN
1.0000 [drp] | OPHTHALMIC | Status: AC
  Administered 2016-12-09 (×3): 1 [drp] via OPHTHALMIC

## 2016-12-09 MED ORDER — MIDAZOLAM HCL 2 MG/2ML IJ SOLN
1.0000 mg | INTRAMUSCULAR | Status: AC
  Administered 2016-12-09: 2 mg via INTRAVENOUS

## 2016-12-09 MED ORDER — EPINEPHRINE PF 1 MG/ML IJ SOLN
INTRAMUSCULAR | Status: AC
  Filled 2016-12-09: qty 2

## 2016-12-09 MED ORDER — FENTANYL CITRATE (PF) 100 MCG/2ML IJ SOLN
INTRAMUSCULAR | Status: AC
  Filled 2016-12-09: qty 2

## 2016-12-09 MED ORDER — PROVISC 10 MG/ML IO SOLN
INTRAOCULAR | Status: DC | PRN
  Administered 2016-12-09: 0.85 mL via INTRAOCULAR

## 2016-12-09 MED ORDER — LIDOCAINE HCL (PF) 1 % IJ SOLN
INTRAOCULAR | Status: DC | PRN
  Administered 2016-12-09: 1 mL

## 2016-12-09 MED ORDER — LACTATED RINGERS IV SOLN
INTRAVENOUS | Status: DC
  Administered 2016-12-09: 08:00:00 via INTRAVENOUS

## 2016-12-09 MED ORDER — LIDOCAINE HCL 3.5 % OP GEL
1.0000 "application " | Freq: Once | OPHTHALMIC | Status: AC
  Administered 2016-12-09: 1 via OPHTHALMIC

## 2016-12-09 MED ORDER — SODIUM HYALURONATE 23 MG/ML IO SOLN
INTRAOCULAR | Status: DC | PRN
  Administered 2016-12-09: 0.6 mL via INTRAOCULAR

## 2016-12-09 MED ORDER — CYCLOPENTOLATE-PHENYLEPHRINE 0.2-1 % OP SOLN
1.0000 [drp] | OPHTHALMIC | Status: AC
  Administered 2016-12-09 (×3): 1 [drp] via OPHTHALMIC

## 2016-12-09 MED ORDER — BSS IO SOLN
INTRAOCULAR | Status: DC | PRN
  Administered 2016-12-09: 15 mL

## 2016-12-09 SURGICAL SUPPLY — 14 items
CLOTH BEACON ORANGE TIMEOUT ST (SAFETY) ×3 IMPLANT
EYE SHIELD UNIVERSAL CLEAR (GAUZE/BANDAGES/DRESSINGS) ×3 IMPLANT
GLOVE BIOGEL PI IND STRL 6.5 (GLOVE) ×1 IMPLANT
GLOVE BIOGEL PI IND STRL 7.0 (GLOVE) ×1 IMPLANT
GLOVE BIOGEL PI INDICATOR 6.5 (GLOVE) ×2
GLOVE BIOGEL PI INDICATOR 7.0 (GLOVE) ×2
LENS ALC ACRYL/TECN (Ophthalmic Related) ×3 IMPLANT
PAD ARMBOARD 7.5X6 YLW CONV (MISCELLANEOUS) ×3 IMPLANT
RING MALYGIN (MISCELLANEOUS) ×3 IMPLANT
SYR TB 1ML LL NO SAFETY (SYRINGE) ×3 IMPLANT
TAPE SURG TRANSPORE 1 IN (GAUZE/BANDAGES/DRESSINGS) ×1 IMPLANT
TAPE SURGICAL TRANSPORE 1 IN (GAUZE/BANDAGES/DRESSINGS) ×2
VISCOELASTIC ADDITIONAL (OPHTHALMIC RELATED) ×3 IMPLANT
WATER STERILE IRR 250ML POUR (IV SOLUTION) ×3 IMPLANT

## 2016-12-09 NOTE — Transfer of Care (Signed)
Immediate Anesthesia Transfer of Care Note  Patient: Victor Tapia.  Procedure(s) Performed: CATARACT EXTRACTION PHACO AND INTRAOCULAR LENS PLACEMENT LEFT EYE CDE=12.57 (Left Eye)  Patient Location: Short Stay  Anesthesia Type:MAC  Level of Consciousness: awake, alert , oriented and patient cooperative  Airway & Oxygen Therapy: Patient Spontanous Breathing  Post-op Assessment: Report given to RN and Post -op Vital signs reviewed and stable  Post vital signs: Reviewed and stable  Last Vitals:  Vitals:   12/09/16 0800 12/09/16 0815  BP: 124/68   Resp: 13 (!) 23  SpO2: 98% 99%    Last Pain: There were no vitals filed for this visit.       Complications: No apparent anesthesia complications

## 2016-12-09 NOTE — H&P (Signed)
The H and P was reviewed and updated. The patient was examined.  No changes were found after exam.  The surgical eye was marked.  

## 2016-12-09 NOTE — Op Note (Signed)
Date of procedure: 12/09/16  Pre-operative diagnosis: Visually significant cataract, Left Eye  Post-operative diagnosis: Visually significant cataract, Left Eye; Intra-operative floppy iris syndrome, left eye  Procedure: Complex removal of cataract via phacoemulsification and insertion of intra-ocular lens AMO PCB00  +18.5D into the capsular bag of the Left Eye  Attending surgeon: Gerda Diss. Haylynn Pha, MD, MA  Anesthesia: MAC, Topical Akten  Complications: None  Estimated Blood Loss: <16m (minimal)  Specimens: None  Implants: As above  Indications:  Visually significant cataract, Left Eye  Procedure:  The patient was seen and identified in the pre-operative area. The operative eye was identified and dilated.  The operative eye was marked.  Topical anesthesia was administered to the operative eye.     The patient was then to the operative suite and placed in the supine position.  A timeout was performed confirming the patient, procedure to be performed, and all other relevant information.   The patient's face was prepped and draped in the usual fashion for intra-ocular surgery.  A lid speculum was placed into the operative eye and the surgical microscope moved into place and focused.  A superotemporal paracentesis was created using a 20 gauge paracentesis blade.  Shugarcaine was injected into the anterior chamber.  At this time, it was noted the the iris was flopping and poorly dilated.  Viscoelastic was injected into the anterior chamber.  A temporal clear-corneal main wound incision was created using a 2.456mmicrokeratome.  A Malyugin ring was placed to hold the iris. A continuous curvilinear capsulorrhexis was initiated using an irrigating cystitome and completed using capsulorrhexis forceps.  Hydrodissection and hydrodeliniation were performed.  Viscoelastic was injected into the anterior chamber.  A phacoemulsification handpiece and a chopper as a second instrument were used to remove the  nucleus and epinucleus. The irrigation/aspiration handpiece was used to remove any remaining cortical material.   The capsular bag was reinflated with viscoelastic, checked, and found to be intact.  The intraocular lens was inserted into the capsular bag and dialed into place using a Kuglen hook.  The Malyugin ring was removed.  The irrigation/aspiration handpiece was used to remove any remaining viscoelastic.  The clear corneal wound and paracentesis wounds were then hydrated and checked with Weck-Cels to be watertight.  The lid-speculum and drape was removed, and the patient's face was cleaned with a wet and dry 4x4.  Maxitrol was instilled in the eye before a clear shield was taped over the eye. The patient was taken to the post-operative care unit in good condition, having tolerated the procedure well.  Post-Op Instructions: [facility] and will receive all other orders and instructions.

## 2016-12-09 NOTE — Discharge Instructions (Signed)
Please discharge patient when stable, will follow up today with Dr. Marisa Hua at the [facility] office immediately following discharge.  Leave shield in place until visit.  All paperwork with discharge instructions will be given at the office.   Cataract Surgery, Care After Refer to this sheet in the next few weeks. These instructions provide you with information about caring for yourself after your procedure. Your health care provider may also give you more specific instructions. Your treatment has been planned according to current medical practices, but problems sometimes occur. Call your health care provider if you have any problems or questions after your procedure. What can I expect after the procedure? After the procedure, it is common to have:  Itching.  Discomfort.  Fluid discharge.  Sensitivity to light and to touch.  Bruising.  Follow these instructions at home: [place] your eye every day for signs of infection. Watch for: ? Redness, swelling, or pain. ? Fluid, blood, or pus. ? Warmth. ? Bad smell. Activity  Avoid strenuous activities, such as playing contact sports, for as long as told by your health care provider.  Do not drive or operate heavy machinery until your health care provider approves.  Do not bend or lift heavy objects. Bending increases pressure in the eye. You can walk, climb stairs, and do light household chores.  Ask your health care provider when you can return to work. If you work in a dusty environment, you may be advised to wear protective eyewear for a period of time. General instructions  Take or apply over-the-counter and prescription medicines only as told by your health care provider. This includes eye drops.  Do not touch or rub your eyes.  If you were given a protective shield, wear it as told by your health care provider. If you were not given a protective shield, wear sunglasses as told by your health care provider to  protect your eyes.  Keep the area around your eye clean and dry. Avoid swimming or allowing water to hit you directly in the face while showering until told by your health care provider. Keep soap and shampoo out of your eyes.  Do not put a contact lens into the affected eye or eyes until your health care provider approves.  Keep all follow-up visits as told by your health care provider. This is important. Contact a health care provider if:   You have increased bruising around your eye.  You have pain that is not helped with medicine.  You have a fever.  You have redness, swelling, or pain in your eye.  You have fluid, blood, or pus coming from your incision.  Your vision gets worse. Get help right away if:  You have sudden vision loss. This information is not intended to replace advice given to you by your health care provider. Make sure you discuss any questions you have with your health care provider. Document Released: 08/13/2004 Document Revised: 06/04/2015 Document Reviewed: 12/04/2014 Elsevier Interactive Patient Education  2017 [employer]  PATIENT INSTRUCTIONS POST-ANESTHESIA  IMMEDIATELY FOLLOWING SURGERY:  Do not drive or operate machinery for the first twenty four hours after surgery.  Do not make any important decisions for twenty four hours after surgery or while taking narcotic pain medications or sedatives.  If you develop intractable nausea and vomiting or a severe headache please notify your doctor immediately.  FOLLOW-UP:  Please make an appointment with your surgeon as instructed. You do not need to follow up with  anesthesia unless specifically instructed to do so.  WOUND CARE INSTRUCTIONS (if applicable):  Keep a dry clean dressing on the anesthesia/puncture wound site if there is drainage.  Once the wound has quit draining you may leave it open to air.  Generally you should leave the bandage intact for twenty four hours unless there is drainage.  If the  epidural site drains for more than 36-48 hours please call the anesthesia department.  QUESTIONS?:  Please feel free to call your physician or the hospital operator if you have any questions, and they will be happy to assist you.

## 2016-12-09 NOTE — Anesthesia Preprocedure Evaluation (Signed)
Anesthesia Evaluation  Patient identified by MRN, date of birth, ID band Patient awake    Reviewed: Allergy & Precautions, NPO status , Patient's Chart, lab work & pertinent test results  Airway Mallampati: II  TM Distance: >3 FB Neck ROM: Full    Dental  (+) Teeth Intact, Partial Lower   Pulmonary former smoker,    breath sounds clear to auscultation       Cardiovascular hypertension, Pt. on medications (-) angina+ CAD and + CABG   Rhythm:Regular Rate:Normal     Neuro/Psych    GI/Hepatic   Endo/Other  diabetes, Type 2, Oral Hypoglycemic Agents  Renal/GU      Musculoskeletal  (+) Arthritis ,   Abdominal   Peds  Hematology   Anesthesia Other Findings   Reproductive/Obstetrics                             Anesthesia Physical Anesthesia Plan  ASA: III  Anesthesia Plan: MAC   Post-op Pain Management:    Induction: Intravenous  PONV Risk Score and Plan:   Airway Management Planned: Nasal Cannula  Additional Equipment:   Intra-op Plan:   Post-operative Plan:   Informed Consent: I have reviewed the patients History and Physical, chart, labs and discussed the procedure including the risks, benefits and alternatives for the proposed anesthesia with the patient or authorized representative who has indicated his/her understanding and acceptance.     Plan Discussed with:   Anesthesia Plan Comments:         Anesthesia Quick Evaluation

## 2016-12-09 NOTE — Anesthesia Postprocedure Evaluation (Signed)
Anesthesia Post Note  Patient: Victor Tapia.  Procedure(s) Performed: CATARACT EXTRACTION PHACO AND INTRAOCULAR LENS PLACEMENT LEFT EYE CDE=12.57 (Left Eye)  Patient location during evaluation: Short Stay Anesthesia Type: MAC Level of consciousness: awake and alert, oriented and patient cooperative Pain management: pain level controlled Vital Signs Assessment: post-procedure vital signs reviewed and stable Respiratory status: spontaneous breathing Cardiovascular status: stable Postop Assessment: no apparent nausea or vomiting Anesthetic complications: no     Last Vitals:  Vitals:   12/09/16 0800 12/09/16 0815  BP: 124/68   Resp: 13 (!) 23  SpO2: 98% 99%    Last Pain: There were no vitals filed for this visit.               ADAMS, AMY A

## 2016-12-09 NOTE — Anesthesia Procedure Notes (Signed)
Procedure Name: MAC Date/Time: 12/09/2016 8:40 AM Performed by: Andree Elk, Abbiegail Landgren A Pre-anesthesia Checklist: Patient identified, Timeout performed, Emergency Drugs available, Suction available and Patient being monitored Oxygen Delivery Method: Nasal cannula

## 2016-12-12 ENCOUNTER — Encounter (HOSPITAL_COMMUNITY): Payer: Self-pay | Admitting: Ophthalmology

## 2016-12-27 ENCOUNTER — Ambulatory Visit: Payer: Medicare Other | Admitting: Family Medicine

## 2017-02-15 ENCOUNTER — Encounter: Payer: Self-pay | Admitting: Family Medicine

## 2017-02-15 ENCOUNTER — Ambulatory Visit (INDEPENDENT_AMBULATORY_CARE_PROVIDER_SITE_OTHER): Payer: Medicare Other | Admitting: Family Medicine

## 2017-02-15 VITALS — BP 124/66 | HR 75 | Temp 97.9°F | Ht 70.0 in | Wt 212.0 lb

## 2017-02-15 DIAGNOSIS — E1122 Type 2 diabetes mellitus with diabetic chronic kidney disease: Secondary | ICD-10-CM | POA: Diagnosis not present

## 2017-02-15 DIAGNOSIS — N183 Chronic kidney disease, stage 3 unspecified: Secondary | ICD-10-CM

## 2017-02-15 DIAGNOSIS — E782 Mixed hyperlipidemia: Secondary | ICD-10-CM

## 2017-02-15 DIAGNOSIS — I10 Essential (primary) hypertension: Secondary | ICD-10-CM

## 2017-02-15 LAB — BAYER DCA HB A1C WAIVED: HB A1C (BAYER DCA - WAIVED): 10.9 % — ABNORMAL HIGH (ref <7.0)

## 2017-02-15 MED ORDER — ATORVASTATIN CALCIUM 20 MG PO TABS: 20.0000 mg | ORAL_TABLET | Freq: Every day | ORAL | 1 refills | Status: DC

## 2017-02-15 MED ORDER — METOPROLOL SUCCINATE ER 25 MG PO TB24: 25.0000 mg | ORAL_TABLET | Freq: Every day | ORAL | 1 refills | Status: DC

## 2017-02-15 MED ORDER — AMLODIPINE BESYLATE 5 MG PO TABS: ORAL_TABLET | ORAL | 1 refills | Status: DC

## 2017-02-15 NOTE — Progress Notes (Signed)
Subjective:  Patient ID: Victor Tapia.,  male    DOB: 09-Sep-1926  Age: 84 y.o.    CC: Diabetes (pt here today for routine follow up of his chronic medical conditions, and also c/o having to get up at least twice at night to go to the bathroom.)   HPI Christophere Tapia. presents for  follow-up of hypertension. Patient has no history of headache chest pain or shortness of breath or recent cough. Patient also denies symptoms of TIA such as numbness weakness lateralizing. Patient denies side effects from medication. States taking it regularly.  Patient also  in for follow-up of elevated cholesterol. Doing well without complaints on current medication. Denies side effects of statin including myalgia and arthralgia and nausea. Also in today for liver function testing. Currently no chest pain, shortness of breath or other cardiovascular related symptoms noted.  Follow-up of diabetes.  Patient does not monitor his blood sugar and is not willing to do so.  He tries to watch his diet and take his medicine.  He feels that at age 45 that should be sufficient Patient denies symptoms such as polyuria, polydipsia, excessive hunger, nausea No significant hypoglycemic spells noted. Medications reviewed. Pt reports taking them regularly. Pt. denies complication/adverse reaction today.    History [name] has a past medical history of CAD (coronary artery disease), Diabetes mellitus, Hyperlipidemia, Hypertension, OA (osteoarthritis), and Prostate cancer ([place]) (1999).   He has a past surgical history that includes TRIPLE BY PASS (1999); CATARACT  SURGERY RIGHT EYE (4/10); Knee surgery (Right); and Cataract extraction w/PHACO (Left, 12/09/2016).   His family history includes Cancer in his brother and sister.He reports that he quit smoking about 54 years ago. His smoking use included cigarettes. He has a 10.00 pack-year smoking history. he has never used smokeless tobacco. He reports that he drinks about 1.8 oz of  alcohol per week. He reports that he does not use drugs.  Current Outpatient Medications on File Prior to Visit  Medication Sig Dispense Refill  . aspirin 325 MG tablet Take 325 mg by mouth daily.      Marland Kitchen linagliptin (TRADJENTA) 5 MG TABS tablet Take 1 tablet (5 mg total) by mouth daily. For diabetes 90 tablet 3  . Multiple Vitamins-Minerals (MENS 50+ MULTI VITAMIN/MIN PO) Take 1 tablet by mouth daily.      No current facility-administered medications on file prior to visit.     ROS Review of Systems  Constitutional: Negative for chills, diaphoresis, fever and unexpected weight change.  HENT: Negative for congestion, hearing loss, rhinorrhea and sore throat.   Eyes: Negative for visual disturbance.  Respiratory: Negative for cough and shortness of breath.   Cardiovascular: Negative for chest pain.  Gastrointestinal: Negative for abdominal pain, constipation and diarrhea.  Genitourinary: Negative for dysuria and flank pain.  Musculoskeletal: Negative for arthralgias and joint swelling.  Skin: Negative for rash.  Neurological: Negative for dizziness and headaches.  Psychiatric/Behavioral: Negative for dysphoric mood and sleep disturbance.    Objective:  BP 124/66   Pulse 75   Temp 97.9 F (36.6 C) (Oral)   Ht 5' 10" (1.778 m)   Wt 212 lb (96.2 kg)   BMI 30.42 kg/m   BP Readings from Last 3 Encounters:  02/15/17 124/66  12/09/16 (!) 145/59  12/07/16 (!) 158/65    Wt Readings from Last 3 Encounters:  02/15/17 212 lb (96.2 kg)  12/07/16 213 lb (96.6 kg)  09/26/16 216 lb (98 kg)  Physical Exam  Constitutional: He is oriented to person, place, and time. He appears well-developed and well-nourished. No distress.  HENT:  Head: Normocephalic and atraumatic.  Right Ear: External ear normal.  Left Ear: External ear normal.  Nose: Nose normal.  Mouth/Throat: Oropharynx is clear and moist.  Eyes: Conjunctivae and EOM are normal. Pupils are equal, round, and reactive to  light.  Neck: Normal range of motion. Neck supple. No thyromegaly present.  Cardiovascular: Normal rate, regular rhythm and normal heart sounds.  No murmur heard. Pulmonary/Chest: Effort normal and breath sounds normal. No respiratory distress. He has no wheezes. He has no rales.  Abdominal: Soft. Bowel sounds are normal. He exhibits no distension. There is no tenderness.  Lymphadenopathy:    He has no cervical adenopathy.  Neurological: He is alert and oriented to person, place, and time. He has normal reflexes.  Skin: Skin is warm and dry.  Psychiatric: He has a normal mood and affect. His behavior is normal. Judgment and thought content normal.    Diabetic Foot Exam - Simple   Simple Foot Form Diabetic Foot exam was performed with the following findings:  Yes 02/15/2017  3:45 PM  Visual Inspection No deformities, no ulcerations, no other skin breakdown bilaterally:  Yes Sensation Testing Intact to touch and monofilament testing bilaterally:  Yes Pulse Check Posterior Tibialis and Dorsalis pulse intact bilaterally:  Yes Comments       Assessment & Plan:   [name] was seen today for diabetes.  Diagnoses and all orders for this visit:  Type 2 diabetes mellitus with stage 3 chronic kidney disease, without long-term current use of insulin (HCC) -     Bayer DCA Hb A1c Waived  Mixed hyperlipidemia -     Lipid panel  Essential hypertension, benign -     CBC with Differential/Platelet -     CMP14+EGFR  Other orders -     amLODipine (NORVASC) 5 MG tablet; TAKE 1 TABLET DAILY -     atorvastatin (LIPITOR) 20 MG tablet; Take 1 tablet (20 mg total) by mouth daily at 6 PM. -     metoprolol succinate (TOPROL XL) 25 MG 24 hr tablet; Take 1 tablet (25 mg total) by mouth daily.   I have discontinued [name]'s JANUVIA. I have also changed his atorvastatin. Additionally, I am having him maintain his aspirin, Multiple Vitamins-Minerals (MENS 50+ MULTI VITAMIN/MIN PO), linagliptin,  amLODipine, and metoprolol succinate.  Meds ordered this encounter  Medications  . amLODipine (NORVASC) 5 MG tablet    Sig: TAKE 1 TABLET DAILY    Dispense:  90 tablet    Refill:  1  . atorvastatin (LIPITOR) 20 MG tablet    Sig: Take 1 tablet (20 mg total) by mouth daily at 6 PM.    Dispense:  90 tablet    Refill:  1  . metoprolol succinate (TOPROL XL) 25 MG 24 hr tablet    Sig: Take 1 tablet (25 mg total) by mouth daily.    Dispense:  90 tablet    Refill:  1   Patient is willing to accept the consequences of poor control.  He is willing to increase his medicine if his A1c is too high.  Results are pending at this time Follow-up: Return in about 3 months (around 05/16/2017).  Claretta Fraise, M.D.

## 2017-02-16 ENCOUNTER — Telehealth: Payer: Self-pay | Admitting: Family Medicine

## 2017-02-16 LAB — CMP14+EGFR
A/G RATIO: 1.1 — AB (ref 1.2–2.2)
ALT: 24 IU/L (ref 0–44)
AST: 27 IU/L (ref 0–40)
Albumin: 4 g/dL (ref 3.5–4.7)
Alkaline Phosphatase: 118 IU/L — ABNORMAL HIGH (ref 39–117)
BILIRUBIN TOTAL: 0.3 mg/dL (ref 0.0–1.2)
BUN/Creatinine Ratio: 20 (ref 10–24)
BUN: 29 mg/dL — ABNORMAL HIGH (ref 8–27)
CHLORIDE: 97 mmol/L (ref 96–106)
CO2: 26 mmol/L (ref 20–29)
Calcium: 9.9 mg/dL (ref 8.6–10.2)
Creatinine, Ser: 1.47 mg/dL — ABNORMAL HIGH (ref 0.76–1.27)
GFR calc non Af Amer: 42 mL/min/{1.73_m2} — ABNORMAL LOW (ref >59)
GFR, EST AFRICAN AMERICAN: 49 mL/min/{1.73_m2} — AB (ref >59)
Globulin, Total: 3.6 g/dL (ref 1.5–4.5)
Glucose: 406 mg/dL (ref 65–99)
POTASSIUM: 5.3 mmol/L — AB (ref 3.5–5.2)
Sodium: 141 mmol/L (ref 134–144)
TOTAL PROTEIN: 7.6 g/dL (ref 6.0–8.5)

## 2017-02-16 LAB — LIPID PANEL
Chol/HDL Ratio: 3.1 ratio (ref 0.0–5.0)
Cholesterol, Total: 141 mg/dL (ref 100–199)
HDL: 46 mg/dL (ref >39)
LDL Calculated: 58 mg/dL (ref 0–99)
Triglycerides: 186 mg/dL — ABNORMAL HIGH (ref 0–149)
VLDL Cholesterol Cal: 37 mg/dL (ref 5–40)

## 2017-02-16 LAB — CBC WITH DIFFERENTIAL/PLATELET
BASOS: 0 %
Basophils Absolute: 0 10*3/uL (ref 0.0–0.2)
EOS (ABSOLUTE): 0.1 10*3/uL (ref 0.0–0.4)
Eos: 1 %
Hematocrit: 43.3 % (ref 37.5–51.0)
Hemoglobin: 14 g/dL (ref 13.0–17.7)
Immature Grans (Abs): 0 10*3/uL (ref 0.0–0.1)
Immature Granulocytes: 0 %
LYMPHS ABS: 2.1 10*3/uL (ref 0.7–3.1)
Lymphs: 21 %
MCH: 31.1 pg (ref 26.6–33.0)
MCHC: 32.3 g/dL (ref 31.5–35.7)
MCV: 96 fL (ref 79–97)
Monocytes Absolute: 0.8 10*3/uL (ref 0.1–0.9)
Monocytes: 7 %
NEUTROS ABS: 7.2 10*3/uL — AB (ref 1.4–7.0)
Neutrophils: 71 %
PLATELETS: 247 10*3/uL (ref 150–379)
RBC: 4.5 x10E6/uL (ref 4.14–5.80)
RDW: 13.1 % (ref 12.3–15.4)
WBC: 10.2 10*3/uL (ref 3.4–10.8)

## 2017-02-16 NOTE — Telephone Encounter (Signed)
I discussed critical blood glucose result of 406 from his labs on 02/16/2016.  Per the note from yesterday, it appears that patient does not monitor his blood glucose regularly nor does he wish to.  I did review this with the patient and he did indeed note that he does not monitor his blood sugar.  He denies dizziness, shortness of breath, chest pain, nausea, vomiting, abdominal pain, polydipsia, polyuria or feeling ill.  He reports compliance with the Tradjenta.    Patient goes on to ask about BPH medications, which she had discussed with his PCP yesterday.  I do not see where this has been called in.  I will defer to PCP for changes in medication regimen, addition of BPH medication.  Given GFR, would likely avoid metformin in this patient.  Could consider adding glipizide.  Given A1c of 10, unlikely that he would have hypoglycemic episodes with combination of these medications.  Patient does not seem to be a good candidate for insulin given resistance to blood sugar monitoring.  Ashly M. Lajuana Ripple, [facility]

## 2017-02-17 ENCOUNTER — Other Ambulatory Visit: Payer: Self-pay | Admitting: Family Medicine

## 2017-02-17 MED ORDER — GLIMEPIRIDE 1 MG PO TABS: 1.0000 mg | ORAL_TABLET | Freq: Every day | ORAL | 2 refills | Status: DC

## 2017-02-17 MED ORDER — TAMSULOSIN HCL 0.4 MG PO CAPS: 0.8000 mg | ORAL_CAPSULE | Freq: Every day | ORAL | 3 refills | Status: DC

## 2017-02-17 NOTE — Progress Notes (Signed)
Scrip sent 

## 2017-03-16 ENCOUNTER — Telehealth: Payer: Self-pay

## 2017-03-16 NOTE — Telephone Encounter (Signed)
Victor Tapia was approved for prior authorization

## 2017-03-21 NOTE — Telephone Encounter (Signed)
Thanks! Will take care of it now. WS 

## 2017-04-20 ENCOUNTER — Encounter: Payer: Self-pay | Admitting: *Deleted

## 2017-04-20 ENCOUNTER — Ambulatory Visit (INDEPENDENT_AMBULATORY_CARE_PROVIDER_SITE_OTHER): Payer: Medicare Other | Admitting: *Deleted

## 2017-04-20 VITALS — BP 118/64 | HR 80 | Ht 65.5 in | Wt 205.0 lb

## 2017-04-20 DIAGNOSIS — Z0000 Encounter for general adult medical examination without abnormal findings: Secondary | ICD-10-CM

## 2017-04-20 NOTE — Progress Notes (Addendum)
Subjective:   Victor Tapia. is a 84 y.o. male who presents for an Initial Medicare Annual Wellness Visit. 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 He lives with his wife and they have 3 adult children an "lots" of grandchildren   Review of Systems  Health is about the same as last year.  Cardiac Risk Factors include: advanced age (>71men, >57 women);sedentary lifestyle;obesity (BMI >30kg/m2);diabetes mellitus;dyslipidemia;hypertension;male gender    Objective:    Today's Vitals   04/20/17 1402  BP: 118/64  Pulse: 80  Weight: 205 lb (93 kg)  Height: 5' 5.5" (1.664 m)   Body mass index is 33.59 kg/m.  Advanced Directives 04/20/2017 12/09/2016 12/07/2016  Does Patient Have a Medical Advance Directive? Yes No No  Type of Advance Directive Living will;Healthcare Power of Attorney - -  Does patient want to make changes to medical advance directive? No - Patient declined - -  Copy of [place] in Chart? No - copy requested - -  Would patient like information on creating a medical advance directive? - No - Patient declined No - Patient declined    Current Medications (verified) Outpatient Encounter Medications as of 04/20/2017  Medication Sig  . amLODipine (NORVASC) 5 MG tablet TAKE 1 TABLET DAILY  . aspirin 325 MG tablet Take 325 mg by mouth daily.    Marland Kitchen atorvastatin (LIPITOR) 20 MG tablet Take 1 tablet (20 mg total) by mouth daily at 6 PM.  . glimepiride (AMARYL) 1 MG tablet Take 1 tablet (1 mg total) by mouth daily with breakfast.  . linagliptin (TRADJENTA) 5 MG TABS tablet Take 1 tablet (5 mg total) by mouth daily. For diabetes  . metoprolol succinate (TOPROL XL) 25 MG 24 hr tablet Take 1 tablet (25 mg total) by mouth daily.  . Multiple Vitamins-Minerals (MENS 50+ MULTI VITAMIN/MIN PO) Take 1 tablet by mouth daily.   . tamsulosin (FLOMAX) 0.4 MG CAPS capsule Take 2 capsules (0.8  mg total) by mouth at bedtime. For urine flow and prostate   No facility-administered encounter medications on file as of 04/20/2017.     Allergies (verified) Patient has no known allergies.   History: Past Medical History:  Diagnosis Date  . CAD (coronary artery disease)   . Diabetes mellitus   . Hyperlipidemia   . Hypertension   . OA (osteoarthritis)    MULTIPLE SITES   . Prostate cancer (Victor Tapia) 1999   HORMONE TREATMENT    Past Surgical History:  Procedure Laterality Date  . CATARACT  SURGERY RIGHT EYE  4/10  . CATARACT EXTRACTION W/PHACO Left 12/09/2016   Procedure: CATARACT EXTRACTION PHACO AND INTRAOCULAR LENS PLACEMENT LEFT EYE CDE=12.57;  Surgeon: Baruch Goldmann, MD;  Location: [facility];  Service: Ophthalmology;  Laterality: Left;  left  . KNEE SURGERY Right    1965  . TRIPLE BY PASS  1999   Family History  Problem Relation Age of Onset  . Cancer Sister        lymphoma  . Cancer Brother        lunch   Social History   Socioeconomic History  . Marital status: Married    Spouse name: Not on file  . Number of children: 3  . Years of education: 16  . Highest education level: Bachelor's degree (e.g., BA, AB, BS)  Social Needs  . Financial resource strain: Not hard  at all  . Food insecurity - worry: Never true  . Food insecurity - inability: Never true  . Transportation needs - medical: No  . Transportation needs - non-medical: No  Occupational History  . Occupation: Customer service manager    Comment: Retired  . Occupation: Social research officer, government    Comment: Retired  Tobacco Use  . Smoking status: Former Smoker    Packs/day: 1.00    Years: 10.00    Pack years: 10.00    Types: Cigarettes    Last attempt to quit: 12/08/1962    Years since quitting: 54.4  . Smokeless tobacco: Never Used  Substance and Sexual Activity  . Alcohol use: Yes    Alcohol/week: 1.8 oz    Types: 3 Cans of beer per week  . Drug use: No  . Sexual activity: Yes    Birth control/protection: None  Other Topics  Concern  . Not on file  Social History Narrative  . Not on file    Clinical Intake:     Pain : No/denies pain     Nutritional Status: BMI > 30  Obese Diabetes: Yes CBG done?: No Did pt. bring in CBG monitor from home?: No  How often do you need to have someone help you when you read instructions, pamphlets, or other written materials from your doctor or pharmacy?: 1 - Never What is the last grade level you completed in school?: bachelors degree  Interpreter Needed?: No  Information entered by :: Chong Sicilian, RN  Activities of Daily Living In your present state of health, do you have any difficulty performing the following activities: 04/20/2017 12/07/2016  Hearing? N N  Vision? N Y  Comment recent cataract removal and eye exam -  Difficulty concentrating or making decisions? N N  Walking or climbing stairs? N Y  Comment - right knee  Dressing or bathing? N N  Doing errands, shopping? N N  Preparing Food and eating ? N -  Using the Toilet? N -  In the past six months, have you accidently leaked urine? N -  Do you have problems with loss of bowel control? N -  Managing your Medications? N -  Managing your Finances? N -  Housekeeping or managing your Housekeeping? N -  Some recent data might be hidden     Immunizations and Health Maintenance Immunization History  Administered Date(s) Administered  . Influenza, High Dose Seasonal PF 11/24/2014, 11/11/2016  . Influenza-Unspecified 12/08/2012, 11/07/2013  . Pneumococcal Polysaccharide-23 11/12/2015   Health Maintenance Due  Topic Date Due  . TETANUS/TDAP  08/15/1947  . PNA vac Low Risk Adult (2 of 2 - PCV13) 11/11/2016    Patient Care Team: Claretta Fraise, MD as PCP - General (Family Medicine)  No hospitalizations, ER visits, or surgeries this past year.      Assessment:   This is a routine wellness examination for Victor Tapia.  Hearing/Vision screen No exam data present  Dietary issues and exercise  activities discussed: Current Exercise Habits: Home exercise routine, Type of exercise: treadmill, Time (Minutes): 30, Frequency (Times/Week): 1, Weekly Exercise (Minutes/Week): 30, Intensity: Mild, Exercise limited by: None identified  Goals    . Exercise 150 min/wk Moderate Activity      Depression Screen PHQ 2/9 Scores 04/20/2017 02/15/2017 09/26/2016 06/06/2016  PHQ - 2 Score 0 0 0 0    Fall Risk Fall Risk  04/20/2017 02/15/2017 06/06/2016 03/09/2016 09/09/2015  Falls in the past year? No No No No No  Number falls in past yr: - - - - -  Injury with Fall? - - - - -    Cognitive Function: MMSE - Mini Mental State Exam 04/20/2017  Orientation to time 5  Orientation to Place 5  Registration 3  Attention/ Calculation 4  Recall 3  Language- name 2 objects 2  Language- repeat 1  Language- follow 3 step command 3  Language- read & follow direction 1  Write a sentence 1  Copy design 1  Total score 29        Screening Tests Health Maintenance  Topic Date Due  . TETANUS/TDAP  08/15/1947  . PNA vac Low Risk Adult (2 of 2 - PCV13) 11/11/2016  . URINE MICROALBUMIN  06/06/2017  . HEMOGLOBIN A1C  08/15/2017  . OPHTHALMOLOGY EXAM  11/01/2017  . FOOT EXAM  02/15/2018  . INFLUENZA VACCINE  Completed      Plan:   keep f/u with PCP Continue to stay active. Aim for 150 minutes of moderate activity a week.    I have personally reviewed and noted the following in the patient's chart:   . Medical and social history . Use of alcohol, tobacco or illicit drugs  . Current medications and supplements . Functional ability and status . Nutritional status . Physical activity . Advanced directives . List of other physicians . Hospitalizations, surgeries, and ER visits in previous 12 months . Vitals . Screenings to include cognitive, depression, and falls . Referrals and appointments  In addition, I have reviewed and discussed with patient certain preventive protocols, quality metrics, and  best practice recommendations. A written personalized care plan for preventive services as well as general preventive health recommendations were provided to patient.     Chong Sicilian, RN   04/21/2017     I have reviewed and agree with the above AWV documentation.   Assunta Found, MD [place]

## 2017-04-20 NOTE — Patient Instructions (Signed)
  Mr. Vanatta , Thank you for taking time to come for your Medicare Wellness Visit. I appreciate your ongoing commitment to your health goals. Please review the following plan we discussed and let me know if I can assist you in the future.   These are the goals we discussed: Goals    . Exercise 150 min/wk Moderate Activity       This is a list of the screening recommended for you and due dates:  Health Maintenance  Topic Date Due  . Tetanus Vaccine  08/15/1947  . Pneumonia vaccines (2 of 2 - PCV13) 11/11/2016  . Urine Protein Check  06/06/2017  . Hemoglobin A1C  08/15/2017  . Eye exam for diabetics  11/01/2017  . Complete foot exam   02/15/2018  . Flu Shot  Completed

## 2017-05-16 ENCOUNTER — Encounter: Payer: Self-pay | Admitting: Family Medicine

## 2017-05-16 ENCOUNTER — Other Ambulatory Visit: Payer: Self-pay | Admitting: Family Medicine

## 2017-05-16 ENCOUNTER — Ambulatory Visit (INDEPENDENT_AMBULATORY_CARE_PROVIDER_SITE_OTHER): Payer: Medicare Other | Admitting: Family Medicine

## 2017-05-16 VITALS — BP 122/62 | HR 75 | Ht 65.5 in | Wt 207.0 lb

## 2017-05-16 DIAGNOSIS — N183 Chronic kidney disease, stage 3 (moderate): Secondary | ICD-10-CM

## 2017-05-16 DIAGNOSIS — E1122 Type 2 diabetes mellitus with diabetic chronic kidney disease: Secondary | ICD-10-CM

## 2017-05-16 DIAGNOSIS — C61 Malignant neoplasm of prostate: Secondary | ICD-10-CM | POA: Diagnosis not present

## 2017-05-16 DIAGNOSIS — I10 Essential (primary) hypertension: Secondary | ICD-10-CM | POA: Diagnosis not present

## 2017-05-16 LAB — BAYER DCA HB A1C WAIVED: HB A1C (BAYER DCA - WAIVED): 12.4 % — ABNORMAL HIGH

## 2017-05-16 MED ORDER — METOPROLOL SUCCINATE ER 25 MG PO TB24: 25.0000 mg | ORAL_TABLET | Freq: Every day | ORAL | 1 refills | Status: DC

## 2017-05-16 MED ORDER — GLIMEPIRIDE 4 MG PO TABS: 4.0000 mg | ORAL_TABLET | Freq: Every day | ORAL | 1 refills | Status: DC

## 2017-05-16 MED ORDER — LINAGLIPTIN 5 MG PO TABS: 5.0000 mg | ORAL_TABLET | Freq: Every day | ORAL | 3 refills | Status: DC

## 2017-05-16 MED ORDER — GLIMEPIRIDE 1 MG PO TABS: 1.0000 mg | ORAL_TABLET | Freq: Every day | ORAL | 1 refills | Status: DC

## 2017-05-16 MED ORDER — AMLODIPINE BESYLATE 5 MG PO TABS: ORAL_TABLET | ORAL | 1 refills | Status: DC

## 2017-05-16 MED ORDER — ATORVASTATIN CALCIUM 20 MG PO TABS: 20.0000 mg | ORAL_TABLET | Freq: Every day | ORAL | 1 refills | Status: DC

## 2017-05-16 NOTE — Progress Notes (Signed)
Subjective:  Patient ID: Victor Journey., male    DOB: 1926/09/30  Age: 84 y.o. MRN: [id number]  CC: Diabetes (pt here today for routine follow up of his chronic medical conditions)   HPI Victor Tapia. presents forFollow-up of diabetes.  Patient tells me that he is 84 years old and he does not need to check his sugar he will just go with what his A1c says.  That is what he is here for.  Additionally he is going to eat what he wants for the same reason.  He has no unchanging unchanging at this point in life.  He just wants to enjoy himself.  Patient denies symptoms such as polyuria, polydipsia, excessive hunger, nausea No significant hypoglycemic spells noted. Medications reviewed. Pt reports taking them regularly without complication/adverse reaction being reported today.   Patient is also due for a follow-up PSA due to his prostate cancer he is off of his hormonal treatment and is being monitored through PSAs to determine risk of recurrence.   Follow-up of hypertension. Patient has no history of headache chest pain or shortness of breath or recent cough. Patient also denies symptoms of TIA such as numbness weakness lateralizing.  Patient denies side effects from his medication. States taking it regularly.  History Victor Tapia has a past medical history of CAD (coronary artery disease), Diabetes mellitus, Hyperlipidemia, Hypertension, OA (osteoarthritis), and Prostate cancer ([place]) (1999).   He has a past surgical history that includes TRIPLE BY PASS (1999); CATARACT  SURGERY RIGHT EYE (4/10); Knee surgery (Right); and Cataract extraction w/PHACO (Left, 12/09/2016).   His family history includes Cancer in his brother and sister.He reports that he quit smoking about 54 years ago. His smoking use included cigarettes. He has a 10.00 pack-year smoking history. He has never used smokeless tobacco. He reports that he drinks about 1.8 oz of alcohol per week. He reports that he does not use drugs.  Current  Outpatient Medications on File Prior to Visit  Medication Sig Dispense Refill  . aspirin 325 MG tablet Take 325 mg by mouth daily.      . Multiple Vitamins-Minerals (MENS 50+ MULTI VITAMIN/MIN PO) Take 1 tablet by mouth daily.     . tamsulosin (FLOMAX) 0.4 MG CAPS capsule Take 2 capsules (0.8 mg total) by mouth at bedtime. For urine flow and prostate 180 capsule 3   No current facility-administered medications on file prior to visit.     ROS Review of Systems  Constitutional: Negative.   HENT: Negative.   Eyes: Negative for visual disturbance.  Respiratory: Negative for cough and shortness of breath.   Cardiovascular: Negative for chest pain and leg swelling.  Gastrointestinal: Negative for abdominal pain, diarrhea, nausea and vomiting.  Genitourinary: Negative for difficulty urinating.  Musculoskeletal: Negative for arthralgias and myalgias.  Skin: Negative for rash.  Neurological: Negative for headaches.  Psychiatric/Behavioral: Negative for sleep disturbance.    Objective:  BP 122/62   Pulse 75   Ht 5' 5.5" (1.664 m)   Wt 207 lb (93.9 kg)   BMI 33.92 kg/m   BP Readings from Last 3 Encounters:  05/16/17 122/62  04/20/17 118/64  02/15/17 124/66    Wt Readings from Last 3 Encounters:  05/16/17 207 lb (93.9 kg)  04/20/17 205 lb (93 kg)  02/15/17 212 lb (96.2 kg)     Physical Exam  Constitutional: He is oriented to person, place, and time. He appears well-developed and well-nourished. No distress.  HENT:  Head: Normocephalic and atraumatic.  Right Ear: External ear normal.  Left Ear: External ear normal.  Nose: Nose normal.  Mouth/Throat: Oropharynx is clear and moist.  Eyes: Pupils are equal, round, and reactive to light. Conjunctivae and EOM are normal.  Neck: Normal range of motion. Neck supple. No thyromegaly present.  Cardiovascular: Normal rate, regular rhythm and normal heart sounds.  No murmur heard. Pulmonary/Chest: Effort normal and breath sounds  normal. No respiratory distress. He has no wheezes. He has no rales.  Abdominal: Soft. Bowel sounds are normal. He exhibits no distension. There is no tenderness.  Lymphadenopathy:    He has no cervical adenopathy.  Neurological: He is alert and oriented to person, place, and time. He has normal reflexes.  Skin: Skin is warm and dry.  Psychiatric: He has a normal mood and affect. His behavior is normal. Judgment and thought content normal.      Assessment & Plan:   Victor Tapia was seen today for diabetes.  Diagnoses and all orders for this visit:  Type 2 diabetes mellitus with stage 3 chronic kidney disease, without long-term current use of insulin (HCC) -     CMP14+EGFR -     Bayer DCA Hb A1c Waived  Prostate CA (HCC) -     PSA, total and free  Essential hypertension, benign  Other orders -     amLODipine (NORVASC) 5 MG tablet; TAKE 1 TABLET DAILY -     atorvastatin (LIPITOR) 20 MG tablet; Take 1 tablet (20 mg total) by mouth daily at 6 PM. -     linagliptin (TRADJENTA) 5 MG TABS tablet; Take 1 tablet (5 mg total) by mouth daily. For diabetes -     metoprolol succinate (TOPROL XL) 25 MG 24 hr tablet; Take 1 tablet (25 mg total) by mouth daily. -     Discontinue: glimepiride (AMARYL) 1 MG tablet; Take 1 tablet (1 mg total) by mouth daily with breakfast.      I have discontinued Victor Humble Jr.'s glimepiride. I am also having him maintain his aspirin, Multiple Vitamins-Minerals (MENS 50+ MULTI VITAMIN/MIN PO), tamsulosin, amLODipine, atorvastatin, linagliptin, and metoprolol succinate.  Meds ordered this encounter  Medications  . amLODipine (NORVASC) 5 MG tablet    Sig: TAKE 1 TABLET DAILY    Dispense:  90 tablet    Refill:  1  . atorvastatin (LIPITOR) 20 MG tablet    Sig: Take 1 tablet (20 mg total) by mouth daily at 6 PM.    Dispense:  90 tablet    Refill:  1  . linagliptin (TRADJENTA) 5 MG TABS tablet    Sig: Take 1 tablet (5 mg total) by mouth daily. For diabetes     Dispense:  90 tablet    Refill:  3  . metoprolol succinate (TOPROL XL) 25 MG 24 hr tablet    Sig: Take 1 tablet (25 mg total) by mouth daily.    Dispense:  90 tablet    Refill:  1  . DISCONTD: glimepiride (AMARYL) 1 MG tablet    Sig: Take 1 tablet (1 mg total) by mouth daily with breakfast.    Dispense:  90 tablet    Refill:  1   Although the patient is blatantly noncompliant, it is hard to blame him based on his advanced age.  Clearly it will be necessary to use medication as his primary modality for treatment for his diabetes.  Follow-up: Return in about 3 months (around 08/15/2017).  Claretta Fraise, M.D.

## 2017-05-17 ENCOUNTER — Encounter: Payer: Self-pay | Admitting: Family Medicine

## 2017-05-17 LAB — CMP14+EGFR
A/G RATIO: 1.3 (ref 1.2–2.2)
ALBUMIN: 3.9 g/dL (ref 3.5–4.7)
ALT: 18 IU/L (ref 0–44)
AST: 23 IU/L (ref 0–40)
Alkaline Phosphatase: 115 IU/L (ref 39–117)
BILIRUBIN TOTAL: 0.4 mg/dL (ref 0.0–1.2)
BUN / CREAT RATIO: 13 (ref 10–24)
BUN: 17 mg/dL (ref 8–27)
CHLORIDE: 99 mmol/L (ref 96–106)
CO2: 24 mmol/L (ref 20–29)
Calcium: 9.6 mg/dL (ref 8.6–10.2)
Creatinine, Ser: 1.29 mg/dL — ABNORMAL HIGH (ref 0.76–1.27)
GFR calc non Af Amer: 49 mL/min/{1.73_m2} — ABNORMAL LOW (ref >59)
GFR, EST AFRICAN AMERICAN: 57 mL/min/{1.73_m2} — AB (ref >59)
GLOBULIN, TOTAL: 3.1 g/dL (ref 1.5–4.5)
Glucose: 271 mg/dL — ABNORMAL HIGH (ref 65–99)
POTASSIUM: 4.6 mmol/L (ref 3.5–5.2)
SODIUM: 139 mmol/L (ref 134–144)
TOTAL PROTEIN: 7 g/dL (ref 6.0–8.5)

## 2017-05-17 LAB — PSA, TOTAL AND FREE
PSA, Free Pct: 10 %
PSA, Free: 0.02 ng/mL
Prostate Specific Ag, Serum: 0.2 ng/mL (ref 0.0–4.0)

## 2017-10-30 ENCOUNTER — Other Ambulatory Visit: Payer: Self-pay | Admitting: Family Medicine

## 2017-10-30 NOTE — Telephone Encounter (Signed)
Ov 11/15/17

## 2017-11-15 ENCOUNTER — Ambulatory Visit (INDEPENDENT_AMBULATORY_CARE_PROVIDER_SITE_OTHER): Payer: Medicare Other | Admitting: Family Medicine

## 2017-11-15 ENCOUNTER — Encounter: Payer: Self-pay | Admitting: Family Medicine

## 2017-11-15 VITALS — BP 137/74 | HR 67 | Temp 97.7°F | Ht 65.5 in | Wt 212.6 lb

## 2017-11-15 DIAGNOSIS — N183 Chronic kidney disease, stage 3 unspecified: Secondary | ICD-10-CM

## 2017-11-15 DIAGNOSIS — C61 Malignant neoplasm of prostate: Secondary | ICD-10-CM | POA: Diagnosis not present

## 2017-11-15 DIAGNOSIS — E1122 Type 2 diabetes mellitus with diabetic chronic kidney disease: Secondary | ICD-10-CM

## 2017-11-15 DIAGNOSIS — E782 Mixed hyperlipidemia: Secondary | ICD-10-CM

## 2017-11-15 DIAGNOSIS — I10 Essential (primary) hypertension: Secondary | ICD-10-CM

## 2017-11-15 DIAGNOSIS — Z23 Encounter for immunization: Secondary | ICD-10-CM

## 2017-11-15 NOTE — Progress Notes (Signed)
Subjective:  Patient ID: Victor Journey.,  male    DOB: Jul 17, 1926  Age: 84 y.o.    CC: Medical Management of Chronic Issues (six month recheck)   HPI Victor Paino. presents for  follow-up of hypertension. Patient has no history of headache chest pain or shortness of breath or recent cough. Patient also denies symptoms of TIA such as numbness weakness lateralizing. Patient denies side effects from medication. States taking it regularly.  Patient also  in for follow-up of elevated cholesterol. Doing well without complaints on current medication. Denies side effects  including myalgia and arthralgia and nausea. Also in today for liver function testing. Currently no chest pain, shortness of breath or other cardiovascular related symptoms noted.  Follow-up of diabetes. Patient does not check blood sugar at home. Trys to eat right andd get exercise regularly. Patient denies symptoms such as excessive hunger or urinary frequency, excessive hunger, nausea No significant hypoglycemic spells noted. Medications reviewed. Pt reports taking them regularly. Pt. denies complication/adverse reaction today.    History Victor Tapia has a past medical history of CAD (coronary artery disease), Diabetes mellitus, Hyperlipidemia, Hypertension, OA (osteoarthritis), and Prostate cancer ([place]) (1999).   He has a past surgical history that includes TRIPLE BY PASS (1999); CATARACT  SURGERY RIGHT EYE (4/10); Knee surgery (Right); and Cataract extraction w/PHACO (Left, 12/09/2016).   His family history includes Cancer in his brother and sister.He reports that he quit smoking about 54 years ago. His smoking use included cigarettes. He has a 10.00 pack-year smoking history. He has never used smokeless tobacco. He reports that he drinks about 3.0 standard drinks of alcohol per week. He reports that he does not use drugs.  Current Outpatient Medications on File Prior to Visit  Medication Sig Dispense Refill  . amLODipine  (NORVASC) 5 MG tablet TAKE 1 TABLET DAILY 90 tablet 1  . aspirin 325 MG tablet Take 325 mg by mouth daily.      Marland Kitchen atorvastatin (LIPITOR) 20 MG tablet Take 1 tablet (20 mg total) by mouth daily at 6 PM. 90 tablet 1  . glimepiride (AMARYL) 4 MG tablet Take 1 tablet (4 mg total) by mouth daily with breakfast. 90 tablet 1  . metoprolol succinate (TOPROL XL) 25 MG 24 hr tablet Take 1 tablet (25 mg total) by mouth daily. 90 tablet 1  . Multiple Vitamins-Minerals (MENS 50+ MULTI VITAMIN/MIN PO) Take 1 tablet by mouth daily.     . tamsulosin (FLOMAX) 0.4 MG CAPS capsule Take 2 capsules (0.8 mg total) by mouth at bedtime. For urine flow and prostate 180 capsule 3  . TRADJENTA 5 MG TABS tablet TAKE 1 TABLET DAILY FOR DIABETES 90 tablet 0   No current facility-administered medications on file prior to visit.     ROS Review of Systems  Constitutional: Negative.   HENT: Negative.   Eyes: Negative for visual disturbance.  Respiratory: Negative for cough and shortness of breath.   Cardiovascular: Negative for chest pain and leg swelling.  Gastrointestinal: Negative for abdominal pain, diarrhea, nausea and vomiting.  Genitourinary: Negative for difficulty urinating.  Musculoskeletal: Negative for arthralgias and myalgias.  Skin: Negative for rash.  Neurological: Negative for headaches.  Psychiatric/Behavioral: Negative for sleep disturbance.    Objective:  BP 137/74   Pulse 67   Temp 97.7 F (36.5 C) (Oral)   Ht 5' 5.5" (1.664 m)   Wt 212 lb 9.6 oz (96.4 kg)   BMI 34.84 kg/m   BP Readings from Last 3  Encounters:  11/15/17 137/74  05/16/17 122/62  04/20/17 118/64    Wt Readings from Last 3 Encounters:  11/15/17 212 lb 9.6 oz (96.4 kg)  05/16/17 207 lb (93.9 kg)  04/20/17 205 lb (93 kg)     Physical Exam  Constitutional: He is oriented to person, place, and time. He appears well-developed and well-nourished. No distress.  HENT:  Head: Normocephalic and atraumatic.  Right Ear:  External ear normal.  Left Ear: External ear normal.  Nose: Nose normal.  Mouth/Throat: Oropharynx is clear and moist.  Eyes: Pupils are equal, round, and reactive to light. Conjunctivae and EOM are normal.  Neck: Normal range of motion. Neck supple.  Cardiovascular: Normal rate, regular rhythm and normal heart sounds.  No murmur heard. Pulmonary/Chest: Effort normal and breath sounds normal. No respiratory distress. He has no wheezes. He has no rales.  Abdominal: Soft. There is no tenderness.  Musculoskeletal: Normal range of motion.  Neurological: He is alert and oriented to person, place, and time. He has normal reflexes.  Skin: Skin is warm and dry.  Psychiatric: He has a normal mood and affect. His behavior is normal. Judgment and thought content normal.    Diabetic Foot Exam - Simple   No data filed        Assessment & Plan:   Victor Tapia was seen today for medical management of chronic issues.  Diagnoses and all orders for this visit:  Type 2 diabetes mellitus with stage 3 chronic kidney disease, without long-term current use of insulin (HCC) -     CBC with Differential/Platelet -     CMP14+EGFR -     Microalbumin / creatinine urine ratio -     Cancel: Urinalysis -     Urinalysis  Encounter for immunization -     Flu vaccine HIGH DOSE PF  Mixed hyperlipidemia -     CBC with Differential/Platelet -     CMP14+EGFR  Essential hypertension, benign -     CBC with Differential/Platelet -     CMP14+EGFR -     Urinalysis  Prostate CA (HCC) -     PSA Total (Reflex To Free)   I am having Victor Journey. maintain his aspirin, Multiple Vitamins-Minerals (MENS 50+ MULTI VITAMIN/MIN PO), tamsulosin, amLODipine, atorvastatin, metoprolol succinate, glimepiride, and TRADJENTA.  MEds refilled as is for 6 mos.   Follow-up: Return in about 3 months (around 02/15/2018).  Claretta Fraise, M.D.

## 2017-11-16 LAB — CBC WITH DIFFERENTIAL/PLATELET
BASOS ABS: 0 10*3/uL (ref 0.0–0.2)
BASOS: 0 %
EOS (ABSOLUTE): 0.1 10*3/uL (ref 0.0–0.4)
Eos: 1 %
HEMOGLOBIN: 13 g/dL (ref 13.0–17.7)
Hematocrit: 39.1 % (ref 37.5–51.0)
Immature Grans (Abs): 0 10*3/uL (ref 0.0–0.1)
Immature Granulocytes: 0 %
LYMPHS ABS: 2.3 10*3/uL (ref 0.7–3.1)
LYMPHS: 25 %
MCH: 31 pg (ref 26.6–33.0)
MCHC: 33.2 g/dL (ref 31.5–35.7)
MCV: 93 fL (ref 79–97)
MONOCYTES: 7 %
Monocytes Absolute: 0.7 10*3/uL (ref 0.1–0.9)
NEUTROS ABS: 6.2 10*3/uL (ref 1.4–7.0)
Neutrophils: 67 %
Platelets: 231 10*3/uL (ref 150–450)
RBC: 4.19 x10E6/uL (ref 4.14–5.80)
RDW: 12.9 % (ref 12.3–15.4)
WBC: 9.4 10*3/uL (ref 3.4–10.8)

## 2017-11-16 LAB — MICROALBUMIN / CREATININE URINE RATIO
CREATININE, UR: 154.8 mg/dL
Microalb/Creat Ratio: 3 mg/g creat (ref 0.0–30.0)
Microalbumin, Urine: 4.6 ug/mL

## 2017-11-16 LAB — CMP14+EGFR
ALBUMIN: 4.1 g/dL (ref 3.5–4.7)
ALK PHOS: 106 IU/L (ref 39–117)
ALT: 16 IU/L (ref 0–44)
AST: 24 IU/L (ref 0–40)
Albumin/Globulin Ratio: 1.4 (ref 1.2–2.2)
BUN / CREAT RATIO: 15 (ref 10–24)
BUN: 24 mg/dL (ref 8–27)
Bilirubin Total: 0.3 mg/dL (ref 0.0–1.2)
CHLORIDE: 102 mmol/L (ref 96–106)
CO2: 25 mmol/L (ref 20–29)
Calcium: 9.4 mg/dL (ref 8.6–10.2)
Creatinine, Ser: 1.64 mg/dL — ABNORMAL HIGH (ref 0.76–1.27)
GFR calc non Af Amer: 37 mL/min/{1.73_m2} — ABNORMAL LOW (ref >59)
GFR, EST AFRICAN AMERICAN: 42 mL/min/{1.73_m2} — AB (ref >59)
GLOBULIN, TOTAL: 2.9 g/dL (ref 1.5–4.5)
GLUCOSE: 166 mg/dL — AB (ref 65–99)
Potassium: 4.4 mmol/L (ref 3.5–5.2)
Sodium: 141 mmol/L (ref 134–144)
TOTAL PROTEIN: 7 g/dL (ref 6.0–8.5)

## 2017-11-16 LAB — PSA TOTAL (REFLEX TO FREE): Prostate Specific Ag, Serum: 0.2 ng/mL (ref 0.0–4.0)

## 2017-11-17 DIAGNOSIS — I10 Essential (primary) hypertension: Secondary | ICD-10-CM | POA: Diagnosis not present

## 2017-11-17 DIAGNOSIS — N183 Chronic kidney disease, stage 3 (moderate): Secondary | ICD-10-CM | POA: Diagnosis not present

## 2017-11-17 DIAGNOSIS — E1122 Type 2 diabetes mellitus with diabetic chronic kidney disease: Secondary | ICD-10-CM | POA: Diagnosis not present

## 2017-11-17 LAB — URINALYSIS
BILIRUBIN UA: NEGATIVE
Glucose, UA: NEGATIVE
Ketones, UA: NEGATIVE
Leukocytes, UA: NEGATIVE
NITRITE UA: NEGATIVE
PH UA: 5.5 (ref 5.0–7.5)
Protein, UA: NEGATIVE
SPEC GRAV UA: 1.02 (ref 1.005–1.030)
Urobilinogen, Ur: 0.2 mg/dL (ref 0.2–1.0)

## 2018-01-26 ENCOUNTER — Other Ambulatory Visit: Payer: Self-pay | Admitting: Family Medicine

## 2018-02-12 ENCOUNTER — Other Ambulatory Visit: Payer: Self-pay | Admitting: Family Medicine

## 2018-02-16 ENCOUNTER — Ambulatory Visit (INDEPENDENT_AMBULATORY_CARE_PROVIDER_SITE_OTHER): Payer: Medicare Other | Admitting: Family Medicine

## 2018-02-16 ENCOUNTER — Encounter: Payer: Self-pay | Admitting: Family Medicine

## 2018-02-16 VITALS — BP 139/68 | HR 78 | Temp 97.6°F | Ht 65.5 in | Wt 213.0 lb

## 2018-02-16 DIAGNOSIS — E1122 Type 2 diabetes mellitus with diabetic chronic kidney disease: Secondary | ICD-10-CM

## 2018-02-16 DIAGNOSIS — I10 Essential (primary) hypertension: Secondary | ICD-10-CM | POA: Diagnosis not present

## 2018-02-16 DIAGNOSIS — E782 Mixed hyperlipidemia: Secondary | ICD-10-CM | POA: Diagnosis not present

## 2018-02-16 DIAGNOSIS — N183 Chronic kidney disease, stage 3 (moderate): Secondary | ICD-10-CM | POA: Diagnosis not present

## 2018-02-16 LAB — BAYER DCA HB A1C WAIVED: HB A1C: 8.1 % — AB (ref <7.0)

## 2018-02-16 NOTE — Patient Instructions (Signed)
Please follow up with Dr. Stacks in 3 months.  

## 2018-02-16 NOTE — Progress Notes (Signed)
Subjective:  Patient ID: Victor Journey.,  male    DOB: 07-29-1926  Age: 85 y.o.    CC: Follow-up   HPI Victor Tapia. presents for  follow-up of hypertension. Patient has no history of headache chest pain or shortness of breath or recent cough. Patient also denies symptoms of TIA such as numbness weakness lateralizing. Patient denies side effects from medication. States taking it regularly.  Patient also  in for follow-up of elevated cholesterol. Doing well without complaints on current medication. Denies side effects  including myalgia and arthralgia and nausea. Also in today for liver function testing. Currently no chest pain, shortness of breath or other cardiovascular related symptoms noted.  Follow-up of diabetes. Patient does not check blood sugar at home. Not on a diet. He feels that at his age he should be able to eat what he wants. Not worried about consequences - he's lived longer than he expected already. Patient denies symptoms such as excessive hunger or urinary frequency, excessive hunger, nausea No significant hypoglycemic spells noted. Medications reviewed. Pt reports taking them regularly. Pt. denies complication/adverse reaction today.    History Victor Tapia has a past medical history of CAD (coronary artery disease), Diabetes mellitus, Hyperlipidemia, Hypertension, OA (osteoarthritis), and Prostate cancer ([place]) (1999).   He has a past surgical history that includes TRIPLE BY PASS (1999); CATARACT  SURGERY RIGHT EYE (4/10); Knee surgery (Right); and Cataract extraction w/PHACO (Left, 12/09/2016).   His family history includes Cancer in his brother and sister.He reports that he quit smoking about 55 years ago. His smoking use included cigarettes. He has a 10.00 pack-year smoking history. He has never used smokeless tobacco. He reports current alcohol use of about 3.0 standard drinks of alcohol per week. He reports that he does not use drugs.  Current Outpatient Medications on  File Prior to Visit  Medication Sig Dispense Refill  . aspirin 325 MG tablet Take 325 mg by mouth daily.      Marland Kitchen atorvastatin (LIPITOR) 20 MG tablet Take 1 tablet (20 mg total) by mouth daily at 6 PM. 90 tablet 1  . glimepiride (AMARYL) 4 MG tablet TAKE 1 TABLET DAILY WITH BREAKFAST 90 tablet 0  . Multiple Vitamins-Minerals (MENS 50+ MULTI VITAMIN/MIN PO) Take 1 tablet by mouth daily.     . tamsulosin (FLOMAX) 0.4 MG CAPS capsule Take 2 capsules (0.8 mg total) by mouth at bedtime. For urine flow and prostate 180 capsule 3  . TOPROL XL 25 MG 24 hr tablet TAKE 1 TABLET DAILY 90 tablet 0  . TRADJENTA 5 MG TABS tablet TAKE 1 TABLET DAILY FOR DIABETES 90 tablet 0   No current facility-administered medications on file prior to visit.     ROS Review of Systems  Constitutional: Negative.   HENT: Negative.   Eyes: Negative for visual disturbance.  Respiratory: Negative for cough and shortness of breath.   Cardiovascular: Negative for chest pain and leg swelling.  Gastrointestinal: Negative for abdominal pain, diarrhea, nausea and vomiting.  Genitourinary: Negative for difficulty urinating.  Musculoskeletal: Negative for arthralgias and myalgias.  Skin: Negative for rash.  Neurological: Negative for headaches.  Psychiatric/Behavioral: Negative for sleep disturbance.    Objective:  BP 139/68   Pulse 78   Temp 97.6 F (36.4 C)   Ht 5' 5.5" (1.664 m)   Wt 213 lb (96.6 kg)   BMI 34.91 kg/m   BP Readings from Last 3 Encounters:  02/16/18 139/68  11/15/17 137/74  05/16/17 122/62  Wt Readings from Last 3 Encounters:  02/16/18 213 lb (96.6 kg)  11/15/17 212 lb 9.6 oz (96.4 kg)  05/16/17 207 lb (93.9 kg)     Physical Exam Constitutional:      General: He is not in acute distress.    Appearance: He is well-developed.  HENT:     Head: Normocephalic and atraumatic.     Right Ear: External ear normal.     Left Ear: External ear normal.     Nose: Nose normal.  Eyes:      Conjunctiva/sclera: Conjunctivae normal.     Pupils: Pupils are equal, round, and reactive to light.  Neck:     Musculoskeletal: Normal range of motion and neck supple.  Cardiovascular:     Rate and Rhythm: Normal rate and regular rhythm.     Heart sounds: Normal heart sounds. No murmur.  Pulmonary:     Effort: Pulmonary effort is normal. No respiratory distress.     Breath sounds: Normal breath sounds. No wheezing or rales.  Abdominal:     Palpations: Abdomen is soft.     Tenderness: There is no abdominal tenderness.  Musculoskeletal: Normal range of motion.  Skin:    General: Skin is warm and dry.  Neurological:     Mental Status: He is alert and oriented to person, place, and time.     Deep Tendon Reflexes: Reflexes are normal and symmetric.  Psychiatric:        Behavior: Behavior normal.        Thought Content: Thought content normal.        Judgment: Judgment normal.     Diabetic Foot Exam - Simple   No data filed        Assessment & Plan:   Victor Tapia was seen today for follow-up.  Diagnoses and all orders for this visit:  Type 2 diabetes mellitus with stage 3 chronic kidney disease, without long-term current use of insulin (HCC) -     CMP14+EGFR -     Cancel: HgB A1c -     Bayer DCA Hb A1c Waived  Mixed hyperlipidemia -     CMP14+EGFR  Essential hypertension, benign -     CMP14+EGFR   I am having Victor Journey. maintain his aspirin, Multiple Vitamins-Minerals (MENS 50+ MULTI VITAMIN/MIN PO), tamsulosin, atorvastatin, TRADJENTA, TOPROL XL, and glimepiride.  No orders of the defined types were placed in this encounter.    Follow-up: Return in about 3 months (around 05/18/2018).  Claretta Fraise, M.D.

## 2018-02-17 LAB — CMP14+EGFR
A/G RATIO: 1.3 (ref 1.2–2.2)
ALBUMIN: 4.1 g/dL (ref 3.5–4.7)
ALK PHOS: 122 IU/L — AB (ref 39–117)
ALT: 18 IU/L (ref 0–44)
AST: 22 IU/L (ref 0–40)
BILIRUBIN TOTAL: 0.2 mg/dL (ref 0.0–1.2)
BUN / CREAT RATIO: 21 (ref 10–24)
BUN: 28 mg/dL — AB (ref 8–27)
CHLORIDE: 100 mmol/L (ref 96–106)
CO2: 24 mmol/L (ref 20–29)
Calcium: 9.6 mg/dL (ref 8.6–10.2)
Creatinine, Ser: 1.36 mg/dL — ABNORMAL HIGH (ref 0.76–1.27)
GFR calc Af Amer: 53 mL/min/{1.73_m2} — ABNORMAL LOW (ref >59)
GFR calc non Af Amer: 46 mL/min/{1.73_m2} — ABNORMAL LOW (ref >59)
GLUCOSE: 182 mg/dL — AB (ref 65–99)
Globulin, Total: 3.2 g/dL (ref 1.5–4.5)
POTASSIUM: 4.4 mmol/L (ref 3.5–5.2)
Sodium: 141 mmol/L (ref 134–144)
Total Protein: 7.3 g/dL (ref 6.0–8.5)

## 2018-02-21 ENCOUNTER — Other Ambulatory Visit: Payer: Self-pay | Admitting: Family Medicine

## 2018-04-13 ENCOUNTER — Other Ambulatory Visit: Payer: Self-pay | Admitting: Family Medicine

## 2018-05-01 ENCOUNTER — Other Ambulatory Visit: Payer: Self-pay | Admitting: Family Medicine

## 2018-05-23 ENCOUNTER — Other Ambulatory Visit: Payer: Self-pay

## 2018-05-23 ENCOUNTER — Encounter: Payer: Self-pay | Admitting: Family Medicine

## 2018-05-23 ENCOUNTER — Ambulatory Visit (INDEPENDENT_AMBULATORY_CARE_PROVIDER_SITE_OTHER): Payer: Medicare Other | Admitting: Family Medicine

## 2018-05-23 DIAGNOSIS — N183 Chronic kidney disease, stage 3 unspecified: Secondary | ICD-10-CM

## 2018-05-23 DIAGNOSIS — I129 Hypertensive chronic kidney disease with stage 1 through stage 4 chronic kidney disease, or unspecified chronic kidney disease: Secondary | ICD-10-CM

## 2018-05-23 DIAGNOSIS — E1122 Type 2 diabetes mellitus with diabetic chronic kidney disease: Secondary | ICD-10-CM | POA: Diagnosis not present

## 2018-05-23 DIAGNOSIS — N400 Enlarged prostate without lower urinary tract symptoms: Secondary | ICD-10-CM | POA: Diagnosis not present

## 2018-05-23 DIAGNOSIS — E782 Mixed hyperlipidemia: Secondary | ICD-10-CM | POA: Diagnosis not present

## 2018-05-23 DIAGNOSIS — I10 Essential (primary) hypertension: Secondary | ICD-10-CM

## 2018-05-23 MED ORDER — AMLODIPINE BESYLATE 5 MG PO TABS: 5.0000 mg | ORAL_TABLET | Freq: Every day | ORAL | 1 refills | Status: DC

## 2018-05-23 MED ORDER — ATORVASTATIN CALCIUM 20 MG PO TABS: 20.0000 mg | ORAL_TABLET | Freq: Every day | ORAL | 1 refills | Status: DC

## 2018-05-23 MED ORDER — LINAGLIPTIN 5 MG PO TABS: ORAL_TABLET | ORAL | 1 refills | Status: DC

## 2018-05-23 NOTE — Progress Notes (Signed)
Subjective:  Patient ID: Victor Journey.,  male    DOB: [birth year]  Age: 85 y.o.    CC: No chief complaint on file.   HPI Victor Tapia. presents for  follow-up of hypertension. Patient has no history of headache chest pain or shortness of breath or recent cough. Patient also denies symptoms of TIA such as numbness weakness lateralizing. Patient denies side effects from medication. States taking it regularly.  Patient also  in for follow-up of elevated cholesterol. Doing well without complaints on current medication. Denies side effects  including myalgia and arthralgia and nausea. Also in today for liver function testing. Currently no chest pain, shortness of breath or other cardiovascular related symptoms noted.  Follow-up of diabetes. Patient does not check blood sugar at home.  Patient denies symptoms such as excessive hunger or urinary frequency, excessive hunger, nausea No significant hypoglycemic spells noted. Medications reviewed. Pt reports taking them regularly. Pt. denies complication/adverse reaction today.   Not taking tamsulosin because he doesn't have nocturia any more. Stream is good.    History Victor Tapia has a past medical history of CAD (coronary artery disease), Diabetes mellitus, Hyperlipidemia, Hypertension, OA (osteoarthritis), and Prostate cancer ([place]) (1999).   He has a past surgical history that includes TRIPLE BY PASS (1999); CATARACT  SURGERY RIGHT EYE (4/10); Knee surgery (Right); and Cataract extraction w/PHACO (Left, 12/09/2016).   His family history includes Cancer in his brother and sister.He reports that he quit smoking about 55 years ago. His smoking use included cigarettes. He has a 10.00 pack-year smoking history. He has never used smokeless tobacco. He reports current alcohol use of about 3.0 standard drinks of alcohol per week. He reports that he does not use drugs.  Current Outpatient Medications on File Prior to Visit  Medication Sig Dispense Refill   . aspirin 325 MG tablet Take 325 mg by mouth daily.      Marland Kitchen glimepiride (AMARYL) 4 MG tablet TAKE 1 TABLET DAILY WITH BREAKFAST 90 tablet 3  . Multiple Vitamins-Minerals (MENS 50+ MULTI VITAMIN/MIN PO) Take 1 tablet by mouth daily.     . TOPROL XL 25 MG 24 hr tablet TAKE 1 TABLET DAILY 90 tablet 1   No current facility-administered medications on file prior to visit.     ROS Review of Systems  Constitutional: Negative.   HENT: Negative.   Eyes: Negative for visual disturbance.  Respiratory: Negative for cough and shortness of breath.   Cardiovascular: Negative for chest pain and leg swelling.  Gastrointestinal: Negative for abdominal pain, diarrhea, nausea and vomiting.  Genitourinary: Negative for difficulty urinating.  Musculoskeletal: Negative for arthralgias and myalgias.  Skin: Negative for rash.  Neurological: Negative for headaches.  Psychiatric/Behavioral: Negative for sleep disturbance.    Objective:  There were no vitals taken for this visit.  BP Readings from Last 3 Encounters:  02/16/18 139/68  11/15/17 137/74  05/16/17 122/62    Wt Readings from Last 3 Encounters:  02/16/18 213 lb (96.6 kg)  11/15/17 212 lb 9.6 oz (96.4 kg)  05/16/17 207 lb (93.9 kg)     Physical Exam   Phone visit   Assessment & Plan:   Diagnoses and all orders for this visit:  Type 2 diabetes mellitus with stage 3 chronic kidney disease, without long-term current use of insulin (HCC)  Mixed hyperlipidemia  Essential hypertension, benign  Benign prostatic hyperplasia, unspecified whether lower urinary tract symptoms present  Other orders -     linagliptin (TRADJENTA) 5 MG TABS  tablet; TAKE 1 TABLET DAILY FOR DIABETES -     amLODipine (NORVASC) 5 MG tablet; Take 1 tablet (5 mg total) by mouth daily. -     atorvastatin (LIPITOR) 20 MG tablet; Take 1 tablet (20 mg total) by mouth daily at 6 PM.   I have discontinued Victor Humble Jr.'s tamsulosin. I have changed his Tradjenta  to linagliptin. I have also changed his amLODipine. Additionally, I am having him maintain his aspirin, Multiple Vitamins-Minerals (MENS 50+ MULTI VITAMIN/MIN PO), Toprol XL, glimepiride, and atorvastatin.  Meds ordered this encounter  Medications  . linagliptin (TRADJENTA) 5 MG TABS tablet    Sig: TAKE 1 TABLET DAILY FOR DIABETES    Dispense:  90 tablet    Refill:  1  . amLODipine (NORVASC) 5 MG tablet    Sig: Take 1 tablet (5 mg total) by mouth daily.    Dispense:  90 tablet    Refill:  1  . atorvastatin (LIPITOR) 20 MG tablet    Sig: Take 1 tablet (20 mg total) by mouth daily at 6 PM.    Dispense:  90 tablet    Refill:  1   Virtual Visit via telephone Note  I discussed the limitations, risks, security and privacy concerns of performing an evaluation and management service by telephone and the availability of in person appointments. I also discussed with the patient that there may be a patient responsible charge related to this service. The patient expressed understanding and agreed to proceed. Pt. Is at home. Dr. Livia Snellen is in his office.  Follow Up Instructions:   I discussed the assessment and treatment plan with the patient. The patient was provided an opportunity to ask questions and all were answered. The patient agreed with the plan and demonstrated an understanding of the instructions.   The patient was advised to call back or seek an in-person evaluation if the symptoms worsen or if the condition fails to improve as anticipated.  Visit started: 3:30 Call ended:  3:45 Total minutes including chart review and phone contact time: 20    Follow-up: Return in about 3 months (around 08/22/2018).  Claretta Fraise, M.D.

## 2018-08-15 ENCOUNTER — Other Ambulatory Visit: Payer: Self-pay | Admitting: Family Medicine

## 2018-08-20 ENCOUNTER — Ambulatory Visit (INDEPENDENT_AMBULATORY_CARE_PROVIDER_SITE_OTHER): Payer: Medicare Other | Admitting: Family Medicine

## 2018-08-20 ENCOUNTER — Encounter: Payer: Self-pay | Admitting: Family Medicine

## 2018-08-20 ENCOUNTER — Other Ambulatory Visit: Payer: Self-pay

## 2018-08-20 DIAGNOSIS — I10 Essential (primary) hypertension: Secondary | ICD-10-CM | POA: Diagnosis not present

## 2018-08-20 DIAGNOSIS — E1122 Type 2 diabetes mellitus with diabetic chronic kidney disease: Secondary | ICD-10-CM

## 2018-08-20 DIAGNOSIS — E782 Mixed hyperlipidemia: Secondary | ICD-10-CM | POA: Diagnosis not present

## 2018-08-20 DIAGNOSIS — N400 Enlarged prostate without lower urinary tract symptoms: Secondary | ICD-10-CM

## 2018-08-20 DIAGNOSIS — N183 Chronic kidney disease, stage 3 (moderate): Secondary | ICD-10-CM

## 2018-08-20 MED ORDER — TAMSULOSIN HCL 0.4 MG PO CAPS: 0.8000 mg | ORAL_CAPSULE | Freq: Every day | ORAL | 3 refills | Status: DC

## 2018-08-20 MED ORDER — METOPROLOL SUCCINATE ER 25 MG PO TB24: 25.0000 mg | ORAL_TABLET | Freq: Every day | ORAL | 1 refills | Status: DC

## 2018-08-20 NOTE — Progress Notes (Signed)
Subjective:    Patient ID: Victor Journey., male    DOB: October 16, 1926, 85 y.o.   MRN: [id number]   HPI: Victor Fowle. is a 85 y.o. male presenting for presents forFollow-up of diabetes. R efuses to check glucose. Patient denies symptoms such as polyuria, polydipsia, excessive hunger, nausea. No significant hypoglycemic spells noted. Medications reviewed. Pt reports taking them regularly without complication/adverse reaction being reported today.  Checking feet daily.  presents for  follow-up of hypertension. Patient has no history of headache chest pain or shortness of breath or recent cough. Patient also denies symptoms of TIA such as focal numbness or weakness. Patient denies side effects from medication. States taking it regularly. [place]    Depression screen [facility] 2/9 02/16/2018 11/15/2017 05/16/2017 04/20/2017 02/15/2017  Decreased Interest 0 0 0 0 0  Down, Depressed, Hopeless 0 0 0 0 0  PHQ - 2 Score 0 0 0 0 0     Relevant past medical, surgical, family and social history reviewed and updated as indicated.  Interim medical history since our last visit reviewed. Allergies and medications reviewed and updated.  ROS:  Review of Systems  Constitutional: Negative.   HENT: Negative.   Eyes: Negative for visual disturbance.  Respiratory: Negative for cough and shortness of breath.   Cardiovascular: Negative for chest pain and leg swelling.  Gastrointestinal: Negative for abdominal pain, diarrhea, nausea and vomiting.  Genitourinary: Negative for difficulty urinating.  Musculoskeletal: Negative for arthralgias and myalgias.  Skin: Negative for rash.  Neurological: Negative for headaches.  Psychiatric/Behavioral: Negative for sleep disturbance.     Social History   Tobacco Use  Smoking Status Former Smoker  . Packs/day: 1.00  . Years: 10.00  . Pack years: 10.00  . Types: Cigarettes  . Quit date: 12/08/1962  . Years since quitting: 55.7   Smokeless Tobacco Never Used       Objective:     Wt Readings from Last 3 Encounters:  02/16/18 213 lb (96.6 kg)  11/15/17 212 lb 9.6 oz (96.4 kg)  05/16/17 207 lb (93.9 kg)     Exam deferred. Pt. Harboring due to COVID 19. Phone visit performed.   Assessment & Plan:   1. Type 2 diabetes mellitus with stage 3 chronic kidney disease, without long-term current use of insulin ([place])   2. Mixed hyperlipidemia   3. Essential hypertension, benign   4. Benign prostatic hyperplasia, unspecified whether lower urinary tract symptoms present     Meds ordered this encounter  Medications  . metoprolol succinate (TOPROL XL) 25 MG 24 hr tablet    Sig: Take 1 tablet (25 mg total) by mouth daily.    Dispense:  90 tablet    Refill:  1  . tamsulosin (FLOMAX) 0.4 MG CAPS capsule    Sig: Take 2 capsules (0.8 mg total) by mouth at bedtime. For urine flow and prostate    Dispense:  180 capsule    Refill:  3    Orders Placed This Encounter  Procedures  . Bayer DCA Hb A1c Waived  . CBC with Differential/Platelet  . CMP14+EGFR    Order Specific Question:   Has the patient fasted?    Answer:   Yes  . Lipid panel    Order Specific Question:   Has the patient fasted?    Answer:   Yes      Diagnoses and all orders for this visit:  Type 2 diabetes mellitus with stage 3 chronic  kidney disease, without long-term current use of insulin (HCC) -     Bayer DCA Hb A1c Waived -     CBC with Differential/Platelet -     CMP14+EGFR -     Lipid panel  Mixed hyperlipidemia -     Bayer DCA Hb A1c Waived -     CBC with Differential/Platelet -     CMP14+EGFR -     Lipid panel  Essential hypertension, benign -     Bayer DCA Hb A1c Waived -     CBC with Differential/Platelet -     CMP14+EGFR -     Lipid panel  Benign prostatic hyperplasia, unspecified whether lower urinary tract symptoms present  Other orders -     metoprolol succinate (TOPROL XL) 25 MG 24 hr tablet; Take 1 tablet (25 mg total)  by mouth daily. -     tamsulosin (FLOMAX) 0.4 MG CAPS capsule; Take 2 capsules (0.8 mg total) by mouth at bedtime. For urine flow and prostate    Virtual Visit via telephone Note  I discussed the limitations, risks, security and privacy concerns of performing an evaluation and management service by telephone and the availability of in person appointments. The patient was identified with two identifiers. Pt.expressed understanding and agreed to proceed. Pt. Is at home. Dr. Livia Snellen is in his office.  Follow Up Instructions:   I discussed the assessment and treatment plan with the patient. The patient was provided an opportunity to ask questions and all were answered. The patient agreed with the plan and demonstrated an understanding of the instructions.Continue meds as is for now. Will drop in for blood work in two days.   The patient was advised to call back or seek an in-person evaluation if the symptoms worsen or if the condition fails to improve as anticipated.   Total minutes including chart review and phone contact time: 20   Follow up plan: Return in about 3 months (around 11/20/2018).  Claretta Fraise, MD [place]

## 2018-08-22 ENCOUNTER — Other Ambulatory Visit: Payer: Medicare Other

## 2018-08-22 ENCOUNTER — Other Ambulatory Visit: Payer: Self-pay

## 2018-08-22 DIAGNOSIS — I10 Essential (primary) hypertension: Secondary | ICD-10-CM | POA: Diagnosis not present

## 2018-08-22 DIAGNOSIS — E1122 Type 2 diabetes mellitus with diabetic chronic kidney disease: Secondary | ICD-10-CM | POA: Diagnosis not present

## 2018-08-22 DIAGNOSIS — N183 Chronic kidney disease, stage 3 (moderate): Secondary | ICD-10-CM | POA: Diagnosis not present

## 2018-08-22 DIAGNOSIS — E782 Mixed hyperlipidemia: Secondary | ICD-10-CM | POA: Diagnosis not present

## 2018-08-22 LAB — BAYER DCA HB A1C WAIVED: HB A1C (BAYER DCA - WAIVED): 7.5 % — ABNORMAL HIGH (ref <7.0)

## 2018-08-23 LAB — CBC WITH DIFFERENTIAL/PLATELET
Basophils Absolute: 0 10*3/uL (ref 0.0–0.2)
Basos: 0 %
EOS (ABSOLUTE): 0.1 10*3/uL (ref 0.0–0.4)
Eos: 1 %
Hematocrit: 39.9 % (ref 37.5–51.0)
Hemoglobin: 13.5 g/dL (ref 13.0–17.7)
Immature Grans (Abs): 0 10*3/uL (ref 0.0–0.1)
Immature Granulocytes: 0 %
Lymphocytes Absolute: 2.2 10*3/uL (ref 0.7–3.1)
Lymphs: 21 %
MCH: 31.2 pg (ref 26.6–33.0)
MCHC: 33.8 g/dL (ref 31.5–35.7)
MCV: 92 fL (ref 79–97)
Monocytes Absolute: 0.7 10*3/uL (ref 0.1–0.9)
Monocytes: 7 %
Neutrophils Absolute: 7.3 10*3/uL — ABNORMAL HIGH (ref 1.4–7.0)
Neutrophils: 71 %
Platelets: 214 10*3/uL (ref 150–450)
RBC: 4.33 x10E6/uL (ref 4.14–5.80)
RDW: 12.5 % (ref 11.6–15.4)
WBC: 10.4 10*3/uL (ref 3.4–10.8)

## 2018-08-23 LAB — CMP14+EGFR
ALT: 20 IU/L (ref 0–44)
AST: 23 IU/L (ref 0–40)
Albumin/Globulin Ratio: 1.4 (ref 1.2–2.2)
Albumin: 4 g/dL (ref 3.5–4.6)
Alkaline Phosphatase: 107 IU/L (ref 39–117)
BUN/Creatinine Ratio: 15 (ref 10–24)
BUN: 21 mg/dL (ref 10–36)
Bilirubin Total: 0.4 mg/dL (ref 0.0–1.2)
CO2: 22 mmol/L (ref 20–29)
Calcium: 9.4 mg/dL (ref 8.6–10.2)
Chloride: 102 mmol/L (ref 96–106)
Creatinine, Ser: 1.39 mg/dL — ABNORMAL HIGH (ref 0.76–1.27)
GFR calc Af Amer: 51 mL/min/{1.73_m2} — ABNORMAL LOW (ref >59)
GFR calc non Af Amer: 44 mL/min/{1.73_m2} — ABNORMAL LOW (ref >59)
Globulin, Total: 2.8 g/dL (ref 1.5–4.5)
Glucose: 181 mg/dL — ABNORMAL HIGH (ref 65–99)
Potassium: 4.6 mmol/L (ref 3.5–5.2)
Sodium: 142 mmol/L (ref 134–144)
Total Protein: 6.8 g/dL (ref 6.0–8.5)

## 2018-08-23 LAB — LIPID PANEL
Chol/HDL Ratio: 2.8 ratio (ref 0.0–5.0)
Cholesterol, Total: 132 mg/dL (ref 100–199)
HDL: 47 mg/dL (ref >39)
LDL Calculated: 55 mg/dL (ref 0–99)
Triglycerides: 148 mg/dL (ref 0–149)
VLDL Cholesterol Cal: 30 mg/dL (ref 5–40)

## 2018-10-17 ENCOUNTER — Other Ambulatory Visit: Payer: Self-pay

## 2018-10-18 ENCOUNTER — Ambulatory Visit (INDEPENDENT_AMBULATORY_CARE_PROVIDER_SITE_OTHER): Payer: Medicare Other

## 2018-10-18 DIAGNOSIS — Z23 Encounter for immunization: Secondary | ICD-10-CM | POA: Diagnosis not present

## 2018-11-12 LAB — HM DIABETES EYE EXAM

## 2018-11-13 ENCOUNTER — Other Ambulatory Visit: Payer: Self-pay | Admitting: Family Medicine

## 2018-12-27 ENCOUNTER — Other Ambulatory Visit: Payer: Self-pay | Admitting: Family Medicine

## 2019-01-30 ENCOUNTER — Other Ambulatory Visit: Payer: Self-pay | Admitting: Family Medicine

## 2019-01-30 NOTE — Telephone Encounter (Signed)
[facility]. NTBS LOV in July was to be seen for 3 mos. Mail order not sent

## 2019-02-12 ENCOUNTER — Telehealth: Payer: Self-pay | Admitting: Family Medicine

## 2019-02-12 MED ORDER — AMLODIPINE BESYLATE 5 MG PO TABS: 5.0000 mg | ORAL_TABLET | Freq: Every day | ORAL | 0 refills | Status: DC

## 2019-02-12 NOTE — Telephone Encounter (Signed)
Patient aware, script is ready. 

## 2019-02-12 NOTE — Telephone Encounter (Signed)
Pt had labs 08/22/18 and has appt scheduled on 02/27/19 with [facility] but will be out of medication prior to then. [facility] and pt aware.

## 2019-02-12 NOTE — Telephone Encounter (Signed)
What is the name of the medication? amLODipine (NORVASC) 5 MG tablet    Have you contacted your pharmacy to request a refill? YES  Which pharmacy would you like this sent to? [facility], has appt with Dr. Livia Snellen on 02/27/19 will run out before then    Patient notified that their request is being sent to the clinical staff for review and that they should receive a call once it is complete. If they do not receive a call within 24 hours they can check with their pharmacy or our office.

## 2019-02-26 ENCOUNTER — Other Ambulatory Visit: Payer: Self-pay

## 2019-02-27 ENCOUNTER — Ambulatory Visit (INDEPENDENT_AMBULATORY_CARE_PROVIDER_SITE_OTHER): Payer: Medicare Other | Admitting: Family Medicine

## 2019-02-27 ENCOUNTER — Encounter: Payer: Self-pay | Admitting: Family Medicine

## 2019-02-27 VITALS — BP 130/62 | HR 74 | Temp 96.8°F | Ht 65.5 in | Wt 216.8 lb

## 2019-02-27 DIAGNOSIS — I10 Essential (primary) hypertension: Secondary | ICD-10-CM

## 2019-02-27 DIAGNOSIS — N401 Enlarged prostate with lower urinary tract symptoms: Secondary | ICD-10-CM

## 2019-02-27 DIAGNOSIS — E782 Mixed hyperlipidemia: Secondary | ICD-10-CM | POA: Diagnosis not present

## 2019-02-27 DIAGNOSIS — Z125 Encounter for screening for malignant neoplasm of prostate: Secondary | ICD-10-CM | POA: Diagnosis not present

## 2019-02-27 DIAGNOSIS — E1122 Type 2 diabetes mellitus with diabetic chronic kidney disease: Secondary | ICD-10-CM

## 2019-02-27 DIAGNOSIS — N183 Chronic kidney disease, stage 3 unspecified: Secondary | ICD-10-CM

## 2019-02-27 DIAGNOSIS — R350 Frequency of micturition: Secondary | ICD-10-CM | POA: Diagnosis not present

## 2019-02-27 LAB — BAYER DCA HB A1C WAIVED: HB A1C (BAYER DCA - WAIVED): 8.2 % — ABNORMAL HIGH (ref <7.0)

## 2019-02-27 MED ORDER — METOPROLOL SUCCINATE ER 25 MG PO TB24: 25.0000 mg | ORAL_TABLET | Freq: Every day | ORAL | 1 refills | Status: DC

## 2019-02-27 MED ORDER — ATORVASTATIN CALCIUM 20 MG PO TABS: ORAL_TABLET | ORAL | 2 refills | Status: DC

## 2019-02-27 MED ORDER — TAMSULOSIN HCL 0.4 MG PO CAPS: 0.8000 mg | ORAL_CAPSULE | Freq: Every day | ORAL | 3 refills | Status: DC

## 2019-02-27 MED ORDER — GLIMEPIRIDE 4 MG PO TABS: 4.0000 mg | ORAL_TABLET | Freq: Every day | ORAL | 3 refills | Status: DC

## 2019-02-27 MED ORDER — LINAGLIPTIN 5 MG PO TABS: ORAL_TABLET | ORAL | 3 refills | Status: DC

## 2019-02-27 MED ORDER — AMLODIPINE BESYLATE 5 MG PO TABS: 5.0000 mg | ORAL_TABLET | Freq: Every day | ORAL | 0 refills | Status: DC

## 2019-02-27 NOTE — Patient Instructions (Signed)
Carbohydrate Counting for Diabetes Mellitus, Adult  Carbohydrate counting is a method of keeping track of how many carbohydrates you eat. Eating carbohydrates naturally increases the amount of sugar (glucose) in the blood. Counting how many carbohydrates you eat helps keep your blood glucose within normal limits, which helps you manage your diabetes (diabetes mellitus). It is important to know how many carbohydrates you can safely have in each meal. This is different for every person. A diet and nutrition specialist (registered dietitian) can help you make a meal plan and calculate how many carbohydrates you should have at each meal and snack. Carbohydrates are found in the following foods:  Grains, such as breads and cereals.  Dried beans and soy products.  Starchy vegetables, such as potatoes, peas, and corn.  Fruit and fruit juices.  Milk and yogurt.  Sweets and snack foods, such as cake, cookies, candy, chips, and soft drinks. How do I count carbohydrates? There are two ways to count carbohydrates in food. You can use either of the methods or a combination of both. Reading "Nutrition Facts" on packaged food The "Nutrition Facts" list is included on the labels of almost all packaged foods and beverages in the [place] It includes:  The serving size.  Information about nutrients in each serving, including the grams (g) of carbohydrate per serving. To use the "Nutrition Facts":  Decide how many servings you will have.  Multiply the number of servings by the number of carbohydrates per serving.  The resulting number is the total amount of carbohydrates that you will be having. Learning standard serving sizes of other foods When you eat carbohydrate foods that are not packaged or do not include "Nutrition Facts" on the label, you need to measure the servings in order to count the amount of carbohydrates:  Measure the foods that you will eat with a food scale or measuring cup, if  needed.  Decide how many standard-size servings you will eat.  Multiply the number of servings by 15. Most carbohydrate-rich foods have about 15 g of carbohydrates per serving. ? For example, if you eat 8 oz (170 g) of strawberries, you will have eaten 2 servings and 30 g of carbohydrates (2 servings x 15 g = 30 g).  For foods that have more than one food mixed, such as soups and casseroles, you must count the carbohydrates in each food that is included. The following list contains standard serving sizes of common carbohydrate-rich foods. Each of these servings has about 15 g of carbohydrates:   hamburger bun or  English muffin.   oz (15 mL) syrup.   oz (14 g) jelly.  1 slice of bread.  1 six-inch tortilla.  3 oz (85 g) cooked rice or pasta.  4 oz (113 g) cooked dried beans.  4 oz (113 g) starchy vegetable, such as peas, corn, or potatoes.  4 oz (113 g) hot cereal.  4 oz (113 g) mashed potatoes or  of a large baked potato.  4 oz (113 g) canned or frozen fruit.  4 oz (120 mL) fruit juice.  4-6 crackers.  6 chicken nuggets.  6 oz (170 g) unsweetened dry cereal.  6 oz (170 g) plain fat-free yogurt or yogurt sweetened with artificial sweeteners.  8 oz (240 mL) milk.  8 oz (170 g) fresh fruit or one small piece of fruit.  24 oz (680 g) popped popcorn. Example of carbohydrate counting Sample meal  3 oz (85 g) chicken breast.  6 oz (170 g)   brown rice.  4 oz (113 g) corn.  8 oz (240 mL) milk.  8 oz (170 g) strawberries with sugar-free whipped topping. Carbohydrate calculation 1. Identify the foods that contain carbohydrates: ? Rice. ? Corn. ? Milk. ? Strawberries. 2. Calculate how many servings you have of each food: ? 2 servings rice. ? 1 serving corn. ? 1 serving milk. ? 1 serving strawberries. 3. Multiply each number of servings by 15 g: ? 2 servings rice x 15 g = 30 g. ? 1 serving corn x 15 g = 15 g. ? 1 serving milk x 15 g = 15 g. ? 1  serving strawberries x 15 g = 15 g. 4. Add together all of the amounts to find the total grams of carbohydrates eaten: ? 30 g + 15 g + 15 g + 15 g = 75 g of carbohydrates total. Summary  Carbohydrate counting is a method of keeping track of how many carbohydrates you eat.  Eating carbohydrates naturally increases the amount of sugar (glucose) in the blood.  Counting how many carbohydrates you eat helps keep your blood glucose within normal limits, which helps you manage your diabetes.  A diet and nutrition specialist (registered dietitian) can help you make a meal plan and calculate how many carbohydrates you should have at each meal and snack. This information is not intended to replace advice given to you by your health care provider. Make sure you discuss any questions you have with your health care provider. Document Revised: 08/18/2016 Document Reviewed: 07/08/2015 Elsevier Patient Education  2020 [employer]  

## 2019-02-27 NOTE — Progress Notes (Signed)
Subjective:  Patient ID: Victor Tapia.,  male    DOB: 1926/03/10  Age: 86 y.o.    CC: Diabetes, Hypertension, and Hyperlipidemia   HPI Arinze Rivadeneira. presents for  follow-up of hypertension. Patient has no history of headache chest pain or shortness of breath or recent cough. Patient also denies symptoms of TIA such as numbness weakness lateralizing. Patient denies side effects from medication. States taking it regularly.  Patient also  in for follow-up of elevated cholesterol. Doing well without complaints on current medication. Denies side effects  including myalgia and arthralgia and nausea. Also in today for liver function testing. Currently no chest pain, shortness of breath or other cardiovascular related symptoms noted.  Follow-up of diabetes. Patient does NOT check blood sugar at home. Patient denies symptoms such as excessive hunger or urinary frequency, excessive hunger, nausea No significant hypoglycemic spells noted. Medications reviewed. Pt reports taking them regularly. Pt. denies complication/adverse reaction today.    History Melquan has a past medical history of CAD (coronary artery disease), Diabetes mellitus, Hyperlipidemia, Hypertension, OA (osteoarthritis), and Prostate cancer ([place]) (1999).   He has a past surgical history that includes TRIPLE BY PASS (1999); CATARACT  SURGERY RIGHT EYE (4/10); Knee surgery (Right); and Cataract extraction w/PHACO (Left, 12/09/2016).   His family history includes Cancer in his brother and sister.He reports that he quit smoking about 56 years ago. His smoking use included cigarettes. He has a 10.00 pack-year smoking history. He has never used smokeless tobacco. He reports current alcohol use of about 3.0 standard drinks of alcohol per week. He reports that he does not use drugs.  Current Outpatient Medications on File Prior to Visit  Medication Sig Dispense Refill  . aspirin 325 MG tablet Take 325 mg by mouth daily.      . Multiple  Vitamins-Minerals (MENS 50+ MULTI VITAMIN/MIN PO) Take 1 tablet by mouth daily.      No current facility-administered medications on file prior to visit.    ROS Review of Systems  Constitutional: Negative.   HENT: Negative.   Eyes: Negative for visual disturbance.  Respiratory: Negative for cough and shortness of breath.   Cardiovascular: Negative for chest pain and leg swelling.  Gastrointestinal: Negative for abdominal pain, diarrhea, nausea and vomiting.  Genitourinary: Negative for difficulty urinating.  Musculoskeletal: Negative for arthralgias and myalgias.  Skin: Negative for rash.  Neurological: Negative for headaches.  Psychiatric/Behavioral: Negative for sleep disturbance.    Objective:  BP 130/62   Pulse 74   Temp (!) 96.8 F (36 C) (Temporal)   Ht 5' 5.5" (1.664 m)   Wt 216 lb 12.8 oz (98.3 kg)   SpO2 97%   BMI 35.53 kg/m   BP Readings from Last 3 Encounters:  02/27/19 130/62  02/16/18 139/68  11/15/17 137/74    Wt Readings from Last 3 Encounters:  02/27/19 216 lb 12.8 oz (98.3 kg)  02/16/18 213 lb (96.6 kg)  11/15/17 212 lb 9.6 oz (96.4 kg)     Physical Exam Vitals reviewed.  Constitutional:      Appearance: He is well-developed.  HENT:     Head: Normocephalic and atraumatic.     Right Ear: External ear normal.     Left Ear: External ear normal.     Mouth/Throat:     Pharynx: No oropharyngeal exudate or posterior oropharyngeal erythema.  Eyes:     Pupils: Pupils are equal, round, and reactive to light.  Cardiovascular:     Rate and Rhythm:  Normal rate and regular rhythm.     Heart sounds: No murmur.  Pulmonary:     Effort: No respiratory distress.     Breath sounds: Normal breath sounds.  Musculoskeletal:     Cervical back: Normal range of motion and neck supple.  Neurological:     Mental Status: He is alert and oriented to person, place, and time.     Diabetic Foot Exam - Simple   No data filed        Assessment & Plan:    Audiel was seen today for diabetes, hypertension and hyperlipidemia.  Diagnoses and all orders for this visit:  Type 2 diabetes mellitus with stage 3 chronic kidney disease, without long-term current use of insulin, unspecified whether stage 3a or 3b CKD (HCC) -     Bayer DCA Hb A1c Waived -     CBC with Differential/Platelet -     CMP14+EGFR -     Lipid panel -     PSA Total (Reflex To Free) -     glimepiride (AMARYL) 4 MG tablet; Take 1 tablet (4 mg total) by mouth daily with breakfast. -     atorvastatin (LIPITOR) 20 MG tablet; TAKE 1 TABLET DAILY AT 6 P.M. -     linagliptin (TRADJENTA) 5 MG TABS tablet; TAKE 1 TABLET DAILY FOR DIABETES  Mixed hyperlipidemia -     CMP14+EGFR -     Lipid panel -     atorvastatin (LIPITOR) 20 MG tablet; TAKE 1 TABLET DAILY AT 6 P.M.  Essential hypertension, benign -     CMP14+EGFR -     amLODipine (NORVASC) 5 MG tablet; Take 1 tablet (5 mg total) by mouth daily. -     metoprolol succinate (TOPROL XL) 25 MG 24 hr tablet; Take 1 tablet (25 mg total) by mouth daily.  Screening for prostate cancer -     PSA Total (Reflex To Free)  Benign prostatic hyperplasia with urinary frequency -     tamsulosin (FLOMAX) 0.4 MG CAPS capsule; Take 2 capsules (0.8 mg total) by mouth at bedtime. For urine flow and prostate   I have changed Kathrynn Humble Jr.'s Tradjenta to linagliptin. I have also changed his glimepiride. I am also having him maintain his aspirin, Multiple Vitamins-Minerals (MENS 50+ MULTI VITAMIN/MIN PO), atorvastatin, amLODipine, metoprolol succinate, and tamsulosin.  Meds ordered this encounter  Medications  . glimepiride (AMARYL) 4 MG tablet    Sig: Take 1 tablet (4 mg total) by mouth daily with breakfast.    Dispense:  90 tablet    Refill:  3  . atorvastatin (LIPITOR) 20 MG tablet    Sig: TAKE 1 TABLET DAILY AT 6 P.M.    Dispense:  90 tablet    Refill:  2  . amLODipine (NORVASC) 5 MG tablet    Sig: Take 1 tablet (5 mg total) by mouth  daily.    Dispense:  90 tablet    Refill:  0  . metoprolol succinate (TOPROL XL) 25 MG 24 hr tablet    Sig: Take 1 tablet (25 mg total) by mouth daily.    Dispense:  90 tablet    Refill:  1  . tamsulosin (FLOMAX) 0.4 MG CAPS capsule    Sig: Take 2 capsules (0.8 mg total) by mouth at bedtime. For urine flow and prostate    Dispense:  180 capsule    Refill:  3  . linagliptin (TRADJENTA) 5 MG TABS tablet    Sig: TAKE 1 TABLET  DAILY FOR DIABETES    Dispense:  90 tablet    Refill:  3     Follow-up: Return in about 3 months (around 05/28/2019).  Claretta Fraise, M.D.

## 2019-02-28 LAB — CMP14+EGFR
ALT: 15 IU/L (ref 0–44)
AST: 20 IU/L (ref 0–40)
Albumin/Globulin Ratio: 1.3 (ref 1.2–2.2)
Albumin: 3.9 g/dL (ref 3.5–4.6)
Alkaline Phosphatase: 112 IU/L (ref 39–117)
BUN/Creatinine Ratio: 18 (ref 10–24)
BUN: 23 mg/dL (ref 10–36)
Bilirubin Total: 0.3 mg/dL (ref 0.0–1.2)
CO2: 25 mmol/L (ref 20–29)
Calcium: 9.3 mg/dL (ref 8.6–10.2)
Chloride: 99 mmol/L (ref 96–106)
Creatinine, Ser: 1.3 mg/dL — ABNORMAL HIGH (ref 0.76–1.27)
GFR calc Af Amer: 56 mL/min/{1.73_m2} — ABNORMAL LOW (ref >59)
GFR calc non Af Amer: 48 mL/min/{1.73_m2} — ABNORMAL LOW (ref >59)
Globulin, Total: 3 g/dL (ref 1.5–4.5)
Glucose: 301 mg/dL — ABNORMAL HIGH (ref 65–99)
Potassium: 4 mmol/L (ref 3.5–5.2)
Sodium: 139 mmol/L (ref 134–144)
Total Protein: 6.9 g/dL (ref 6.0–8.5)

## 2019-02-28 LAB — CBC WITH DIFFERENTIAL/PLATELET
Basophils Absolute: 0 10*3/uL (ref 0.0–0.2)
Basos: 0 %
EOS (ABSOLUTE): 0.1 10*3/uL (ref 0.0–0.4)
Eos: 1 %
Hematocrit: 40.8 % (ref 37.5–51.0)
Hemoglobin: 13.4 g/dL (ref 13.0–17.7)
Immature Grans (Abs): 0 10*3/uL (ref 0.0–0.1)
Immature Granulocytes: 0 %
Lymphocytes Absolute: 1.6 10*3/uL (ref 0.7–3.1)
Lymphs: 17 %
MCH: 31.5 pg (ref 26.6–33.0)
MCHC: 32.8 g/dL (ref 31.5–35.7)
MCV: 96 fL (ref 79–97)
Monocytes Absolute: 0.6 10*3/uL (ref 0.1–0.9)
Monocytes: 6 %
Neutrophils Absolute: 7 10*3/uL (ref 1.4–7.0)
Neutrophils: 76 %
Platelets: 205 10*3/uL (ref 150–450)
RBC: 4.25 x10E6/uL (ref 4.14–5.80)
RDW: 12.5 % (ref 11.6–15.4)
WBC: 9.3 10*3/uL (ref 3.4–10.8)

## 2019-02-28 LAB — PSA TOTAL (REFLEX TO FREE): Prostate Specific Ag, Serum: 0.3 ng/mL (ref 0.0–4.0)

## 2019-02-28 LAB — LIPID PANEL
Chol/HDL Ratio: 2.6 ratio (ref 0.0–5.0)
Cholesterol, Total: 126 mg/dL (ref 100–199)
HDL: 49 mg/dL (ref >39)
LDL Chol Calc (NIH): 48 mg/dL (ref 0–99)
Triglycerides: 175 mg/dL — ABNORMAL HIGH (ref 0–149)
VLDL Cholesterol Cal: 29 mg/dL (ref 5–40)

## 2019-02-28 NOTE — Progress Notes (Signed)
Hello Buzzy,  With the exxception of the A1c, which we discussed yesterday, your lab result is normal and/or stable.Some minor variations that are not significant are commonly marked abnormal, but do not represent any medical problem for you.  Best regards, Claretta Fraise, M.D.

## 2019-04-23 ENCOUNTER — Telehealth: Payer: Self-pay | Admitting: Family Medicine

## 2019-04-23 DIAGNOSIS — E1122 Type 2 diabetes mellitus with diabetic chronic kidney disease: Secondary | ICD-10-CM

## 2019-04-23 NOTE — Chronic Care Management (AMB) (Signed)
  Chronic Care Management   Note  04/23/2019 Name: Victor Tapia. MRN: [id number] DOB: April 11, 1926  Victor Tapia. is a 86 y.o. year old male who is a primary care patient of Stacks, Cletus Gash, MD. I reached out to Radene Journey. by phone today in response to a referral sent by [name]'s health plan.     [name] was given information about Chronic Care Management services today including:  1. CCM service includes personalized support from designated clinical staff supervised by his physician, including individualized plan of care and coordination with other care providers 2. 24/7 contact phone numbers for assistance for urgent and routine care needs. 3. Service will only be billed when office clinical staff spend 20 minutes or more in a month to coordinate care. 4. Only one practitioner may furnish and bill the service in a calendar month. 5. The patient may stop CCM services at any time (effective at the end of the month) by phone call to the office staff. 6. The patient will be responsible for cost sharing (co-pay) of up to 20% of the service fee (after annual deductible is met).  Patient agreed to services and verbal consent obtained.   Follow up plan: Telephone appointment with care management team member scheduled for:09/09/2019  Noreene Larsson, [address], [place], [place] [address] Direct Dial: [phone] [email or web address] Website: [email or web address]

## 2019-04-23 NOTE — Chronic Care Management (AMB) (Signed)
  Chronic Care Management   Outreach Note  04/23/2019 Name: Chesky Respress. MRN: [id number] DOB: [birth year]  Raymond Kallay. is a 86 y.o. year old male who is a primary care patient of Stacks, Cletus Gash, MD. I reached out to Radene Journey. by phone today in response to a referral sent by Mr. Josian Moreno Jr.'s health plan.     An unsuccessful telephone outreach was attempted today. The patient was referred to the case management team for assistance with care management and care coordination.   Follow Up Plan: A HIPPA compliant phone message was left for the patient providing contact information and requesting a return call.  The care management team will reach out to the patient again over the next 7 days.  If patient returns call to provider office, please advise to call [facility] [address], [place], [place], [place] [address] Direct Dial: [phone] [email or web address] Website: [email or web address]

## 2019-05-30 ENCOUNTER — Ambulatory Visit (INDEPENDENT_AMBULATORY_CARE_PROVIDER_SITE_OTHER): Payer: Medicare Other | Admitting: Family Medicine

## 2019-05-30 ENCOUNTER — Other Ambulatory Visit: Payer: Self-pay

## 2019-05-30 DIAGNOSIS — E1122 Type 2 diabetes mellitus with diabetic chronic kidney disease: Secondary | ICD-10-CM

## 2019-05-30 DIAGNOSIS — E782 Mixed hyperlipidemia: Secondary | ICD-10-CM

## 2019-05-30 DIAGNOSIS — N183 Chronic kidney disease, stage 3 unspecified: Secondary | ICD-10-CM

## 2019-05-30 DIAGNOSIS — I10 Essential (primary) hypertension: Secondary | ICD-10-CM

## 2019-06-10 ENCOUNTER — Other Ambulatory Visit: Payer: Medicare Other

## 2019-06-10 ENCOUNTER — Other Ambulatory Visit: Payer: Self-pay

## 2019-06-10 ENCOUNTER — Encounter: Payer: Self-pay | Admitting: Family Medicine

## 2019-06-10 DIAGNOSIS — E1122 Type 2 diabetes mellitus with diabetic chronic kidney disease: Secondary | ICD-10-CM | POA: Diagnosis not present

## 2019-06-10 DIAGNOSIS — I10 Essential (primary) hypertension: Secondary | ICD-10-CM | POA: Diagnosis not present

## 2019-06-10 DIAGNOSIS — N183 Chronic kidney disease, stage 3 unspecified: Secondary | ICD-10-CM | POA: Diagnosis not present

## 2019-06-10 DIAGNOSIS — E782 Mixed hyperlipidemia: Secondary | ICD-10-CM | POA: Diagnosis not present

## 2019-06-10 LAB — BAYER DCA HB A1C WAIVED: HB A1C (BAYER DCA - WAIVED): 7.6 % — ABNORMAL HIGH (ref <7.0)

## 2019-06-11 ENCOUNTER — Other Ambulatory Visit: Payer: Self-pay

## 2019-06-11 ENCOUNTER — Ambulatory Visit (INDEPENDENT_AMBULATORY_CARE_PROVIDER_SITE_OTHER): Payer: Medicare Other | Admitting: Family Medicine

## 2019-06-11 ENCOUNTER — Encounter: Payer: Self-pay | Admitting: Family Medicine

## 2019-06-11 VITALS — BP 131/72 | HR 89 | Temp 98.0°F | Ht 65.5 in | Wt 208.2 lb

## 2019-06-11 DIAGNOSIS — E1121 Type 2 diabetes mellitus with diabetic nephropathy: Secondary | ICD-10-CM | POA: Diagnosis not present

## 2019-06-11 DIAGNOSIS — N1832 Chronic kidney disease, stage 3b: Secondary | ICD-10-CM | POA: Diagnosis not present

## 2019-06-11 DIAGNOSIS — I10 Essential (primary) hypertension: Secondary | ICD-10-CM

## 2019-06-11 DIAGNOSIS — E1122 Type 2 diabetes mellitus with diabetic chronic kidney disease: Secondary | ICD-10-CM

## 2019-06-11 DIAGNOSIS — E782 Mixed hyperlipidemia: Secondary | ICD-10-CM | POA: Diagnosis not present

## 2019-06-11 LAB — CBC WITH DIFFERENTIAL/PLATELET
Basophils Absolute: 0 10*3/uL (ref 0.0–0.2)
Basos: 0 %
EOS (ABSOLUTE): 0.1 10*3/uL (ref 0.0–0.4)
Eos: 2 %
Hematocrit: 39.5 % (ref 37.5–51.0)
Hemoglobin: 13 g/dL (ref 13.0–17.7)
Immature Grans (Abs): 0 10*3/uL (ref 0.0–0.1)
Immature Granulocytes: 0 %
Lymphocytes Absolute: 2.5 10*3/uL (ref 0.7–3.1)
Lymphs: 26 %
MCH: 31.8 pg (ref 26.6–33.0)
MCHC: 32.9 g/dL (ref 31.5–35.7)
MCV: 97 fL (ref 79–97)
Monocytes Absolute: 0.6 10*3/uL (ref 0.1–0.9)
Monocytes: 7 %
Neutrophils Absolute: 6 10*3/uL (ref 1.4–7.0)
Neutrophils: 65 %
Platelets: 203 10*3/uL (ref 150–450)
RBC: 4.09 x10E6/uL — ABNORMAL LOW (ref 4.14–5.80)
RDW: 12.8 % (ref 11.6–15.4)
WBC: 9.3 10*3/uL (ref 3.4–10.8)

## 2019-06-11 LAB — CMP14+EGFR
ALT: 12 IU/L (ref 0–44)
AST: 16 IU/L (ref 0–40)
Albumin/Globulin Ratio: 1.5 (ref 1.2–2.2)
Albumin: 4 g/dL (ref 3.5–4.6)
Alkaline Phosphatase: 105 IU/L (ref 39–117)
BUN/Creatinine Ratio: 19 (ref 10–24)
BUN: 29 mg/dL (ref 10–36)
Bilirubin Total: 0.4 mg/dL (ref 0.0–1.2)
CO2: 23 mmol/L (ref 20–29)
Calcium: 9.5 mg/dL (ref 8.6–10.2)
Chloride: 106 mmol/L (ref 96–106)
Creatinine, Ser: 1.52 mg/dL — ABNORMAL HIGH (ref 0.76–1.27)
GFR calc Af Amer: 46 mL/min/{1.73_m2} — ABNORMAL LOW (ref >59)
GFR calc non Af Amer: 40 mL/min/{1.73_m2} — ABNORMAL LOW (ref >59)
Globulin, Total: 2.7 g/dL (ref 1.5–4.5)
Glucose: 151 mg/dL — ABNORMAL HIGH (ref 65–99)
Potassium: 4.9 mmol/L (ref 3.5–5.2)
Sodium: 143 mmol/L (ref 134–144)
Total Protein: 6.7 g/dL (ref 6.0–8.5)

## 2019-06-11 LAB — LIPID PANEL
Chol/HDL Ratio: 2.3 ratio (ref 0.0–5.0)
Cholesterol, Total: 115 mg/dL (ref 100–199)
HDL: 49 mg/dL (ref >39)
LDL Chol Calc (NIH): 47 mg/dL (ref 0–99)
Triglycerides: 103 mg/dL (ref 0–149)
VLDL Cholesterol Cal: 19 mg/dL (ref 5–40)

## 2019-06-11 LAB — MICROALBUMIN / CREATININE URINE RATIO
Creatinine, Urine: 121.5 mg/dL
Microalb/Creat Ratio: <2 mg/g creat (ref 0–29)
Microalbumin, Urine: <3 ug/mL

## 2019-06-11 NOTE — Progress Notes (Signed)
Subjective:  Patient ID: Victor Journey.,  male    DOB: 02-25-1926  Age: 86 y.o.    CC: Follow-up (1 month)   HPI Victor Holdaway. presents for  follow-up of hypertension. Patient has no history of headache chest pain or shortness of breath or recent cough. Patient also denies symptoms of TIA such as numbness weakness lateralizing. Patient denies side effects from medication. States taking it regularly.  Patient also  in for follow-up of elevated cholesterol. Doing well without complaints on current medication. Denies side effects  including myalgia and arthralgia and nausea. Also in today for liver function testing. Currently no chest pain, shortness of breath or other cardiovascular related symptoms noted.  Follow-up of diabetes.  Patient is following a better diet since he was here last in his A1c was elevated.  He is also exercising more he is walking an extra half a mile per week.  He is always been physically active he tells me.  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 Patient denies symptoms such as excessive hunger or urinary frequency, excessive hunger, nausea No significant hypoglycemic spells noted. Medications reviewed. Pt reports taking them regularly. Pt. denies complication/adverse reaction today.    History Victor Tapia has a past medical history of CAD (coronary artery disease), Diabetes mellitus, Hyperlipidemia, Hypertension, OA (osteoarthritis), and Prostate cancer ([place]) (1999).   He has a past surgical history that includes TRIPLE BY PASS (1999); CATARACT  SURGERY RIGHT EYE (4/10); Knee surgery (Right); and Cataract extraction w/PHACO (Left, 12/09/2016).   His family history includes Cancer in his brother and sister.He reports that he quit smoking about 56 years ago. His smoking use included cigarettes. He has a 10.00 pack-year smoking history. He has never used smokeless tobacco. He reports current  alcohol use of about 3.0 standard drinks of alcohol per week. He reports that he does not use drugs.  Current Outpatient Medications on File Prior to Visit  Medication Sig Dispense Refill  . amLODipine (NORVASC) 5 MG tablet Take 1 tablet (5 mg total) by mouth daily. 90 tablet 0  . aspirin 325 MG tablet Take 325 mg by mouth daily.      Marland Kitchen atorvastatin (LIPITOR) 20 MG tablet TAKE 1 TABLET DAILY AT 6 P.M. 90 tablet 2  . glimepiride (AMARYL) 4 MG tablet Take 1 tablet (4 mg total) by mouth daily with breakfast. 90 tablet 3  . linagliptin (TRADJENTA) 5 MG TABS tablet TAKE 1 TABLET DAILY FOR DIABETES 90 tablet 3  . metoprolol succinate (TOPROL XL) 25 MG 24 hr tablet Take 1 tablet (25 mg total) by mouth daily. 90 tablet 1  . Multiple Vitamins-Minerals (MENS 50+ MULTI VITAMIN/MIN PO) Take 1 tablet by mouth daily.     . tamsulosin (FLOMAX) 0.4 MG CAPS capsule Take 2 capsules (0.8 mg total) by mouth at bedtime. For urine flow and prostate 180 capsule 3   No current facility-administered medications on file prior to visit.    ROS Review of Systems  Constitutional: Negative.   HENT: Negative.   Eyes: Negative for visual disturbance.  Respiratory: Negative for cough and shortness of breath.   Cardiovascular: Negative for chest pain and leg swelling.  Gastrointestinal: Negative for abdominal pain, diarrhea, nausea and vomiting.  Genitourinary: Negative for difficulty urinating.  Musculoskeletal: Negative for arthralgias and myalgias.  Skin: Negative for rash.  Neurological: Negative for headaches.  Psychiatric/Behavioral: Negative for sleep disturbance.  Objective:  BP 131/72   Pulse 89   Temp 98 F (36.7 C) (Temporal)   Ht 5' 5.5" (1.664 m)   Wt 208 lb 3.2 oz (94.4 kg)   BMI 34.12 kg/m   BP Readings from Last 3 Encounters:  06/11/19 131/72  02/27/19 130/62  02/16/18 139/68    Wt Readings from Last 3 Encounters:  06/11/19 208 lb 3.2 oz (94.4 kg)  02/27/19 216 lb 12.8 oz (98.3 kg)    02/16/18 213 lb (96.6 kg)     Physical Exam Constitutional:      General: He is not in acute distress.    Appearance: He is well-developed.  HENT:     Head: Normocephalic and atraumatic.     Right Ear: External ear normal.     Left Ear: External ear normal.     Nose: Nose normal.  Eyes:     Conjunctiva/sclera: Conjunctivae normal.     Pupils: Pupils are equal, round, and reactive to light.  Cardiovascular:     Rate and Rhythm: Normal rate and regular rhythm.     Heart sounds: Normal heart sounds. No murmur.  Pulmonary:     Effort: Pulmonary effort is normal. No respiratory distress.     Breath sounds: Normal breath sounds. No wheezing or rales.  Abdominal:     Palpations: Abdomen is soft.     Tenderness: There is no abdominal tenderness.  Musculoskeletal:        General: Normal range of motion.     Cervical back: Normal range of motion and neck supple.  Skin:    General: Skin is warm and dry.  Neurological:     Mental Status: He is alert and oriented to person, place, and time.     Deep Tendon Reflexes: Reflexes are normal and symmetric.  Psychiatric:        Behavior: Behavior normal.        Thought Content: Thought content normal.        Judgment: Judgment normal.     Diabetic Foot Exam - Simple   Simple Foot Form Diabetic Foot exam was performed with the following findings: Yes 06/11/2019  3:43 PM  Visual Inspection No deformities, no ulcerations, no other skin breakdown bilaterally: Yes Sensation Testing Intact to touch and monofilament testing bilaterally: Yes Pulse Check Posterior Tibialis and Dorsalis pulse intact bilaterally: Yes Comments       Assessment & Plan:   Victor Tapia was seen today for follow-up.  Diagnoses and all orders for this visit:  Type 2 diabetes mellitus with stage 3b chronic kidney disease, without long-term current use of insulin ([place] [place])  Mixed hyperlipidemia  Essential hypertension, benign   I am having Victor Journey. maintain  his aspirin, Multiple Vitamins-Minerals (MENS 50+ MULTI VITAMIN/MIN PO), glimepiride, atorvastatin, amLODipine, metoprolol succinate, tamsulosin, and linagliptin.  No orders of the defined types were placed in this encounter.    Follow-up: Return in about 3 months (around 09/11/2019).  Claretta Fraise, M.D.

## 2019-06-14 ENCOUNTER — Encounter: Payer: Self-pay | Admitting: Family Medicine

## 2019-07-05 ENCOUNTER — Other Ambulatory Visit: Payer: Self-pay | Admitting: Family Medicine

## 2019-07-05 DIAGNOSIS — I10 Essential (primary) hypertension: Secondary | ICD-10-CM

## 2019-09-09 ENCOUNTER — Telehealth: Payer: Medicare Other | Admitting: *Deleted

## 2019-09-09 ENCOUNTER — Telehealth: Payer: Self-pay | Admitting: *Deleted

## 2019-09-09 DIAGNOSIS — I10 Essential (primary) hypertension: Secondary | ICD-10-CM

## 2019-09-09 DIAGNOSIS — E1122 Type 2 diabetes mellitus with diabetic chronic kidney disease: Secondary | ICD-10-CM

## 2019-09-09 DIAGNOSIS — E782 Mixed hyperlipidemia: Secondary | ICD-10-CM

## 2019-09-09 NOTE — Telephone Encounter (Signed)
  Chronic Care Management   Outreach Note  09/09/2019 Name: Victor Tapia. MRN: [id number] DOB: 12/11/1926  Referred by: Claretta Fraise, MD Reason for referral : Chronic Care Management (Initial Visit)   An unsuccessful Initial Telephone outreach was attempted today. The patient was referred to the case management team for assistance with care management and care coordination.   Clinical Goals: . Over the next 10 days, patient will be contacted by a Care Guide to reschedule their Initial CCM Visit . Over the next 30 days, patient will have an Initial CCM Visit with a member of the embedded CCM team to discuss self-management of their chronic medical conditions  Interventions and Plan . Chart reviewed in preparation for initial visit telephone call . Collaboration with other care team members as needed . Unsuccessful outreach to patient  . A HIPAA compliant phone message was left for the patient providing contact information and requesting a return call.  . Request sent to care guides to reach out and reschedule patient's initial visit   Chong Sicilian, BSN, RN-BC [facility] Direct Dial: [phone]

## 2019-09-10 ENCOUNTER — Telehealth: Payer: Self-pay | Admitting: *Deleted

## 2019-09-10 NOTE — Telephone Encounter (Signed)
Patient is rescheduled and aware

## 2019-09-10 NOTE — Chronic Care Management (AMB) (Signed)
  Chronic Care Management   Note  09/10/2019 Name: Victor Tapia. MRN: [id number] DOB: 02/24/1926  [name]. is a 86 y.o. year old male who is a primary care patient of Stacks, Cletus Gash, MD and is actively engaged with the care management team. I reached out to Radene Journey. by phone today to assist with re-scheduling an initial visit with the RN Case Manager.  Follow up plan: Telephone appointment with care management team member scheduled for: 10/17/2019  [facility]  [place],  [address] Direct Dial: [email or web address] Website: [email or web address]

## 2019-09-30 ENCOUNTER — Ambulatory Visit (INDEPENDENT_AMBULATORY_CARE_PROVIDER_SITE_OTHER): Payer: Medicare Other | Admitting: Family Medicine

## 2019-09-30 ENCOUNTER — Other Ambulatory Visit: Payer: Self-pay

## 2019-09-30 ENCOUNTER — Encounter: Payer: Self-pay | Admitting: Family Medicine

## 2019-09-30 VITALS — BP 139/69 | HR 77 | Temp 97.9°F | Resp 20 | Ht 65.5 in | Wt 205.0 lb

## 2019-09-30 DIAGNOSIS — E1122 Type 2 diabetes mellitus with diabetic chronic kidney disease: Secondary | ICD-10-CM

## 2019-09-30 DIAGNOSIS — E782 Mixed hyperlipidemia: Secondary | ICD-10-CM

## 2019-09-30 DIAGNOSIS — I10 Essential (primary) hypertension: Secondary | ICD-10-CM | POA: Diagnosis not present

## 2019-09-30 DIAGNOSIS — E1121 Type 2 diabetes mellitus with diabetic nephropathy: Secondary | ICD-10-CM

## 2019-09-30 DIAGNOSIS — N1832 Chronic kidney disease, stage 3b: Secondary | ICD-10-CM

## 2019-09-30 LAB — BAYER DCA HB A1C WAIVED: HB A1C (BAYER DCA - WAIVED): 7.2 % — ABNORMAL HIGH (ref <7.0)

## 2019-09-30 NOTE — Progress Notes (Signed)
Subjective:  Patient ID: Victor Tapia.,  male    DOB: [birth year]  Age: 86 y.o.    CC: Medical Management of Chronic Issues   HPI Victor Tapia. presents for  follow-up of hypertension. Patient has no history of headache chest pain or shortness of breath or recent cough. Patient also denies symptoms of TIA such as numbness weakness lateralizing. Patient denies side effects from medication. States taking it regularly.  Patient also  in for follow-up of elevated cholesterol. Doing well without complaints on current medication. Denies side effects  including myalgia and arthralgia and nausea. Also in today for liver function testing. Currently no chest pain, shortness of breath or other cardiovascular related symptoms noted.  Follow-up of diabetes. Patient does not check his blood sugar.  He has cut back on what he calls diet bombs.  He is avoiding cheeseburgers for instance in similar high power high calorie as well as sweets etc. Patient denies symptoms such as excessive hunger or urinary frequency, excessive hunger, nausea No significant hypoglycemic spells noted. Medications reviewed. Pt reports taking them regularly. Pt. denies complication/adverse reaction today.    History Victor Tapia has a past medical history of CAD (coronary artery disease), Diabetes mellitus, Hyperlipidemia, Hypertension, OA (osteoarthritis), and Prostate cancer ([place]) (1999).   He has a past surgical history that includes TRIPLE BY PASS (1999); CATARACT  SURGERY RIGHT EYE (4/10); Knee surgery (Right); and Cataract extraction w/PHACO (Left, 12/09/2016).   His family history includes Cancer in his brother and sister.He reports that he quit smoking about 56 years ago. His smoking use included cigarettes. He has a 10.00 pack-year smoking history. He has never used smokeless tobacco. He reports current alcohol use of about 3.0 standard drinks of alcohol per week. He reports that he does not use drugs.  Current Outpatient  Medications on File Prior to Visit  Medication Sig Dispense Refill   amLODipine (NORVASC) 5 MG tablet TAKE 1 TABLET DAILY 90 tablet 3   aspirin 325 MG tablet Take 325 mg by mouth daily.       atorvastatin (LIPITOR) 20 MG tablet TAKE 1 TABLET DAILY AT 6 P.M. 90 tablet 2   glimepiride (AMARYL) 4 MG tablet Take 1 tablet (4 mg total) by mouth daily with breakfast. 90 tablet 3   linagliptin (TRADJENTA) 5 MG TABS tablet TAKE 1 TABLET DAILY FOR DIABETES 90 tablet 3   metoprolol succinate (TOPROL XL) 25 MG 24 hr tablet Take 1 tablet (25 mg total) by mouth daily. 90 tablet 1   Multiple Vitamins-Minerals (MENS 50+ MULTI VITAMIN/MIN PO) Take 1 tablet by mouth daily.      No current facility-administered medications on file prior to visit.    ROS Review of Systems  Constitutional: Negative.   HENT: Negative.   Eyes: Negative for visual disturbance.  Respiratory: Negative for cough and shortness of breath.   Cardiovascular: Negative for chest pain and leg swelling.  Gastrointestinal: Negative for abdominal pain, diarrhea, nausea and vomiting.  Genitourinary: Negative for difficulty urinating.  Musculoskeletal: Negative for arthralgias and myalgias.  Skin: Negative for rash.  Neurological: Negative for headaches.  Psychiatric/Behavioral: Negative for sleep disturbance.    Objective:  BP 139/69    Pulse 77    Temp 97.9 F (36.6 C) (Temporal)    Resp 20    Ht 5' 5.5" (1.664 m)    Wt 205 lb (93 kg)    SpO2 96%    BMI 33.59 kg/m   BP Readings from Last 3  Encounters:  09/30/19 139/69  06/11/19 131/72  02/27/19 130/62    Wt Readings from Last 3 Encounters:  09/30/19 205 lb (93 kg)  06/11/19 208 lb 3.2 oz (94.4 kg)  02/27/19 216 lb 12.8 oz (98.3 kg)     Physical Exam Vitals reviewed.  Constitutional:      Appearance: He is well-developed.  HENT:     Head: Normocephalic and atraumatic.     Right Ear: External ear normal.     Left Ear: External ear normal.     Mouth/Throat:      Pharynx: No oropharyngeal exudate or posterior oropharyngeal erythema.  Eyes:     Pupils: Pupils are equal, round, and reactive to light.  Cardiovascular:     Rate and Rhythm: Normal rate and regular rhythm.     Heart sounds: No murmur heard.   Pulmonary:     Effort: No respiratory distress.     Breath sounds: Normal breath sounds.  Musculoskeletal:     Cervical back: Normal range of motion and neck supple.  Neurological:     Mental Status: He is alert and oriented to person, place, and time.     Diabetic Foot Exam - Simple   No data filed        Assessment & Plan:   Jkwon was seen today for medical management of chronic issues.  Diagnoses and all orders for this visit:  Essential hypertension, benign -     CBC with Differential/Platelet -     CMP14+EGFR -     Lipid panel  Type 2 diabetes mellitus with stage 3b chronic kidney disease, without long-term current use of insulin (HCC) -     Bayer DCA Hb A1c Waived -     CBC with Differential/Platelet -     CMP14+EGFR -     Lipid panel  Mixed hyperlipidemia -     CBC with Differential/Platelet -     CMP14+EGFR -     Lipid panel   I have discontinued Victor Humble Jr.'s tamsulosin. I am also having him maintain his aspirin, Multiple Vitamins-Minerals (MENS 50+ MULTI VITAMIN/MIN PO), glimepiride, atorvastatin, metoprolol succinate, linagliptin, and amLODipine.  No orders of the defined types were placed in this encounter.    Follow-up: Return in about 4 months (around 01/30/2020).  Victor Tapia, M.D.

## 2019-10-01 LAB — CBC WITH DIFFERENTIAL/PLATELET
Basophils Absolute: 0 10*3/uL (ref 0.0–0.2)
Basos: 0 %
EOS (ABSOLUTE): 0.2 10*3/uL (ref 0.0–0.4)
Eos: 1 %
Hematocrit: 38.2 % (ref 37.5–51.0)
Hemoglobin: 12.8 g/dL — ABNORMAL LOW (ref 13.0–17.7)
Immature Grans (Abs): 0 10*3/uL (ref 0.0–0.1)
Immature Granulocytes: 0 %
Lymphocytes Absolute: 3 10*3/uL (ref 0.7–3.1)
Lymphs: 29 %
MCH: 31.8 pg (ref 26.6–33.0)
MCHC: 33.5 g/dL (ref 31.5–35.7)
MCV: 95 fL (ref 79–97)
Monocytes Absolute: 0.8 10*3/uL (ref 0.1–0.9)
Monocytes: 7 %
Neutrophils Absolute: 6.4 10*3/uL (ref 1.4–7.0)
Neutrophils: 63 %
Platelets: 182 10*3/uL (ref 150–450)
RBC: 4.03 x10E6/uL — ABNORMAL LOW (ref 4.14–5.80)
RDW: 12.9 % (ref 11.6–15.4)
WBC: 10.4 10*3/uL (ref 3.4–10.8)

## 2019-10-01 LAB — CMP14+EGFR
ALT: 14 IU/L (ref 0–44)
AST: 20 IU/L (ref 0–40)
Albumin/Globulin Ratio: 1.3 (ref 1.2–2.2)
Albumin: 3.9 g/dL (ref 3.5–4.6)
Alkaline Phosphatase: 103 IU/L (ref 48–121)
BUN/Creatinine Ratio: 17 (ref 10–24)
BUN: 23 mg/dL (ref 10–36)
Bilirubin Total: 0.3 mg/dL (ref 0.0–1.2)
CO2: 25 mmol/L (ref 20–29)
Calcium: 9.3 mg/dL (ref 8.6–10.2)
Chloride: 105 mmol/L (ref 96–106)
Creatinine, Ser: 1.32 mg/dL — ABNORMAL HIGH (ref 0.76–1.27)
GFR calc Af Amer: 54 mL/min/{1.73_m2} — ABNORMAL LOW (ref >59)
GFR calc non Af Amer: 47 mL/min/{1.73_m2} — ABNORMAL LOW (ref >59)
Globulin, Total: 3 g/dL (ref 1.5–4.5)
Glucose: 130 mg/dL — ABNORMAL HIGH (ref 65–99)
Potassium: 4.6 mmol/L (ref 3.5–5.2)
Sodium: 144 mmol/L (ref 134–144)
Total Protein: 6.9 g/dL (ref 6.0–8.5)

## 2019-10-01 LAB — LIPID PANEL
Chol/HDL Ratio: 2.6 ratio (ref 0.0–5.0)
Cholesterol, Total: 121 mg/dL (ref 100–199)
HDL: 47 mg/dL (ref >39)
LDL Chol Calc (NIH): 53 mg/dL (ref 0–99)
Triglycerides: 120 mg/dL (ref 0–149)
VLDL Cholesterol Cal: 21 mg/dL (ref 5–40)

## 2019-10-17 ENCOUNTER — Telehealth: Payer: Medicare Other | Admitting: *Deleted

## 2019-10-23 ENCOUNTER — Other Ambulatory Visit: Payer: Self-pay | Admitting: Family Medicine

## 2019-10-23 DIAGNOSIS — I10 Essential (primary) hypertension: Secondary | ICD-10-CM

## 2019-11-13 ENCOUNTER — Ambulatory Visit (INDEPENDENT_AMBULATORY_CARE_PROVIDER_SITE_OTHER): Payer: Medicare Other

## 2019-11-13 ENCOUNTER — Other Ambulatory Visit: Payer: Self-pay

## 2019-11-13 DIAGNOSIS — Z23 Encounter for immunization: Secondary | ICD-10-CM | POA: Diagnosis not present

## 2019-11-26 ENCOUNTER — Telehealth: Payer: Self-pay | Admitting: *Deleted

## 2019-11-26 ENCOUNTER — Telehealth: Payer: Medicare Other | Admitting: *Deleted

## 2019-11-26 NOTE — Telephone Encounter (Signed)
  Chronic Care Management   Outreach Note  11/26/2019 Name: Victor Tapia. MRN: [id number] DOB: [birth year]  Referred by: Claretta Fraise, MD Reason for referral : Chronic Care Management (Initial Visit)   Third unsuccessful Initial Telephone Visit was attempted today. The patient was referred to the case management team for assistance with care management and care coordination.The care management team is pleased to engage with this patient at any time in the future should he/she be interested in assistance from the care management team.   Follow Up Plan: Removed from CCM program. Patient can be added back at anytime he or his provider desires.  Chong Sicilian, BSN, RN-BC Embedded Chronic Care Manager [facility] Direct Dial: [phone]

## 2019-12-23 ENCOUNTER — Other Ambulatory Visit: Payer: Self-pay | Admitting: Family Medicine

## 2019-12-23 DIAGNOSIS — N183 Chronic kidney disease, stage 3 unspecified: Secondary | ICD-10-CM

## 2019-12-23 DIAGNOSIS — E1122 Type 2 diabetes mellitus with diabetic chronic kidney disease: Secondary | ICD-10-CM

## 2019-12-23 DIAGNOSIS — E782 Mixed hyperlipidemia: Secondary | ICD-10-CM

## 2020-01-08 ENCOUNTER — Ambulatory Visit (INDEPENDENT_AMBULATORY_CARE_PROVIDER_SITE_OTHER): Payer: Medicare Other

## 2020-01-08 ENCOUNTER — Other Ambulatory Visit: Payer: Self-pay

## 2020-01-08 DIAGNOSIS — Z23 Encounter for immunization: Secondary | ICD-10-CM | POA: Diagnosis not present

## 2020-01-08 NOTE — Progress Notes (Signed)
   [facility]  Name:  Bengie Kaucher.    MRN: [id number] DOB: 31-Mar-1926  01/08/2020  Mr. Yepiz was observed post Covid-19 immunization for 15 minutes without incident. He was provided with Vaccine Information Sheet and instruction to access the V-Safe system.   Mr. Nace was instructed to call 911 with any severe reactions post vaccine: Marland Kitchen Difficulty breathing  . Swelling of face and throat  . A fast heartbeat  . A bad rash all over body  . Dizziness and weakness   Immunizations Administered    No immunizations on file.

## 2020-01-28 ENCOUNTER — Other Ambulatory Visit: Payer: Self-pay | Admitting: Family Medicine

## 2020-01-28 DIAGNOSIS — N183 Chronic kidney disease, stage 3 unspecified: Secondary | ICD-10-CM

## 2020-02-10 ENCOUNTER — Ambulatory Visit: Payer: Medicare Other | Admitting: Family Medicine

## 2020-02-26 ENCOUNTER — Ambulatory Visit: Payer: Medicare Other | Admitting: Family Medicine

## 2020-03-23 ENCOUNTER — Other Ambulatory Visit: Payer: Self-pay | Admitting: Family Medicine

## 2020-03-23 DIAGNOSIS — N183 Chronic kidney disease, stage 3 unspecified: Secondary | ICD-10-CM

## 2020-03-23 DIAGNOSIS — E1122 Type 2 diabetes mellitus with diabetic chronic kidney disease: Secondary | ICD-10-CM

## 2020-03-23 DIAGNOSIS — E782 Mixed hyperlipidemia: Secondary | ICD-10-CM

## 2020-03-26 ENCOUNTER — Ambulatory Visit (INDEPENDENT_AMBULATORY_CARE_PROVIDER_SITE_OTHER): Payer: Medicare Other | Admitting: Family Medicine

## 2020-03-26 ENCOUNTER — Other Ambulatory Visit: Payer: Self-pay

## 2020-03-26 ENCOUNTER — Encounter: Payer: Self-pay | Admitting: Family Medicine

## 2020-03-26 VITALS — BP 134/73 | HR 71 | Temp 97.7°F | Resp 20 | Ht 65.5 in | Wt 204.0 lb

## 2020-03-26 DIAGNOSIS — E1122 Type 2 diabetes mellitus with diabetic chronic kidney disease: Secondary | ICD-10-CM

## 2020-03-26 DIAGNOSIS — E782 Mixed hyperlipidemia: Secondary | ICD-10-CM

## 2020-03-26 DIAGNOSIS — I10 Essential (primary) hypertension: Secondary | ICD-10-CM

## 2020-03-26 DIAGNOSIS — N1832 Chronic kidney disease, stage 3b: Secondary | ICD-10-CM | POA: Diagnosis not present

## 2020-03-26 LAB — BAYER DCA HB A1C WAIVED: HB A1C (BAYER DCA - WAIVED): 6.7 % (ref <7.0)

## 2020-03-27 LAB — CMP14+EGFR
ALT: 10 IU/L (ref 0–44)
AST: 15 IU/L (ref 0–40)
Albumin/Globulin Ratio: 1.3 (ref 1.2–2.2)
Albumin: 4 g/dL (ref 3.5–4.6)
Alkaline Phosphatase: 124 IU/L — ABNORMAL HIGH (ref 44–121)
BUN/Creatinine Ratio: 18 (ref 10–24)
BUN: 23 mg/dL (ref 10–36)
Bilirubin Total: 0.3 mg/dL (ref 0.0–1.2)
CO2: 24 mmol/L (ref 20–29)
Calcium: 9 mg/dL (ref 8.6–10.2)
Chloride: 104 mmol/L (ref 96–106)
Creatinine, Ser: 1.27 mg/dL (ref 0.76–1.27)
GFR calc Af Amer: 57 mL/min/{1.73_m2} — ABNORMAL LOW (ref >59)
GFR calc non Af Amer: 49 mL/min/{1.73_m2} — ABNORMAL LOW (ref >59)
Globulin, Total: 3.1 g/dL (ref 1.5–4.5)
Glucose: 185 mg/dL — ABNORMAL HIGH (ref 65–99)
Potassium: 4.2 mmol/L (ref 3.5–5.2)
Sodium: 144 mmol/L (ref 134–144)
Total Protein: 7.1 g/dL (ref 6.0–8.5)

## 2020-03-27 LAB — CBC WITH DIFFERENTIAL/PLATELET
Basophils Absolute: 0 10*3/uL (ref 0.0–0.2)
Basos: 1 %
EOS (ABSOLUTE): 0.1 10*3/uL (ref 0.0–0.4)
Eos: 1 %
Hematocrit: 37.7 % (ref 37.5–51.0)
Hemoglobin: 12.1 g/dL — ABNORMAL LOW (ref 13.0–17.7)
Immature Grans (Abs): 0 10*3/uL (ref 0.0–0.1)
Immature Granulocytes: 0 %
Lymphocytes Absolute: 1.9 10*3/uL (ref 0.7–3.1)
Lymphs: 23 %
MCH: 30.4 pg (ref 26.6–33.0)
MCHC: 32.1 g/dL (ref 31.5–35.7)
MCV: 95 fL (ref 79–97)
Monocytes Absolute: 0.7 10*3/uL (ref 0.1–0.9)
Monocytes: 8 %
Neutrophils Absolute: 5.4 10*3/uL (ref 1.4–7.0)
Neutrophils: 67 %
Platelets: 178 10*3/uL (ref 150–450)
RBC: 3.98 x10E6/uL — ABNORMAL LOW (ref 4.14–5.80)
RDW: 12.4 % (ref 11.6–15.4)
WBC: 8.2 10*3/uL (ref 3.4–10.8)

## 2020-03-27 LAB — LIPID PANEL
Chol/HDL Ratio: 2.3 ratio (ref 0.0–5.0)
Cholesterol, Total: 125 mg/dL (ref 100–199)
HDL: 54 mg/dL (ref >39)
LDL Chol Calc (NIH): 52 mg/dL (ref 0–99)
Triglycerides: 103 mg/dL (ref 0–149)
VLDL Cholesterol Cal: 19 mg/dL (ref 5–40)

## 2020-03-29 ENCOUNTER — Encounter: Payer: Self-pay | Admitting: Family Medicine

## 2020-03-29 NOTE — Progress Notes (Signed)
Subjective:  Patient ID: Victor Tapia., male    DOB: [birth year]  Age: 87 y.o. MRN: [id number]  CC: Medical Management of Chronic Issues   HPI Victor Tapia. presents forFollow-up of diabetes. Patient does not check blood sugar at home.  Patient denies symptoms such as polyuria, polydipsia, excessive hunger, nausea No significant hypoglycemic spells noted. Medications reviewed. Pt reports taking them regularly without complication/adverse reaction being reported today.  Checking feet daily.   History Victor Tapia has a past medical history of CAD (coronary artery disease), Diabetes mellitus, Hyperlipidemia, Hypertension, OA (osteoarthritis), and Prostate cancer ([place]) (1999).   Victor Tapia has a past surgical history that includes TRIPLE BY PASS (1999); CATARACT  SURGERY RIGHT EYE (4/10); Knee surgery (Right); and Cataract extraction w/PHACO (Left, 12/09/2016).   His family history includes Cancer in his brother and sister.Victor Tapia reports that Victor Tapia quit smoking about 57 years ago. His smoking use included cigarettes. Victor Tapia has a 10.00 pack-year smoking history. Victor Tapia has never used smokeless tobacco. Victor Tapia reports current alcohol use of about 3.0 standard drinks of alcohol per week. Victor Tapia reports that Victor Tapia does not use drugs.  Current Outpatient Medications on File Prior to Visit  Medication Sig Dispense Refill  . amLODipine (NORVASC) 5 MG tablet TAKE 1 TABLET DAILY 90 tablet 3  . aspirin 325 MG tablet Take 325 mg by mouth daily.    Marland Kitchen atorvastatin (LIPITOR) 20 MG tablet TAKE 1 TABLET DAILY AT 6 P.M. 90 tablet 0  . glimepiride (AMARYL) 4 MG tablet Take 1 tablet (4 mg total) by mouth daily with breakfast. 90 tablet 3  . metoprolol succinate (TOPROL-XL) 25 MG 24 hr tablet TAKE 1 TABLET DAILY 90 tablet 3  . Multiple Vitamins-Minerals (MENS 50+ MULTI VITAMIN/MIN PO) Take 1 tablet by mouth daily.     . TRADJENTA 5 MG TABS tablet TAKE 1 TABLET DAILY FOR DIABETES 90 tablet 3   No current facility-administered medications on  file prior to visit.    ROS Review of Systems  Constitutional: Negative for fever.  Respiratory: Negative for shortness of breath.   Cardiovascular: Negative for chest pain.  Musculoskeletal: Negative for arthralgias.  Skin: Negative for rash.    Objective:  BP 134/73   Pulse 71   Temp 97.7 F (36.5 C) (Temporal)   Resp 20   Ht 5' 5.5" (1.664 m)   Wt 204 lb (92.5 kg)   SpO2 98%   BMI 33.43 kg/m   BP Readings from Last 3 Encounters:  03/26/20 134/73  09/30/19 139/69  06/11/19 131/72    Wt Readings from Last 3 Encounters:  03/26/20 204 lb (92.5 kg)  09/30/19 205 lb (93 kg)  06/11/19 208 lb 3.2 oz (94.4 kg)     Physical Exam Constitutional:      General: Victor Tapia is not in acute distress.    Appearance: Victor Tapia is well-developed.  HENT:     Head: Normocephalic and atraumatic.     Right Ear: External ear normal.     Left Ear: External ear normal.     Nose: Nose normal.  Eyes:     Conjunctiva/sclera: Conjunctivae normal.     Pupils: Pupils are equal, round, and reactive to light.  Cardiovascular:     Rate and Rhythm: Normal rate and regular rhythm.     Heart sounds: Normal heart sounds. No murmur heard.   Pulmonary:     Effort: Pulmonary effort is normal. No respiratory distress.     Breath sounds: Normal breath sounds. No wheezing or  rales.  Abdominal:     Palpations: Abdomen is soft.     Tenderness: There is no abdominal tenderness.  Musculoskeletal:        General: Normal range of motion.     Cervical back: Normal range of motion and neck supple.  Skin:    General: Skin is warm and dry.  Neurological:     Mental Status: Victor Tapia is alert and oriented to person, place, and time.     Deep Tendon Reflexes: Reflexes are normal and symmetric.  Psychiatric:        Behavior: Behavior normal.        Thought Content: Thought content normal.        Judgment: Judgment normal.       Assessment & Plan:   [name] was seen today for medical management of chronic  issues.  Diagnoses and all orders for this visit:  Essential hypertension, benign -     Bayer DCA Hb A1c Waived -     CBC with Differential/Platelet -     CMP14+EGFR -     Lipid panel  Type 2 diabetes mellitus with stage 3b chronic kidney disease, without long-term current use of insulin (HCC) -     Bayer DCA Hb A1c Waived -     CBC with Differential/Platelet -     CMP14+EGFR -     Lipid panel  Mixed hyperlipidemia -     Bayer DCA Hb A1c Waived -     CBC with Differential/Platelet -     CMP14+EGFR -     Lipid panel      I am having Victor Tapia. maintain his aspirin, Multiple Vitamins-Minerals (MENS 50+ MULTI VITAMIN/MIN PO), glimepiride, amLODipine, metoprolol succinate, Tradjenta, and atorvastatin.  No orders of the defined types were placed in this encounter.    Follow-up: No follow-ups on file.  Claretta Fraise, M.D.

## 2020-04-27 ENCOUNTER — Other Ambulatory Visit: Payer: Self-pay | Admitting: Family Medicine

## 2020-04-27 DIAGNOSIS — N183 Chronic kidney disease, stage 3 unspecified: Secondary | ICD-10-CM

## 2020-05-15 ENCOUNTER — Other Ambulatory Visit: Payer: Self-pay | Admitting: Family Medicine

## 2020-05-15 DIAGNOSIS — N401 Enlarged prostate with lower urinary tract symptoms: Secondary | ICD-10-CM

## 2020-05-15 DIAGNOSIS — R350 Frequency of micturition: Secondary | ICD-10-CM

## 2020-05-15 NOTE — Telephone Encounter (Signed)
Stacks patient  Not on current med list Last office 03/26/20

## 2020-06-16 ENCOUNTER — Other Ambulatory Visit: Payer: Self-pay | Admitting: Family Medicine

## 2020-06-16 DIAGNOSIS — I10 Essential (primary) hypertension: Secondary | ICD-10-CM

## 2020-06-17 DIAGNOSIS — Z23 Encounter for immunization: Secondary | ICD-10-CM | POA: Diagnosis not present

## 2020-06-22 ENCOUNTER — Other Ambulatory Visit: Payer: Self-pay | Admitting: Family Medicine

## 2020-06-22 DIAGNOSIS — E1122 Type 2 diabetes mellitus with diabetic chronic kidney disease: Secondary | ICD-10-CM

## 2020-06-22 DIAGNOSIS — N183 Chronic kidney disease, stage 3 unspecified: Secondary | ICD-10-CM

## 2020-06-22 DIAGNOSIS — E782 Mixed hyperlipidemia: Secondary | ICD-10-CM

## 2020-09-11 DIAGNOSIS — K0889 Other specified disorders of teeth and supporting structures: Secondary | ICD-10-CM | POA: Diagnosis not present

## 2020-09-14 ENCOUNTER — Other Ambulatory Visit: Payer: Self-pay | Admitting: Family Medicine

## 2020-09-14 DIAGNOSIS — I10 Essential (primary) hypertension: Secondary | ICD-10-CM

## 2020-09-20 ENCOUNTER — Other Ambulatory Visit: Payer: Self-pay | Admitting: Family Medicine

## 2020-09-20 DIAGNOSIS — N183 Chronic kidney disease, stage 3 unspecified: Secondary | ICD-10-CM

## 2020-09-20 DIAGNOSIS — E782 Mixed hyperlipidemia: Secondary | ICD-10-CM

## 2020-09-23 ENCOUNTER — Ambulatory Visit: Payer: Medicare Other | Admitting: Family Medicine

## 2020-10-02 ENCOUNTER — Encounter: Payer: Self-pay | Admitting: Family

## 2020-10-02 ENCOUNTER — Ambulatory Visit (INDEPENDENT_AMBULATORY_CARE_PROVIDER_SITE_OTHER): Payer: Medicare Other | Admitting: Family

## 2020-10-02 DIAGNOSIS — R059 Cough, unspecified: Secondary | ICD-10-CM | POA: Diagnosis not present

## 2020-10-02 DIAGNOSIS — Z20822 Contact with and (suspected) exposure to covid-19: Secondary | ICD-10-CM

## 2020-10-02 MED ORDER — MOLNUPIRAVIR EUA 200MG CAPSULE: 4.0000 | ORAL_CAPSULE | Freq: Two times a day (BID) | ORAL | 0 refills | Status: AC

## 2020-10-02 NOTE — Progress Notes (Signed)
Virtual Visit  Note Due to COVID-19 pandemic this visit was conducted virtually. This visit type was conducted due to national recommendations for restrictions regarding the COVID-19 Pandemic (e.g. social distancing, sheltering in place) in an effort to limit this patient's exposure and mitigate transmission in our community. All issues noted in this document were discussed and addressed.  A physical exam was not performed with this format.  I connected with Victor Tapia. on 10/02/20 at 12:02 pm  by telephone and verified that I am speaking with the correct person using two identifiers. Victor Tapia. is currently located at home and his wife is currently with him  during visit. The provider, Evelina Dun, FNP is located in their office at time of visit.  I discussed the limitations, risks, security and privacy concerns of performing an evaluation and management service by telephone and the availability of in person appointments. I also discussed with the patient that there may be a patient responsible charge related to this service. The patient expressed understanding and agreed to proceed.  Victor Tapia, Victor Tapia are scheduled for a virtual visit with your provider today.    Just as we do with appointments in the office, we must obtain your consent to participate.  Your consent will be active for this visit and any virtual visit you may have with one of our providers in the next 365 days.    If you have a MyChart account, I can also send a copy of this consent to you electronically.  All virtual visits are billed to your insurance company just like a traditional visit in the office.  As this is a virtual visit, video technology does not allow for your provider to perform a traditional examination.  This may limit your provider's ability to fully assess your condition.  If your provider identifies any concerns that need to be evaluated in person or the need to arrange testing such as labs, EKG, etc, we  will make arrangements to do so.    Although advances in technology are sophisticated, we cannot ensure that it will always work on either your end or our end.  If the connection with a video visit is poor, we may have to switch to a telephone visit.  With either a video or telephone visit, we are not always able to ensure that we have a secure connection.   I need to obtain your verbal consent now.   Are you willing to proceed with your visit today?   Victor Tapia. has provided verbal consent on 10/02/2020 for a virtual visit (video or telephone).   Evelina Dun, [place] 10/02/2020  12:07 PM   History and Present Illness:  Pt calls the office today with possible COVID. States yesterday he had had dry cough, congestion, and fever.  Cough This is a new problem. The current episode started in the past 7 days. The problem has been waxing and waning. The problem occurs every few minutes. Associated symptoms include chills, a fever, headaches, nasal congestion, postnasal drip, a sore throat and wheezing. Pertinent negatives include no ear congestion, ear pain, myalgias or shortness of breath. The symptoms are aggravated by lying down. He has tried rest for the symptoms. The treatment provided moderate relief.     Review of Systems  Constitutional:  Positive for chills and fever.  HENT:  Positive for postnasal drip and sore throat. Negative for ear pain.   Respiratory:  Positive for cough and wheezing. Negative for shortness of breath.  Musculoskeletal:  Negative for myalgias.  Neurological:  Positive for headaches.  All other systems reviewed and are negative.   Observations/Objective: No SOB or distress noted, hoarse voice  Assessment and Plan: 1. Cough - Novel Coronavirus, NAA ([facility])  2. Encounter by telehealth for suspected COVID-19   Pt will go get home COVID test to rule given it is weekend  Force fluids If positive will send anitvirals      I discussed the assessment and  treatment plan with the patient. The patient was provided an opportunity to ask questions and all were answered. The patient agreed with the plan and demonstrated an understanding of the instructions.   The patient was advised to call back or seek an in-person evaluation if the symptoms worsen or if the condition fails to improve as anticipated.  The above assessment and management plan was discussed with the patient. The patient verbalized understanding of and has agreed to the management plan. Patient is aware to call the clinic if symptoms persist or worsen. Patient is aware when to return to the clinic for a follow-up visit. Patient educated on when it is appropriate to go to the emergency department.   Time call ended:  12:13 pm   I provided 11 minutes of  non face-to-face time during this encounter.    Evelina Dun, FNP

## 2020-10-02 NOTE — Addendum Note (Signed)
Addended by: Evelina Dun A on: 10/02/2020 02:23 PM   Modules accepted: Orders

## 2020-10-03 LAB — SARS-COV-2, NAA 2 DAY TAT

## 2020-10-03 LAB — NOVEL CORONAVIRUS, NAA: SARS-CoV-2, NAA: DETECTED — AB

## 2020-10-07 ENCOUNTER — Ambulatory Visit: Payer: Medicare Other | Admitting: Family Medicine

## 2020-10-19 ENCOUNTER — Other Ambulatory Visit: Payer: Self-pay | Admitting: Family Medicine

## 2020-10-19 DIAGNOSIS — I10 Essential (primary) hypertension: Secondary | ICD-10-CM

## 2020-10-21 ENCOUNTER — Telehealth: Payer: Self-pay | Admitting: Family Medicine

## 2020-10-21 NOTE — Telephone Encounter (Signed)
Left message for patient to call back and schedule Medicare Annual Wellness Visit (AWV) either virtually or in office. I gave both office number and my number [phone]   Last AWVI 04/20/17 ; please schedule at anytime with health coach  This should be a 45 minute visit.

## 2020-10-22 ENCOUNTER — Other Ambulatory Visit: Payer: Self-pay

## 2020-10-22 ENCOUNTER — Ambulatory Visit (INDEPENDENT_AMBULATORY_CARE_PROVIDER_SITE_OTHER): Payer: Medicare Other

## 2020-10-22 DIAGNOSIS — Z23 Encounter for immunization: Secondary | ICD-10-CM | POA: Diagnosis not present

## 2020-10-26 ENCOUNTER — Other Ambulatory Visit: Payer: Self-pay | Admitting: Family Medicine

## 2020-10-26 DIAGNOSIS — E1122 Type 2 diabetes mellitus with diabetic chronic kidney disease: Secondary | ICD-10-CM

## 2020-10-26 DIAGNOSIS — N183 Chronic kidney disease, stage 3 unspecified: Secondary | ICD-10-CM

## 2020-10-28 ENCOUNTER — Ambulatory Visit (INDEPENDENT_AMBULATORY_CARE_PROVIDER_SITE_OTHER): Payer: Medicare Other

## 2020-10-28 VITALS — Ht 66.0 in | Wt 205.0 lb

## 2020-10-28 DIAGNOSIS — Z0000 Encounter for general adult medical examination without abnormal findings: Secondary | ICD-10-CM

## 2020-10-28 NOTE — Patient Instructions (Signed)
Victor Tapia , Thank you for taking time to come for your Medicare Wellness Visit. I appreciate your ongoing commitment to your health goals. Please review the following plan we discussed and let me know if I can assist you in the future.   Screening recommendations/referrals: Colonoscopy: No longer required Recommended yearly ophthalmology/optometry visit for glaucoma screening and checkup Recommended yearly dental visit for hygiene and checkup  Vaccinations: Influenza vaccine: Done 10/22/2020 - Repeat annually Pneumococcal vaccine: Done 11/15/2006 & 11/12/2015 Tdap vaccine: Due. Every 10 years Shingles vaccine: Due. Shingrix discussed. Please contact your pharmacy for coverage information.     Covid-19: Done 03/21/19, 04/19/19, 01/08/2020, & 06/11/2020  Advanced directives: Please bring a copy of your health care power of attorney and living will to the office to be added to your chart at your convenience.   Conditions/risks identified: Aim for 30 minutes of exercise or brisk walking each day, drink 6-8 glasses of water and eat lots of fruits and vegetables.   Next appointment: Follow up in one year for your annual wellness visit.   Preventive Care 85 Years and Older, Male  Preventive care refers to lifestyle choices and visits with your health care provider that can promote health and wellness. What does preventive care include? A yearly physical exam. This is also called an annual well check. Dental exams once or twice a year. Routine eye exams. Ask your health care provider how often you should have your eyes checked. Personal lifestyle choices, including: Daily care of your teeth and gums. Regular physical activity. Eating a healthy diet. Avoiding tobacco and drug use. Limiting alcohol use. Practicing safe sex. Taking low doses of aspirin every day. Taking vitamin and mineral supplements as recommended by your health care provider. What happens during an annual well check? The  services and screenings done by your health care provider during your annual well check will depend on your age, overall health, lifestyle risk factors, and family history of disease. Counseling  Your health care provider may ask you questions about your: Alcohol use. Tobacco use. Drug use. Emotional well-being. Home and relationship well-being. Sexual activity. Eating habits. History of falls. Memory and ability to understand (cognition). Work and work Statistician. Screening  You may have the following tests or measurements: Height, weight, and BMI. Blood pressure. Lipid and cholesterol levels. These may be checked every 5 years, or more frequently if you are over 85 years old. Skin check. Lung cancer screening. You may have this screening every year starting at age 85 if you have a 30-pack-year history of smoking and currently smoke or have quit within the past 15 years. Fecal occult blood test (FOBT) of the stool. You may have this test every year starting at age 85. Flexible sigmoidoscopy or colonoscopy. You may have a sigmoidoscopy every 5 years or a colonoscopy every 10 years starting at age 85. Prostate cancer screening. Recommendations will vary depending on your family history and other risks. Hepatitis C blood test. Hepatitis B blood test. Sexually transmitted disease (STD) testing. Diabetes screening. This is done by checking your blood sugar (glucose) after you have not eaten for a while (fasting). You may have this done every 1-3 years. Abdominal aortic aneurysm (AAA) screening. You may need this if you are a current or former smoker. Osteoporosis. You may be screened starting at age 85 if you are at high risk. Talk with your health care provider about your test results, treatment options, and if necessary, the need for more tests. Vaccines  Your health care provider may recommend certain vaccines, such as: Influenza vaccine. This is recommended every year. Tetanus,  diphtheria, and acellular pertussis (Tdap, Td) vaccine. You may need a Td booster every 10 years. Zoster vaccine. You may need this after age 85. Pneumococcal 13-valent conjugate (PCV13) vaccine. One dose is recommended after age 12. Pneumococcal polysaccharide (PPSV23) vaccine. One dose is recommended after age 85. Talk to your health care provider about which screenings and vaccines you need and how often you need them. This information is not intended to replace advice given to you by your health care provider. Make sure you discuss any questions you have with your health care provider. Document Released: 02/20/2015 Document Revised: 10/14/2015 Document Reviewed: 11/25/2014 Elsevier Interactive Patient Education  2017 [place] Prevention in the Home Falls can cause injuries. They can happen to people of all ages. There are many things you can do to make your home safe and to help prevent falls. What can I do on the outside of my home? Regularly fix the edges of walkways and driveways and fix any cracks. Remove anything that might make you trip as you walk through a door, such as a raised step or threshold. Trim any bushes or trees on the path to your home. Use bright outdoor lighting. Clear any walking paths of anything that might make someone trip, such as rocks or tools. Regularly check to see if handrails are loose or broken. Make sure that both sides of any steps have handrails. Any raised decks and porches should have guardrails on the edges. Have any leaves, snow, or ice cleared regularly. Use sand or salt on walking paths during winter. Clean up any spills in your garage right away. This includes oil or grease spills. What can I do in the bathroom? Use night lights. Install grab bars by the toilet and in the tub and shower. Do not use towel bars as grab bars. Use non-skid mats or decals in the tub or shower. If you need to sit down in the shower, use a plastic,  non-slip stool. Keep the floor dry. Clean up any water that spills on the floor as soon as it happens. Remove soap buildup in the tub or shower regularly. Attach bath mats securely with double-sided non-slip rug tape. Do not have throw rugs and other things on the floor that can make you trip. What can I do in the bedroom? Use night lights. Make sure that you have a light by your bed that is easy to reach. Do not use any sheets or blankets that are too big for your bed. They should not hang down onto the floor. Have a firm chair that has side arms. You can use this for support while you get dressed. Do not have throw rugs and other things on the floor that can make you trip. What can I do in the kitchen? Clean up any spills right away. Avoid walking on wet floors. Keep items that you use a lot in easy-to-reach places. If you need to reach something above you, use a strong step stool that has a grab bar. Keep electrical cords out of the way. Do not use floor polish or wax that makes floors slippery. If you must use wax, use non-skid floor wax. Do not have throw rugs and other things on the floor that can make you trip. What can I do with my stairs? Do not leave any items on the stairs. Make sure that there are  handrails on both sides of the stairs and use them. Fix handrails that are broken or loose. Make sure that handrails are as long as the stairways. Check any carpeting to make sure that it is firmly attached to the stairs. Fix any carpet that is loose or worn. Avoid having throw rugs at the top or bottom of the stairs. If you do have throw rugs, attach them to the floor with carpet tape. Make sure that you have a light switch at the top of the stairs and the bottom of the stairs. If you do not have them, ask someone to add them for you. What else can I do to help prevent falls? Wear shoes that: Do not have high heels. Have rubber bottoms. Are comfortable and fit you well. Are closed  at the toe. Do not wear sandals. If you use a stepladder: Make sure that it is fully opened. Do not climb a closed stepladder. Make sure that both sides of the stepladder are locked into place. Ask someone to hold it for you, if possible. Clearly mark and make sure that you can see: Any grab bars or handrails. First and last steps. Where the edge of each step is. Use tools that help you move around (mobility aids) if they are needed. These include: Canes. Walkers. Scooters. Crutches. Turn on the lights when you go into a dark area. Replace any light bulbs as soon as they burn out. Set up your furniture so you have a clear path. Avoid moving your furniture around. If any of your floors are uneven, fix them. If there are any pets around you, be aware of where they are. Review your medicines with your doctor. Some medicines can make you feel dizzy. This can increase your chance of falling. Ask your doctor what other things that you can do to help prevent falls. This information is not intended to replace advice given to you by your health care provider. Make sure you discuss any questions you have with your health care provider. Document Released: 11/20/2008 Document Revised: 07/02/2015 Document Reviewed: 02/28/2014 Elsevier Interactive Patient Education  2017 [employer].

## 2020-10-28 NOTE — Progress Notes (Signed)
Subjective:   Victor Gopaul. is a 87 y.o. male who presents for Medicare Annual/Subsequent preventive examination.  Virtual Visit via Telephone Note  I connected with  Victor Tapia. on 10/28/20 at  8:15 AM EDT by telephone and verified that I am speaking with the correct person using two identifiers.  Location: Patient: Home Provider: [facility] Persons participating in the virtual visit: patient/Nurse Health Advisor   I discussed the limitations, risks, security and privacy concerns of performing an evaluation and management service by telephone and the availability of in person appointments. The patient expressed understanding and agreed to proceed.  Interactive audio and video telecommunications were attempted between this nurse and patient, however failed, due to patient having technical difficulties OR patient did not have access to video capability.  We continued and completed visit with audio only.  Some vital signs may be absent or patient reported.   Victor Swinson E Tvisha Schwoerer, LPN   Review of Systems     Cardiac Risk Factors include: advanced age (>69men, >60 women);sedentary lifestyle;obesity (BMI >30kg/m2);hypertension;dyslipidemia;diabetes mellitus;male gender     Objective:    Today's Vitals   10/28/20 0820  Weight: 205 lb (93 kg)  Height: 5\' 6"  (1.676 m)   Body mass index is 33.09 kg/m.  Advanced Directives 10/28/2020 04/20/2017 12/09/2016 12/07/2016  Does Patient Have a Medical Advance Directive? Yes Yes No No  Type of Paramedic of [place];Living will Living will;Healthcare Power of Attorney - -  Does patient want to make changes to medical advance directive? - No - Patient declined - -  Copy of [place] in Chart? No - copy requested No - copy requested - -  Would patient like information on creating a medical advance directive? - - No - Patient declined No - Patient declined    Current Medications (verified) Outpatient  Encounter Medications as of 10/28/2020  Medication Sig   tamsulosin (FLOMAX) 0.4 MG CAPS capsule TAKE 2 CAPSULES AT BEDTIME FOR URINE FLOW AND PROSTATE   amLODipine (NORVASC) 5 MG tablet TAKE 1 TABLET DAILY   aspirin 325 MG tablet Take 325 mg by mouth daily.   atorvastatin (LIPITOR) 20 MG tablet TAKE 1 TABLET DAILY AT 6 P.M.   glimepiride (AMARYL) 4 MG tablet TAKE 1 TABLET DAILY WITH BREAKFAST   ibuprofen (ADVIL) 800 MG tablet Take 800 mg by mouth every 8 (eight) hours as needed.   metoprolol succinate (TOPROL-XL) 25 MG 24 hr tablet TAKE 1 TABLET DAILY   Multiple Vitamins-Minerals (MENS 50+ MULTI VITAMIN/MIN PO) Take 1 tablet by mouth daily.    TRADJENTA 5 MG TABS tablet TAKE 1 TABLET DAILY FOR DIABETES   No facility-administered encounter medications on file as of 10/28/2020.    Allergies (verified) Patient has no known allergies.   History: Past Medical History:  Diagnosis Date   CAD (coronary artery disease)    Diabetes mellitus    Hyperlipidemia    Hypertension    OA (osteoarthritis)    MULTIPLE SITES    Prostate cancer ([place]) 1999   HORMONE TREATMENT    Past Surgical History:  Procedure Laterality Date   CATARACT  SURGERY RIGHT EYE  4/10   CATARACT EXTRACTION W/PHACO Left 12/09/2016   Procedure: CATARACT EXTRACTION PHACO AND INTRAOCULAR LENS PLACEMENT LEFT EYE CDE=12.57;  Surgeon: Baruch Goldmann, MD;  Location: [facility];  Service: Ophthalmology;  Laterality: Left;  left   KNEE SURGERY Right    [place]  Family History  Problem Relation Age of Onset   Cancer Sister        lymphoma   Cancer Brother        lunch   Social History   Socioeconomic History   Marital status: Married    Spouse name: Not on file   Number of children: 3   Years of education: 16   Highest education level: Bachelor's degree (e.g., BA, AB, BS)  Occupational History   Occupation: Customer service manager    Comment: Retired   Occupation: Social research officer, government    Comment: Retired  Tobacco Use    Smoking status: Former    Packs/day: 1.00    Years: 10.00    Pack years: 10.00    Types: Cigarettes    Quit date: 12/08/1962    Years since quitting: 57.9   Smokeless tobacco: Never  Vaping Use   Vaping Use: Never used  Substance and Sexual Activity   Alcohol use: Yes    Alcohol/week: 3.0 standard drinks    Types: 3 Cans of beer per week   Drug use: No   Sexual activity: Yes    Birth control/protection: None  Other Topics Concern   Not on file  Social History Narrative   Lives in 2 story home with handicap accessible bathroom with his wife who has Lewy Body Dementia   Social Determinants of Health   Financial Resource Strain: Low Risk    Difficulty of Paying Living Expenses: Not hard at all  Food Insecurity: No Food Insecurity   Worried About Charity fundraiser in the Last Year: Never true   Ran Out of Food in the Last Year: Never true  Transportation Needs: No Transportation Needs   Lack of Transportation (Medical): No   Lack of Transportation (Non-Medical): No  Physical Activity: Insufficiently Active   Days of Exercise per Week: 7 days   Minutes of Exercise per Session: 20 min  Stress: No Stress Concern Present   Feeling of Stress : Not at all  Social Connections: Socially Integrated   Frequency of Communication with Friends and Family: More than three times a week   Frequency of Social Gatherings with Friends and Family: More than three times a week   Attends Religious Services: More than 4 times per year   Active Member of [employer] or Organizations: Yes   Attends Music therapist: More than 4 times per year   Marital Status: Married    Tobacco Counseling Counseling given: Not Answered   Clinical Intake:  Pre-visit preparation completed: Yes  Pain : No/denies pain     BMI - recorded: 33.09 Nutritional Status: BMI > 30  Obese Nutritional Risks: None Diabetes: Yes CBG done?: No Did pt. bring in CBG monitor from home?: No  How often do you  need to have someone help you when you read instructions, pamphlets, or other written materials from your doctor or pharmacy?: 1 - Never  Diabetic? yes  Interpreter Needed?: No  Information entered by :: Eduin Friedel, LPN   Activities of Daily Living In your present state of health, do you have any difficulty performing the following activities: 10/28/2020  Hearing? N  Vision? N  Difficulty concentrating or making decisions? N  Walking or climbing stairs? N  Dressing or bathing? N  Doing errands, shopping? N  Preparing Food and eating ? N  Using the Toilet? N  In the past six months, have you accidently leaked urine? Y  Comment rare  Do you have  problems with loss of bowel control? N  Managing your Medications? N  Managing your Finances? N  Housekeeping or managing your Housekeeping? N  Some recent data might be hidden    Patient Care Team: Claretta Fraise, MD as PCP - General (Family Medicine)  Indicate any recent Medical Services you may have received from other than [facility] providers in the past year (date may be approximate).     Assessment:   This is a routine wellness examination for [place].  Hearing/Vision screen Hearing Screening - Comments:: Denies hearing difficulties  Vision Screening - Comments:: Wears glasses for reading only - up to date with annual eye exams at [place] issues and exercise activities discussed: Current Exercise Habits: Home exercise routine, Type of exercise: treadmill;walking, Time (Minutes): 20, Frequency (Times/Week): 7, Weekly Exercise (Minutes/Week): 140, Intensity: Mild, Exercise limited by: None identified   Goals Addressed             This Visit's Progress    Exercise 150 min/wk Moderate Activity   On track      Depression Screen PHQ 2/9 Scores 10/28/2020 03/26/2020 09/30/2019 06/11/2019 02/27/2019 02/16/2018 11/15/2017  PHQ - 2 Score 0 0 0 0 0 0 0    Fall Risk Fall Risk  10/28/2020 03/26/2020 09/30/2019 06/11/2019  02/27/2019  Falls in the past year? 0 1 0 0 0  Number falls in past yr: 0 0 - 0 -  Injury with Fall? 0 0 - 0 -  Risk for fall due to : No Fall Risks History of fall(s) - No Fall Risks -  Follow up Falls prevention discussed Falls evaluation completed Falls evaluation completed Falls evaluation completed -    FALL RISK PREVENTION PERTAINING TO THE HOME:  Any stairs in or around the home? Yes  If so, are there any without handrails? No  Home free of loose throw rugs in walkways, pet beds, electrical cords, etc? Yes  Adequate lighting in your home to reduce risk of falls? Yes   ASSISTIVE DEVICES UTILIZED TO PREVENT FALLS:  Life alert? No  Use of a cane, walker or w/c? No  Grab bars in the bathroom? Yes  Shower chair or bench in shower? Yes  Elevated toilet seat or a handicapped toilet? Yes   TIMED UP AND GO:  Was the test performed? No . Telephonic visit  Cognitive Function: Normal cognitive status assessed by direct observation by this Nurse Health Advisor. No abnormalities found.   MMSE - Mini Mental State Exam 04/20/2017  Orientation to time 5  Orientation to Place 5  Registration 3  Attention/ Calculation 4  Recall 3  Language- name 2 objects 2  Language- repeat 1  Language- follow 3 step command 3  Language- read & follow direction 1  Write a sentence 1  Copy design 1  Total score 29        Immunizations Immunization History  Administered Date(s) Administered   Fluad Quad(high Dose 65+) 10/18/2018, 11/13/2019, 10/22/2020   Influenza Split 11/15/2006, 11/02/2007, 11/20/2019   Influenza, High Dose Seasonal PF 11/24/2014, 11/11/2016, 11/15/2017   Influenza-Unspecified 11/15/2006, 11/02/2007, 12/08/2012, 11/07/2013   Moderna SARS-COV2 Booster Vaccination 01/08/2020   Moderna Sars-Covid-2 Vaccination 03/21/2019, 04/19/2019   Pneumococcal Polysaccharide-23 11/15/2006, 11/12/2015    TDAP status: Due, Education has been provided regarding the importance of this  vaccine. Advised may receive this vaccine at local pharmacy or Health Dept. Aware to provide a copy of the vaccination record if obtained from local pharmacy or Health Dept. Verbalized  acceptance and understanding.  Flu Vaccine status: Up to date  Pneumococcal vaccine status: Up to date  Covid-19 vaccine status: Completed vaccines  Qualifies for Shingles Vaccine? Yes   Zostavax completed Yes   Shingrix Completed?: No.    Education has been provided regarding the importance of this vaccine. Patient has been advised to call insurance company to determine out of pocket expense if they have not yet received this vaccine. Advised may also receive vaccine at local pharmacy or Health Dept. Verbalized acceptance and understanding.  Screening Tests Health Maintenance  Topic Date Due   TETANUS/TDAP  Never done   Zoster Vaccines- Shingrix (1 of 2) Never done   OPHTHALMOLOGY EXAM  11/01/2017   COVID-19 Vaccine (4 - Booster for Moderna series) 04/01/2020   URINE MICROALBUMIN  06/09/2020   FOOT EXAM  06/10/2020   HEMOGLOBIN A1C  09/23/2020   INFLUENZA VACCINE  Completed   HPV VACCINES  Aged Out    Health Maintenance  Health Maintenance Due  Topic Date Due   TETANUS/TDAP  Never done   Zoster Vaccines- Shingrix (1 of 2) Never done   OPHTHALMOLOGY EXAM  11/01/2017   COVID-19 Vaccine (4 - Booster for Moderna series) 04/01/2020   URINE MICROALBUMIN  06/09/2020   FOOT EXAM  06/10/2020   HEMOGLOBIN A1C  09/23/2020    Colorectal cancer screening: No longer required.   Lung Cancer Screening: (Low Dose CT Chest recommended if Age 42-80 years, 30 pack-year currently smoking OR have quit w/in 15years.) does not qualify.   Additional Screening:  Hepatitis C Screening: does not qualify  Vision Screening: Recommended annual ophthalmology exams for early detection of glaucoma and other disorders of the eye. Is the patient up to date with their annual eye exam?  Yes  Who is the provider or what is  the name of the office in which the patient attends annual eye exams? [facility] If pt is not established with a provider, would they like to be referred to a provider to establish care? No .   Dental Screening: Recommended annual dental exams for proper oral hygiene  Community Resource Referral / Chronic Care Management: CRR required this visit?  No   CCM required this visit?  No      Plan:     I have personally reviewed and noted the following in the patient's chart:   Medical and social history Use of alcohol, tobacco or illicit drugs  Current medications and supplements including opioid prescriptions. Patient is not currently taking opioid prescriptions. Functional ability and status Nutritional status Physical activity Advanced directives List of other physicians Hospitalizations, surgeries, and ER visits in previous 12 months Vitals Screenings to include cognitive, depression, and falls Referrals and appointments  In addition, I have reviewed and discussed with patient certain preventive protocols, quality metrics, and best practice recommendations. A written personalized care plan for preventive services as well as general preventive health recommendations were provided to patient.     Sandrea Hammond, LPN   6/60/6301   Nurse Notes: None

## 2020-11-17 ENCOUNTER — Other Ambulatory Visit: Payer: Self-pay | Admitting: *Deleted

## 2020-11-17 ENCOUNTER — Ambulatory Visit: Payer: Medicare Other | Admitting: Family Medicine

## 2020-11-17 DIAGNOSIS — E1122 Type 2 diabetes mellitus with diabetic chronic kidney disease: Secondary | ICD-10-CM

## 2020-11-17 DIAGNOSIS — E782 Mixed hyperlipidemia: Secondary | ICD-10-CM

## 2020-11-17 DIAGNOSIS — I10 Essential (primary) hypertension: Secondary | ICD-10-CM

## 2020-11-17 MED ORDER — GLIMEPIRIDE 4 MG PO TABS: 4.0000 mg | ORAL_TABLET | Freq: Every day | ORAL | 0 refills | Status: DC

## 2020-11-17 MED ORDER — ATORVASTATIN CALCIUM 20 MG PO TABS: ORAL_TABLET | ORAL | 0 refills | Status: DC

## 2020-11-17 MED ORDER — AMLODIPINE BESYLATE 5 MG PO TABS: 5.0000 mg | ORAL_TABLET | Freq: Every day | ORAL | 0 refills | Status: DC

## 2020-11-17 MED ORDER — METOPROLOL SUCCINATE ER 25 MG PO TB24: 25.0000 mg | ORAL_TABLET | Freq: Every day | ORAL | 0 refills | Status: DC

## 2020-11-19 ENCOUNTER — Encounter: Payer: Self-pay | Admitting: Family Medicine

## 2020-11-19 ENCOUNTER — Other Ambulatory Visit: Payer: Self-pay

## 2020-11-19 ENCOUNTER — Ambulatory Visit (INDEPENDENT_AMBULATORY_CARE_PROVIDER_SITE_OTHER): Payer: Medicare Other | Admitting: Family Medicine

## 2020-11-19 VITALS — BP 137/70 | HR 67 | Temp 97.5°F | Ht 66.0 in | Wt 197.2 lb

## 2020-11-19 DIAGNOSIS — N183 Chronic kidney disease, stage 3 unspecified: Secondary | ICD-10-CM | POA: Diagnosis not present

## 2020-11-19 DIAGNOSIS — I10 Essential (primary) hypertension: Secondary | ICD-10-CM

## 2020-11-19 DIAGNOSIS — E782 Mixed hyperlipidemia: Secondary | ICD-10-CM

## 2020-11-19 DIAGNOSIS — E1122 Type 2 diabetes mellitus with diabetic chronic kidney disease: Secondary | ICD-10-CM

## 2020-11-19 LAB — BAYER DCA HB A1C WAIVED: HB A1C (BAYER DCA - WAIVED): 7.1 % — ABNORMAL HIGH (ref 4.8–5.6)

## 2020-11-19 MED ORDER — LINAGLIPTIN 5 MG PO TABS: ORAL_TABLET | ORAL | 3 refills | Status: DC

## 2020-11-19 MED ORDER — METOPROLOL SUCCINATE ER 25 MG PO TB24: 25.0000 mg | ORAL_TABLET | Freq: Every day | ORAL | 2 refills | Status: DC

## 2020-11-19 MED ORDER — ATORVASTATIN CALCIUM 20 MG PO TABS: ORAL_TABLET | ORAL | 2 refills | Status: DC

## 2020-11-19 MED ORDER — AMLODIPINE BESYLATE 5 MG PO TABS: 5.0000 mg | ORAL_TABLET | Freq: Every day | ORAL | 2 refills | Status: DC

## 2020-11-19 MED ORDER — GLIMEPIRIDE 4 MG PO TABS: 4.0000 mg | ORAL_TABLET | Freq: Every day | ORAL | 2 refills | Status: DC

## 2020-11-19 NOTE — Progress Notes (Signed)
Subjective:  Patient ID: Victor Tapia.,  male    DOB: 1926-10-22  Age: 87 y.o.    CC: Medical Management of Chronic Issues   HPI [place]. presents for  follow-up of hypertension. Patient has no history of headache chest pain or shortness of breath or recent cough. Patient also denies symptoms of TIA such as numbness weakness lateralizing. Patient denies side effects from medication. States taking it regularly.  Patient also  in for follow-up of elevated cholesterol. Doing well without complaints on current medication. Denies side effects  including myalgia and arthralgia and nausea. Also in today for liver function testing. Currently no chest pain, shortness of breath or other cardiovascular related symptoms noted.  Follow-up of diabetes. Patient does check blood sugar at home. Patient denies symptoms such as excessive hunger or urinary frequency, excessive hunger, nausea No significant hypoglycemic spells noted. Medications reviewed. Pt reports taking them regularly. Pt. denies complication/adverse reaction today.    History Viraat has a past medical history of CAD (coronary artery disease), Diabetes mellitus, Hyperlipidemia, Hypertension, OA (osteoarthritis), and Prostate cancer ([place]) (1999).   He has a past surgical history that includes TRIPLE BY PASS (1999); CATARACT  SURGERY RIGHT EYE (4/10); Knee surgery (Right); and Cataract extraction w/PHACO (Left, 12/09/2016).   His family history includes Cancer in his brother and sister.He reports that he quit smoking about 57 years ago. His smoking use included cigarettes. He has a 10.00 pack-year smoking history. He has never used smokeless tobacco. He reports current alcohol use of about 3.0 standard drinks per week. He reports that he does not use drugs.  Current Outpatient Medications on File Prior to Visit  Medication Sig Dispense Refill   aspirin 325 MG tablet Take 325 mg by mouth daily.     ibuprofen (ADVIL) 800 MG tablet  Take 800 mg by mouth every 8 (eight) hours as needed.     Multiple Vitamins-Minerals (MENS 50+ MULTI VITAMIN/MIN PO) Take 1 tablet by mouth daily.      tamsulosin (FLOMAX) 0.4 MG CAPS capsule TAKE 2 CAPSULES AT BEDTIME FOR URINE FLOW AND PROSTATE 180 capsule 3   No current facility-administered medications on file prior to visit.    ROS Review of Systems  Constitutional:  Negative for fever.  Respiratory:  Negative for shortness of breath.   Cardiovascular:  Negative for chest pain.  Musculoskeletal:  Negative for arthralgias.  Skin:  Negative for rash.   Objective:  BP 137/70   Pulse 67   Temp (!) 97.5 F (36.4 C)   Ht _0  (1.676 m)   Wt 197 lb 3.2 oz (89.4 kg)   SpO2 95%   BMI 31.83 kg/m   BP Readings from Last 3 Encounters:  11/19/20 137/70  03/26/20 134/73  09/30/19 139/69    Wt Readings from Last 3 Encounters:  11/19/20 197 lb 3.2 oz (89.4 kg)  10/28/20 205 lb (93 kg)  03/26/20 204 lb (92.5 kg)     Physical Exam Vitals reviewed.  Constitutional:      Appearance: He is well-developed.  HENT:     Head: Normocephalic and atraumatic.     Right Ear: External ear normal.     Left Ear: External ear normal.     Mouth/Throat:     Pharynx: No oropharyngeal exudate or posterior oropharyngeal erythema.  Eyes:     Pupils: Pupils are equal, round, and reactive to light.  Cardiovascular:     Rate and Rhythm: Normal rate and regular rhythm.  Heart sounds: No murmur heard. Pulmonary:     Effort: No respiratory distress.     Breath sounds: Normal breath sounds.  Musculoskeletal:     Cervical back: Normal range of motion and neck supple.  Neurological:     Mental Status: He is alert and oriented to person, place, and time.    Diabetic Foot Exam - Simple   Simple Foot Form Diabetic Foot exam was performed with the following findings: Yes 11/19/2020  3:15 PM  Visual Inspection See comments: Yes Sensation Testing Intact to touch and monofilament testing  bilaterally: Yes Pulse Check Posterior Tibialis and Dorsalis pulse intact bilaterally: Yes Comments Onychomycosis of both Great toes       Assessment & Plan:   [place] was seen today for medical management of chronic issues.  Diagnoses and all orders for this visit:  Essential hypertension, benign -     CMP14+EGFR -     amLODipine (NORVASC) 5 MG tablet; Take 1 tablet (5 mg total) by mouth daily. -     metoprolol succinate (TOPROL-XL) 25 MG 24 hr tablet; Take 1 tablet (25 mg total) by mouth daily.  Type 2 diabetes mellitus with stage 3 chronic kidney disease, without long-term current use of insulin, unspecified whether stage 3a or 3b CKD (HCC) -     Bayer DCA Hb A1c Waived -     CBC with Differential/Platelet -     Microalbumin / creatinine urine ratio -     atorvastatin (LIPITOR) 20 MG tablet; TAKE 1 TABLET DAILY AT 6 P.M. -     glimepiride (AMARYL) 4 MG tablet; Take 1 tablet (4 mg total) by mouth daily with breakfast. -     linagliptin (TRADJENTA) 5 MG TABS tablet; TAKE 1 TABLET DAILY FOR DIABETES  Mixed hyperlipidemia -     Lipid panel -     atorvastatin (LIPITOR) 20 MG tablet; TAKE 1 TABLET DAILY AT 6 P.M.  I have changed Kathrynn Humble Jr.'s Tradjenta to linagliptin. I am also having him maintain his aspirin, Multiple Vitamins-Minerals (MENS 50+ MULTI VITAMIN/MIN PO), tamsulosin, ibuprofen, amLODipine, atorvastatin, glimepiride, and metoprolol succinate.  Meds ordered this encounter  Medications   amLODipine (NORVASC) 5 MG tablet    Sig: Take 1 tablet (5 mg total) by mouth daily.    Dispense:  90 tablet    Refill:  2   atorvastatin (LIPITOR) 20 MG tablet    Sig: TAKE 1 TABLET DAILY AT 6 P.M.    Dispense:  90 tablet    Refill:  2   glimepiride (AMARYL) 4 MG tablet    Sig: Take 1 tablet (4 mg total) by mouth daily with breakfast.    Dispense:  90 tablet    Refill:  2   metoprolol succinate (TOPROL-XL) 25 MG 24 hr tablet    Sig: Take 1 tablet (25 mg total) by mouth  daily.    Dispense:  90 tablet    Refill:  2   linagliptin (TRADJENTA) 5 MG TABS tablet    Sig: TAKE 1 TABLET DAILY FOR DIABETES    Dispense:  90 tablet    Refill:  3     Follow-up: Return in about 6 months (around 05/20/2021).  Claretta Fraise, M.D.

## 2020-11-20 DIAGNOSIS — N183 Chronic kidney disease, stage 3 unspecified: Secondary | ICD-10-CM | POA: Diagnosis not present

## 2020-11-20 DIAGNOSIS — E1122 Type 2 diabetes mellitus with diabetic chronic kidney disease: Secondary | ICD-10-CM | POA: Diagnosis not present

## 2020-11-20 LAB — CBC WITH DIFFERENTIAL/PLATELET
Basophils Absolute: 0.1 10*3/uL (ref 0.0–0.2)
Basos: 1 %
EOS (ABSOLUTE): 0.2 10*3/uL (ref 0.0–0.4)
Eos: 2 %
Hematocrit: 39.4 % (ref 37.5–51.0)
Hemoglobin: 12.8 g/dL — ABNORMAL LOW (ref 13.0–17.7)
Immature Grans (Abs): 0 10*3/uL (ref 0.0–0.1)
Immature Granulocytes: 0 %
Lymphocytes Absolute: 2.6 10*3/uL (ref 0.7–3.1)
Lymphs: 30 %
MCH: 30.5 pg (ref 26.6–33.0)
MCHC: 32.5 g/dL (ref 31.5–35.7)
MCV: 94 fL (ref 79–97)
Monocytes Absolute: 0.6 10*3/uL (ref 0.1–0.9)
Monocytes: 7 %
Neutrophils Absolute: 5.2 10*3/uL (ref 1.4–7.0)
Neutrophils: 60 %
Platelets: 208 10*3/uL (ref 150–450)
RBC: 4.2 x10E6/uL (ref 4.14–5.80)
RDW: 13.1 % (ref 11.6–15.4)
WBC: 8.6 10*3/uL (ref 3.4–10.8)

## 2020-11-20 LAB — LIPID PANEL
Chol/HDL Ratio: 2.6 ratio (ref 0.0–5.0)
Cholesterol, Total: 119 mg/dL (ref 100–199)
HDL: 46 mg/dL (ref >39)
LDL Chol Calc (NIH): 50 mg/dL (ref 0–99)
Triglycerides: 130 mg/dL (ref 0–149)
VLDL Cholesterol Cal: 23 mg/dL (ref 5–40)

## 2020-11-20 LAB — CMP14+EGFR
ALT: 10 IU/L (ref 0–44)
AST: 18 IU/L (ref 0–40)
Albumin/Globulin Ratio: 1.3 (ref 1.2–2.2)
Albumin: 4 g/dL (ref 3.5–4.6)
Alkaline Phosphatase: 95 IU/L (ref 44–121)
BUN/Creatinine Ratio: 15 (ref 10–24)
BUN: 23 mg/dL (ref 10–36)
Bilirubin Total: 0.3 mg/dL (ref 0.0–1.2)
CO2: 24 mmol/L (ref 20–29)
Calcium: 9.1 mg/dL (ref 8.6–10.2)
Chloride: 103 mmol/L (ref 96–106)
Creatinine, Ser: 1.57 mg/dL — ABNORMAL HIGH (ref 0.76–1.27)
Globulin, Total: 3 g/dL (ref 1.5–4.5)
Glucose: 177 mg/dL — ABNORMAL HIGH (ref 70–99)
Potassium: 5.2 mmol/L (ref 3.5–5.2)
Sodium: 142 mmol/L (ref 134–144)
Total Protein: 7 g/dL (ref 6.0–8.5)
eGFR: 41 mL/min/{1.73_m2} — ABNORMAL LOW (ref >59)

## 2020-11-21 LAB — MICROALBUMIN / CREATININE URINE RATIO
Creatinine, Urine: 145.8 mg/dL
Microalb/Creat Ratio: 17 mg/g creat (ref 0–29)
Microalbumin, Urine: 25.4 ug/mL

## 2021-05-10 ENCOUNTER — Other Ambulatory Visit: Payer: Self-pay | Admitting: Nurse Practitioner

## 2021-05-10 DIAGNOSIS — N401 Enlarged prostate with lower urinary tract symptoms: Secondary | ICD-10-CM

## 2021-05-19 ENCOUNTER — Ambulatory Visit: Payer: Medicare Other | Admitting: Family Medicine

## 2021-08-09 ENCOUNTER — Other Ambulatory Visit: Payer: Self-pay | Admitting: Family Medicine

## 2021-08-09 DIAGNOSIS — N401 Enlarged prostate with lower urinary tract symptoms: Secondary | ICD-10-CM

## 2021-08-17 ENCOUNTER — Other Ambulatory Visit: Payer: Self-pay | Admitting: Family Medicine

## 2021-08-17 DIAGNOSIS — I10 Essential (primary) hypertension: Secondary | ICD-10-CM

## 2021-08-29 ENCOUNTER — Other Ambulatory Visit: Payer: Self-pay | Admitting: Family Medicine

## 2021-08-29 DIAGNOSIS — I10 Essential (primary) hypertension: Secondary | ICD-10-CM

## 2021-09-01 ENCOUNTER — Other Ambulatory Visit: Payer: Self-pay | Admitting: Family Medicine

## 2021-09-01 DIAGNOSIS — I10 Essential (primary) hypertension: Secondary | ICD-10-CM

## 2021-09-05 ENCOUNTER — Other Ambulatory Visit: Payer: Self-pay | Admitting: Family Medicine

## 2021-09-05 DIAGNOSIS — N183 Chronic kidney disease, stage 3 unspecified: Secondary | ICD-10-CM

## 2021-09-13 ENCOUNTER — Encounter: Payer: Self-pay | Admitting: Family Medicine

## 2021-09-13 ENCOUNTER — Ambulatory Visit (INDEPENDENT_AMBULATORY_CARE_PROVIDER_SITE_OTHER): Payer: Medicare Other | Admitting: Family Medicine

## 2021-09-13 VITALS — BP 156/56 | HR 55 | Temp 98.2°F | Ht 66.0 in | Wt 185.4 lb

## 2021-09-13 DIAGNOSIS — E782 Mixed hyperlipidemia: Secondary | ICD-10-CM | POA: Diagnosis not present

## 2021-09-13 DIAGNOSIS — I10 Essential (primary) hypertension: Secondary | ICD-10-CM

## 2021-09-13 DIAGNOSIS — E1122 Type 2 diabetes mellitus with diabetic chronic kidney disease: Secondary | ICD-10-CM

## 2021-09-13 DIAGNOSIS — N401 Enlarged prostate with lower urinary tract symptoms: Secondary | ICD-10-CM

## 2021-09-13 DIAGNOSIS — N183 Chronic kidney disease, stage 3 unspecified: Secondary | ICD-10-CM

## 2021-09-13 DIAGNOSIS — R350 Frequency of micturition: Secondary | ICD-10-CM | POA: Diagnosis not present

## 2021-09-13 LAB — BAYER DCA HB A1C WAIVED: HB A1C (BAYER DCA - WAIVED): 5.8 % — ABNORMAL HIGH (ref 4.8–5.6)

## 2021-09-13 MED ORDER — METOPROLOL SUCCINATE ER 25 MG PO TB24: 25.0000 mg | ORAL_TABLET | Freq: Every day | ORAL | 2 refills | Status: DC

## 2021-09-13 MED ORDER — LINAGLIPTIN 5 MG PO TABS: ORAL_TABLET | ORAL | 3 refills | Status: DC

## 2021-09-13 MED ORDER — TAMSULOSIN HCL 0.4 MG PO CAPS
ORAL_CAPSULE | ORAL | 3 refills | Status: DC
Start: 2021-09-13 — End: 2022-09-21

## 2021-09-13 MED ORDER — GLIMEPIRIDE 4 MG PO TABS: 4.0000 mg | ORAL_TABLET | Freq: Every day | ORAL | 2 refills | Status: DC

## 2021-09-13 MED ORDER — ATORVASTATIN CALCIUM 20 MG PO TABS: ORAL_TABLET | ORAL | 3 refills | Status: DC

## 2021-09-13 NOTE — Progress Notes (Signed)
Subjective:  Patient ID: Victor Journey.,  male    DOB: 1926-09-10  Age: 88 y.o.    CC: Medical Management of Chronic Issues   HPI Victor Tapia. presents for  follow-up of hypertension. Patient has no history of headache chest pain or shortness of breath or recent cough. Patient also denies symptoms of TIA such as numbness weakness lateralizing. Patient denies side effects from medication. States taking it regularly. Wife had injury & has Lewy body dementia. Pt. Feels that is source of BP elevation. Walks for exercise.  Patient also  in for follow-up of elevated cholesterol. Doing well without complaints on current medication. Denies side effects  including myalgia and arthralgia and nausea. Also in today for liver function testing. Currently no chest pain, shortness of breath or other cardiovascular related symptoms noted.  Follow-up of diabetes. Patient does not check blood sugar at home. Patient denies symptoms such as excessive hunger or urinary frequency, excessive hunger, nausea No significant hypoglycemic spells noted. Medications reviewed. Pt reports taking them regularly. Pt. denies complication/adverse reaction today.    History Victor Tapia has a past medical history of CAD (coronary artery disease), Diabetes mellitus, Hyperlipidemia, Hypertension, OA (osteoarthritis), and Prostate cancer ([place]) (1999).   He has a past surgical history that includes TRIPLE BY PASS (1999); CATARACT  SURGERY RIGHT EYE (4/10); Knee surgery (Right); and Cataract extraction w/PHACO (Left, 12/09/2016).   His family history includes Cancer in his brother and sister.He reports that he quit smoking about 58 years ago. His smoking use included cigarettes. He has a 10.00 pack-year smoking history. He has never used smokeless tobacco. He reports current alcohol use of about 3.0 standard drinks of alcohol per week. He reports that he does not use drugs.  Current Outpatient Medications on File Prior to Visit   Medication Sig Dispense Refill   amLODipine (NORVASC) 5 MG tablet Take 1 tablet (5 mg total) by mouth daily. 90 tablet 0   aspirin 325 MG tablet Take 325 mg by mouth daily.     ibuprofen (ADVIL) 800 MG tablet Take 800 mg by mouth every 8 (eight) hours as needed.     Multiple Vitamins-Minerals (MENS 50+ MULTI VITAMIN/MIN PO) Take 1 tablet by mouth daily.      No current facility-administered medications on file prior to visit.    ROS Review of Systems  Constitutional: Negative.   HENT: Negative.    Eyes:  Negative for visual disturbance.  Respiratory:  Negative for cough and shortness of breath.   Cardiovascular:  Negative for chest pain and leg swelling.  Gastrointestinal:  Negative for abdominal pain, diarrhea, nausea and vomiting.  Genitourinary:  Negative for difficulty urinating.  Musculoskeletal:  Negative for arthralgias and myalgias.  Skin:  Negative for rash.  Neurological:  Negative for headaches.  Psychiatric/Behavioral:  Negative for sleep disturbance.     Objective:  BP (!) 156/56   Pulse (!) 55   Temp 98.2 F (36.8 C)   Ht 5' 6"  (1.676 m)   Wt 185 lb 6.4 oz (84.1 kg)   SpO2 96%   BMI 29.92 kg/m   BP Readings from Last 3 Encounters:  09/13/21 (!) 156/56  11/19/20 137/70  03/26/20 134/73    Wt Readings from Last 3 Encounters:  09/13/21 185 lb 6.4 oz (84.1 kg)  11/19/20 197 lb 3.2 oz (89.4 kg)  10/28/20 205 lb (93 kg)     Physical Exam Vitals reviewed.  Constitutional:      Appearance: He is well-developed.  HENT:     Head: Normocephalic and atraumatic.     Right Ear: External ear normal.     Left Ear: External ear normal.     Mouth/Throat:     Pharynx: No oropharyngeal exudate or posterior oropharyngeal erythema.  Eyes:     Pupils: Pupils are equal, round, and reactive to light.  Cardiovascular:     Rate and Rhythm: Normal rate and regular rhythm.     Heart sounds: No murmur heard. Pulmonary:     Effort: No respiratory distress.     Breath  sounds: Normal breath sounds.  Musculoskeletal:     Cervical back: Normal range of motion and neck supple.  Neurological:     Mental Status: He is alert and oriented to person, place, and time.     Diabetic Foot Exam - Simple   No data filed     Lab Results  Component Value Date   HGBA1C 7.1 (H) 11/19/2020   HGBA1C 6.7 03/26/2020   HGBA1C 7.2 (H) 09/30/2019    Assessment & Plan:   [name] was seen today for medical management of chronic issues.  Diagnoses and all orders for this visit:  Essential hypertension, benign -     CBC with Differential/Platelet -     CMP14+EGFR -     metoprolol succinate (TOPROL-XL) 25 MG 24 hr tablet; Take 1 tablet (25 mg total) by mouth daily.  Type 2 diabetes mellitus with stage 3 chronic kidney disease, without long-term current use of insulin, unspecified whether stage 3a or 3b CKD (HCC) -     Bayer DCA Hb A1c Waived -     atorvastatin (LIPITOR) 20 MG tablet; TAKE 1 TABLET DAILY AT 6 P.M. -     glimepiride (AMARYL) 4 MG tablet; Take 1 tablet (4 mg total) by mouth daily with breakfast. -     linagliptin (TRADJENTA) 5 MG TABS tablet; TAKE 1 TABLET DAILY FOR DIABETES  Mixed hyperlipidemia -     Lipid panel -     atorvastatin (LIPITOR) 20 MG tablet; TAKE 1 TABLET DAILY AT 6 P.M.  Benign prostatic hyperplasia with urinary frequency -     tamsulosin (FLOMAX) 0.4 MG CAPS capsule; TAKE 2 CAPSULES AT BEDTIME FOR URINE FLOW AND PROSTATE   I have changed Victor Humble Jr.'s glimepiride and metoprolol succinate. I am also having him maintain his aspirin, Multiple Vitamins-Minerals (MENS 50+ MULTI VITAMIN/MIN PO), ibuprofen, amLODipine, atorvastatin, linagliptin, and tamsulosin.  Meds ordered this encounter  Medications   atorvastatin (LIPITOR) 20 MG tablet    Sig: TAKE 1 TABLET DAILY AT 6 P.M.    Dispense:  90 tablet    Refill:  3   glimepiride (AMARYL) 4 MG tablet    Sig: Take 1 tablet (4 mg total) by mouth daily with breakfast.    Dispense:  90  tablet    Refill:  2   linagliptin (TRADJENTA) 5 MG TABS tablet    Sig: TAKE 1 TABLET DAILY FOR DIABETES    Dispense:  90 tablet    Refill:  3   metoprolol succinate (TOPROL-XL) 25 MG 24 hr tablet    Sig: Take 1 tablet (25 mg total) by mouth daily.    Dispense:  90 tablet    Refill:  2   tamsulosin (FLOMAX) 0.4 MG CAPS capsule    Sig: TAKE 2 CAPSULES AT BEDTIME FOR URINE FLOW AND PROSTATE    Dispense:  180 capsule    Refill:  3     Follow-up:  Return in about 3 months (around 12/14/2021).  Claretta Fraise, M.D.

## 2021-09-14 LAB — CBC WITH DIFFERENTIAL/PLATELET
Basophils Absolute: 0 10*3/uL (ref 0.0–0.2)
Basos: 0 %
EOS (ABSOLUTE): 0.1 10*3/uL (ref 0.0–0.4)
Eos: 1 %
Hematocrit: 35.9 % — ABNORMAL LOW (ref 37.5–51.0)
Hemoglobin: 11.9 g/dL — ABNORMAL LOW (ref 13.0–17.7)
Immature Grans (Abs): 0 10*3/uL (ref 0.0–0.1)
Immature Granulocytes: 1 %
Lymphocytes Absolute: 2.2 10*3/uL (ref 0.7–3.1)
Lymphs: 26 %
MCH: 31.1 pg (ref 26.6–33.0)
MCHC: 33.1 g/dL (ref 31.5–35.7)
MCV: 94 fL (ref 79–97)
Monocytes Absolute: 0.6 10*3/uL (ref 0.1–0.9)
Monocytes: 7 %
Neutrophils Absolute: 5.6 10*3/uL (ref 1.4–7.0)
Neutrophils: 65 %
Platelets: 171 10*3/uL (ref 150–450)
RBC: 3.83 x10E6/uL — ABNORMAL LOW (ref 4.14–5.80)
RDW: 12.5 % (ref 11.6–15.4)
WBC: 8.6 10*3/uL (ref 3.4–10.8)

## 2021-09-14 LAB — LIPID PANEL
Chol/HDL Ratio: 2.2 ratio (ref 0.0–5.0)
Cholesterol, Total: 107 mg/dL (ref 100–199)
HDL: 48 mg/dL (ref >39)
LDL Chol Calc (NIH): 41 mg/dL (ref 0–99)
Triglycerides: 92 mg/dL (ref 0–149)
VLDL Cholesterol Cal: 18 mg/dL (ref 5–40)

## 2021-09-14 LAB — CMP14+EGFR
ALT: 10 IU/L (ref 0–44)
AST: 14 IU/L (ref 0–40)
Albumin/Globulin Ratio: 1.4 (ref 1.2–2.2)
Albumin: 3.9 g/dL (ref 3.6–4.6)
Alkaline Phosphatase: 97 IU/L (ref 44–121)
BUN/Creatinine Ratio: 17 (ref 10–24)
BUN: 27 mg/dL (ref 10–36)
Bilirubin Total: 0.4 mg/dL (ref 0.0–1.2)
CO2: 23 mmol/L (ref 20–29)
Calcium: 9.3 mg/dL (ref 8.6–10.2)
Chloride: 105 mmol/L (ref 96–106)
Creatinine, Ser: 1.56 mg/dL — ABNORMAL HIGH (ref 0.76–1.27)
Globulin, Total: 2.8 g/dL (ref 1.5–4.5)
Glucose: 166 mg/dL — ABNORMAL HIGH (ref 70–99)
Potassium: 4.8 mmol/L (ref 3.5–5.2)
Sodium: 142 mmol/L (ref 134–144)
Total Protein: 6.7 g/dL (ref 6.0–8.5)
eGFR: 41 mL/min/{1.73_m2} — ABNORMAL LOW (ref >59)

## 2021-12-14 ENCOUNTER — Ambulatory Visit: Payer: Medicare Other | Admitting: Family Medicine

## 2022-01-17 ENCOUNTER — Ambulatory Visit (INDEPENDENT_AMBULATORY_CARE_PROVIDER_SITE_OTHER): Payer: Medicare Other | Admitting: Family Medicine

## 2022-01-17 ENCOUNTER — Encounter: Payer: Self-pay | Admitting: Family Medicine

## 2022-01-17 VITALS — BP 159/57 | HR 71 | Temp 97.6°F | Ht 66.0 in | Wt 187.2 lb

## 2022-01-17 DIAGNOSIS — N183 Chronic kidney disease, stage 3 unspecified: Secondary | ICD-10-CM

## 2022-01-17 DIAGNOSIS — E782 Mixed hyperlipidemia: Secondary | ICD-10-CM

## 2022-01-17 DIAGNOSIS — I10 Essential (primary) hypertension: Secondary | ICD-10-CM | POA: Diagnosis not present

## 2022-01-17 DIAGNOSIS — E1122 Type 2 diabetes mellitus with diabetic chronic kidney disease: Secondary | ICD-10-CM

## 2022-01-17 LAB — BAYER DCA HB A1C WAIVED: HB A1C (BAYER DCA - WAIVED): 6.9 % — ABNORMAL HIGH (ref 4.8–5.6)

## 2022-01-17 MED ORDER — AMLODIPINE BESYLATE 5 MG PO TABS: 5.0000 mg | ORAL_TABLET | Freq: Every day | ORAL | 3 refills | Status: DC

## 2022-01-17 NOTE — Progress Notes (Signed)
Subjective:  Patient ID: Victor Journey.,  male    DOB: [birth year]  Age: 88 y.o.    CC: Medical Management of Chronic Issues   HPI Victor Diel. presents for  follow-up of hypertension. Patient has no history of headache chest pain or shortness of breath or recent cough. Patient also denies symptoms of TIA such as numbness weakness lateralizing. Patient denies side effects from medication. States taking it regularly.Worried about wife today. Feels that raised his BP.   Patient also  in for follow-up of elevated cholesterol. Doing well without complaints on current medication. Denies side effects  including myalgia and arthralgia and nausea. Also in today for liver function testing. Currently no chest pain, shortness of breath or other cardiovascular related symptoms noted.  Follow-up of diabetes. Patient does not check blood sugar at home. Patient denies symptoms such as excessive hunger or urinary frequency, excessive hunger, nausea No significant hypoglycemic spells noted. Medications reviewed. Pt reports taking them regularly. Pt. denies complication/adverse reaction today.    History Victor Tapia has a past medical history of CAD (coronary artery disease), Diabetes mellitus, Hyperlipidemia, Hypertension, OA (osteoarthritis), and Prostate cancer ([place]) (1999).   He has a past surgical history that includes TRIPLE BY PASS (1999); CATARACT  SURGERY RIGHT EYE (4/10); Knee surgery (Right); and Cataract extraction w/PHACO (Left, 12/09/2016).   His family history includes Cancer in his brother and sister.He reports that he quit smoking about 59 years ago. His smoking use included cigarettes. He has a 10.00 pack-year smoking history. He has never used smokeless tobacco. He reports current alcohol use of about 3.0 standard drinks of alcohol per week. He reports that he does not use drugs.  Current Outpatient Medications on File Prior to Visit  Medication Sig Dispense Refill   aspirin 325 MG tablet  Take 325 mg by mouth daily.     atorvastatin (LIPITOR) 20 MG tablet TAKE 1 TABLET DAILY AT 6 P.M. 90 tablet 3   glimepiride (AMARYL) 4 MG tablet Take 1 tablet (4 mg total) by mouth daily with breakfast. 90 tablet 2   ibuprofen (ADVIL) 800 MG tablet Take 800 mg by mouth every 8 (eight) hours as needed.     linagliptin (TRADJENTA) 5 MG TABS tablet TAKE 1 TABLET DAILY FOR DIABETES 90 tablet 3   metoprolol succinate (TOPROL-XL) 25 MG 24 hr tablet Take 1 tablet (25 mg total) by mouth daily. 90 tablet 2   Multiple Vitamins-Minerals (MENS 50+ MULTI VITAMIN/MIN PO) Take 1 tablet by mouth daily.      tamsulosin (FLOMAX) 0.4 MG CAPS capsule TAKE 2 CAPSULES AT BEDTIME FOR URINE FLOW AND PROSTATE 180 capsule 3   No current facility-administered medications on file prior to visit.    ROS Review of Systems  Constitutional:  Negative for fever.  Respiratory:  Negative for shortness of breath.   Cardiovascular:  Negative for chest pain.  Musculoskeletal:  Negative for arthralgias.  Skin:  Negative for rash.    Objective:  BP (!) 159/57   Pulse 71   Temp 97.6 F (36.4 C)   Ht _0  (1.676 m)   Wt 187 lb 3.2 oz (84.9 kg)   SpO2 97%   BMI 30.21 kg/m   BP Readings from Last 3 Encounters:  01/17/22 (!) 159/57  09/13/21 (!) 156/56  11/19/20 137/70    Wt Readings from Last 3 Encounters:  01/17/22 187 lb 3.2 oz (84.9 kg)  09/13/21 185 lb 6.4 oz (84.1 kg)  11/19/20 197 lb 3.2  oz (89.4 kg)     Physical Exam Vitals reviewed.  Constitutional:      Appearance: He is well-developed.  HENT:     Head: Normocephalic and atraumatic.     Right Ear: External ear normal.     Left Ear: External ear normal.     Mouth/Throat:     Pharynx: No oropharyngeal exudate or posterior oropharyngeal erythema.  Eyes:     Pupils: Pupils are equal, round, and reactive to light.  Cardiovascular:     Rate and Rhythm: Normal rate and regular rhythm.     Heart sounds: No murmur heard. Pulmonary:     Effort: No  respiratory distress.     Breath sounds: Normal breath sounds.  Musculoskeletal:     Cervical back: Normal range of motion and neck supple.  Neurological:     Mental Status: He is alert and oriented to person, place, and time.     Diabetic Foot Exam - Simple   No data filed     Lab Results  Component Value Date   HGBA1C 6.9 (H) 01/17/2022   HGBA1C 5.8 (H) 09/13/2021   HGBA1C 7.1 (H) 11/19/2020    Assessment & Plan:   Victor Tapia was seen today for medical management of chronic issues.  Diagnoses and all orders for this visit:  Essential hypertension, benign -     CBC with Differential/Platelet -     CMP14+EGFR -     amLODipine (NORVASC) 5 MG tablet; Take 1 tablet (5 mg total) by mouth daily.  Type 2 diabetes mellitus with stage 3 chronic kidney disease, without long-term current use of insulin, unspecified whether stage 3a or 3b CKD ([place]) -     Microalbumin / creatinine urine ratio -     Bayer DCA Hb A1c Waived  Mixed hyperlipidemia -     Lipid panel   I am having Victor Journey. maintain his aspirin, Multiple Vitamins-Minerals (MENS 50+ MULTI VITAMIN/MIN PO), ibuprofen, atorvastatin, glimepiride, linagliptin, metoprolol succinate, tamsulosin, and amLODipine.  Meds ordered this encounter  Medications   amLODipine (NORVASC) 5 MG tablet    Sig: Take 1 tablet (5 mg total) by mouth daily.    Dispense:  90 tablet    Refill:  3     Follow-up: Return in about 3 months (around 04/18/2022).  Claretta Fraise, M.D.

## 2022-01-18 LAB — CBC WITH DIFFERENTIAL/PLATELET
Basophils Absolute: 0 10*3/uL (ref 0.0–0.2)
Basos: 0 %
EOS (ABSOLUTE): 0.1 10*3/uL (ref 0.0–0.4)
Eos: 1 %
Hematocrit: 35.7 % — ABNORMAL LOW (ref 37.5–51.0)
Hemoglobin: 11.7 g/dL — ABNORMAL LOW (ref 13.0–17.7)
Immature Grans (Abs): 0 10*3/uL (ref 0.0–0.1)
Immature Granulocytes: 0 %
Lymphocytes Absolute: 2.2 10*3/uL (ref 0.7–3.1)
Lymphs: 28 %
MCH: 31.1 pg (ref 26.6–33.0)
MCHC: 32.8 g/dL (ref 31.5–35.7)
MCV: 95 fL (ref 79–97)
Monocytes Absolute: 0.7 10*3/uL (ref 0.1–0.9)
Monocytes: 8 %
Neutrophils Absolute: 4.9 10*3/uL (ref 1.4–7.0)
Neutrophils: 63 %
Platelets: 161 10*3/uL (ref 150–450)
RBC: 3.76 x10E6/uL — ABNORMAL LOW (ref 4.14–5.80)
RDW: 12.8 % (ref 11.6–15.4)
WBC: 7.9 10*3/uL (ref 3.4–10.8)

## 2022-01-18 LAB — CMP14+EGFR
ALT: 13 IU/L (ref 0–44)
AST: 17 IU/L (ref 0–40)
Albumin/Globulin Ratio: 1.4 (ref 1.2–2.2)
Albumin: 3.9 g/dL (ref 3.6–4.6)
Alkaline Phosphatase: 84 IU/L (ref 44–121)
BUN/Creatinine Ratio: 19 (ref 10–24)
BUN: 30 mg/dL (ref 10–36)
Bilirubin Total: 0.3 mg/dL (ref 0.0–1.2)
CO2: 23 mmol/L (ref 20–29)
Calcium: 9 mg/dL (ref 8.6–10.2)
Chloride: 106 mmol/L (ref 96–106)
Creatinine, Ser: 1.57 mg/dL — ABNORMAL HIGH (ref 0.76–1.27)
Globulin, Total: 2.7 g/dL (ref 1.5–4.5)
Glucose: 107 mg/dL — ABNORMAL HIGH (ref 70–99)
Potassium: 4.2 mmol/L (ref 3.5–5.2)
Sodium: 142 mmol/L (ref 134–144)
Total Protein: 6.6 g/dL (ref 6.0–8.5)
eGFR: 41 mL/min/{1.73_m2} — ABNORMAL LOW (ref >59)

## 2022-01-18 LAB — LIPID PANEL
Chol/HDL Ratio: 2.1 ratio (ref 0.0–5.0)
Cholesterol, Total: 115 mg/dL (ref 100–199)
HDL: 55 mg/dL (ref >39)
LDL Chol Calc (NIH): 45 mg/dL (ref 0–99)
Triglycerides: 76 mg/dL (ref 0–149)
VLDL Cholesterol Cal: 15 mg/dL (ref 5–40)

## 2022-01-19 ENCOUNTER — Other Ambulatory Visit: Payer: Medicare Other

## 2022-01-19 DIAGNOSIS — N183 Chronic kidney disease, stage 3 unspecified: Secondary | ICD-10-CM | POA: Diagnosis not present

## 2022-01-19 DIAGNOSIS — E1122 Type 2 diabetes mellitus with diabetic chronic kidney disease: Secondary | ICD-10-CM | POA: Diagnosis not present

## 2022-01-20 LAB — MICROALBUMIN / CREATININE URINE RATIO
Creatinine, Urine: 77.4 mg/dL
Microalb/Creat Ratio: <4 mg/g creat (ref 0–29)
Microalbumin, Urine: <3 ug/mL

## 2022-01-23 NOTE — Progress Notes (Signed)
  Hello Malachy,  Your lab result is normal and/or stable.Some minor variations that are not significant are commonly marked abnormal, but do not represent any medical problem for you.  Best regards, Claretta Fraise, M.D.

## 2022-02-21 ENCOUNTER — Ambulatory Visit (INDEPENDENT_AMBULATORY_CARE_PROVIDER_SITE_OTHER): Payer: Medicare Other

## 2022-02-21 VITALS — Ht 69.0 in | Wt 190.0 lb

## 2022-02-21 DIAGNOSIS — Z0000 Encounter for general adult medical examination without abnormal findings: Secondary | ICD-10-CM | POA: Diagnosis not present

## 2022-02-21 NOTE — Patient Instructions (Signed)
[name] , Thank you for taking time to come for your Medicare Wellness Visit. I appreciate your ongoing commitment to your health goals. Please review the following plan we discussed and let me know if I can assist you in the future.   These are the goals we discussed:  Goals      Exercise 150 min/wk Moderate Activity        This is a list of the screening recommended for you and due dates:  Health Maintenance  Topic Date Due   DTaP/Tdap/Td vaccine (1 - Tdap) Never done   Eye exam for diabetics  11/12/2019   Complete foot exam   11/19/2021   COVID-19 Vaccine (6 - 2023-24 season) 01/11/2022   Zoster (Shingles) Vaccine (1 of 2) 04/18/2022*   Pneumonia Vaccine (2 - PCV) 09/14/2022*   Hemoglobin A1C  07/19/2022   Medicare Annual Wellness Visit  02/22/2023   Flu Shot  Completed   HPV Vaccine  Aged Out  *Topic was postponed. The date shown is not the original due date.    Advanced directives: Advance directive discussed with you today. I have provided a copy for you to complete at home and have notarized. Once this is complete please bring a copy in to our office so we can scan it into your chart.   Conditions/risks identified: Aim for 30 minutes of exercise or brisk walking, 6-8 glasses of water, and 5 servings of fruits and vegetables each day.   Next appointment: Follow up in one year for your annual wellness visit.   Preventive Care 46 Years and Older, Male  Preventive care refers to lifestyle choices and visits with your health care provider that can promote health and wellness. What does preventive care include? A yearly physical exam. This is also called an annual well check. Dental exams once or twice a year. Routine eye exams. Ask your health care provider how often you should have your eyes checked. Personal lifestyle choices, including: Daily care of your teeth and gums. Regular physical activity. Eating a healthy diet. Avoiding tobacco and drug use. Limiting alcohol  use. Practicing safe sex. Taking low doses of aspirin every day. Taking vitamin and mineral supplements as recommended by your health care provider. What happens during an annual well check? The services and screenings done by your health care provider during your annual well check will depend on your age, overall health, lifestyle risk factors, and family history of disease. Counseling  Your health care provider may ask you questions about your: Alcohol use. Tobacco use. Drug use. Emotional well-being. Home and relationship well-being. Sexual activity. Eating habits. History of falls. Memory and ability to understand (cognition). Work and work Statistician. Screening  You may have the following tests or measurements: Height, weight, and BMI. Blood pressure. Lipid and cholesterol levels. These may be checked every 5 years, or more frequently if you are over 86 years old. Skin check. Lung cancer screening. You may have this screening every year starting at age 79 if you have a 30-pack-year history of smoking and currently smoke or have quit within the past 15 years. Fecal occult blood test (FOBT) of the stool. You may have this test every year starting at age 70. Flexible sigmoidoscopy or colonoscopy. You may have a sigmoidoscopy every 5 years or a colonoscopy every 10 years starting at age 40. Prostate cancer screening. Recommendations will vary depending on your family history and other risks. Hepatitis C blood test. Hepatitis B blood test. Sexually transmitted disease (  STD) testing. Diabetes screening. This is done by checking your blood sugar (glucose) after you have not eaten for a while (fasting). You may have this done every 1-3 years. Abdominal aortic aneurysm (AAA) screening. You may need this if you are a current or former smoker. Osteoporosis. You may be screened starting at age 65 if you are at high risk. Talk with your health care provider about your test results,  treatment options, and if necessary, the need for more tests. Vaccines  Your health care provider may recommend certain vaccines, such as: Influenza vaccine. This is recommended every year. Tetanus, diphtheria, and acellular pertussis (Tdap, Td) vaccine. You may need a Td booster every 10 years. Zoster vaccine. You may need this after age 4. Pneumococcal 13-valent conjugate (PCV13) vaccine. One dose is recommended after age 50. Pneumococcal polysaccharide (PPSV23) vaccine. One dose is recommended after age 57. Talk to your health care provider about which screenings and vaccines you need and how often you need them. This information is not intended to replace advice given to you by your health care provider. Make sure you discuss any questions you have with your health care provider. Document Released: 02/20/2015 Document Revised: 10/14/2015 Document Reviewed: 11/25/2014 Elsevier Interactive Patient Education  2017 [place] Prevention in the Home Falls can cause injuries. They can happen to people of all ages. There are many things you can do to make your home safe and to help prevent falls. What can I do on the outside of my home? Regularly fix the edges of walkways and driveways and fix any cracks. Remove anything that might make you trip as you walk through a door, such as a raised step or threshold. Trim any bushes or trees on the path to your home. Use bright outdoor lighting. Clear any walking paths of anything that might make someone trip, such as rocks or tools. Regularly check to see if handrails are loose or broken. Make sure that both sides of any steps have handrails. Any raised decks and porches should have guardrails on the edges. Have any leaves, snow, or ice cleared regularly. Use sand or salt on walking paths during winter. Clean up any spills in your garage right away. This includes oil or grease spills. What can I do in the bathroom? Use night  lights. Install grab bars by the toilet and in the tub and shower. Do not use towel bars as grab bars. Use non-skid mats or decals in the tub or shower. If you need to sit down in the shower, use a plastic, non-slip stool. Keep the floor dry. Clean up any water that spills on the floor as soon as it happens. Remove soap buildup in the tub or shower regularly. Attach bath mats securely with double-sided non-slip rug tape. Do not have throw rugs and other things on the floor that can make you trip. What can I do in the bedroom? Use night lights. Make sure that you have a light by your bed that is easy to reach. Do not use any sheets or blankets that are too big for your bed. They should not hang down onto the floor. Have a firm chair that has side arms. You can use this for support while you get dressed. Do not have throw rugs and other things on the floor that can make you trip. What can I do in the kitchen? Clean up any spills right away. Avoid walking on wet floors. Keep items that you use a lot  in easy-to-reach places. If you need to reach something above you, use a strong step stool that has a grab bar. Keep electrical cords out of the way. Do not use floor polish or wax that makes floors slippery. If you must use wax, use non-skid floor wax. Do not have throw rugs and other things on the floor that can make you trip. What can I do with my stairs? Do not leave any items on the stairs. Make sure that there are handrails on both sides of the stairs and use them. Fix handrails that are broken or loose. Make sure that handrails are as long as the stairways. Check any carpeting to make sure that it is firmly attached to the stairs. Fix any carpet that is loose or worn. Avoid having throw rugs at the top or bottom of the stairs. If you do have throw rugs, attach them to the floor with carpet tape. Make sure that you have a light switch at the top of the stairs and the bottom of the stairs. If  you do not have them, ask someone to add them for you. What else can I do to help prevent falls? Wear shoes that: Do not have high heels. Have rubber bottoms. Are comfortable and fit you well. Are closed at the toe. Do not wear sandals. If you use a stepladder: Make sure that it is fully opened. Do not climb a closed stepladder. Make sure that both sides of the stepladder are locked into place. Ask someone to hold it for you, if possible. Clearly mark and make sure that you can see: Any grab bars or handrails. First and last steps. Where the edge of each step is. Use tools that help you move around (mobility aids) if they are needed. These include: Canes. Walkers. Scooters. Crutches. Turn on the lights when you go into a dark area. Replace any light bulbs as soon as they burn out. Set up your furniture so you have a clear path. Avoid moving your furniture around. If any of your floors are uneven, fix them. If there are any pets around you, be aware of where they are. Review your medicines with your doctor. Some medicines can make you feel dizzy. This can increase your chance of falling. Ask your doctor what other things that you can do to help prevent falls. This information is not intended to replace advice given to you by your health care provider. Make sure you discuss any questions you have with your health care provider. Document Released: 11/20/2008 Document Revised: 07/02/2015 Document Reviewed: 02/28/2014 Elsevier Interactive Patient Education  2017 [employer].

## 2022-02-21 NOTE — Progress Notes (Signed)
Subjective:   Victor Tapia. is a 89 y.o. male who presents for Medicare Annual/Subsequent preventive examination. I connected with  Radene Journey. on 02/21/22 by a audio enabled telemedicine application and verified that I am speaking with the correct person using two identifiers.  Patient Location: Home  Provider Location: Home Office  I discussed the limitations of evaluation and management by telemedicine. The patient expressed understanding and agreed to proceed.  Review of Systems     Cardiac Risk Factors include: advanced age (>39mn, >>49women);diabetes mellitus;hypertension;male gender;dyslipidemia     Objective:    Today's Vitals   02/21/22 0942  Weight: 190 lb (86.2 kg)  Height: '5\' 9"'$  (1.753 m)   Body mass index is 28.06 kg/m.     02/21/2022    9:45 AM 10/28/2020    8:24 AM 04/20/2017    2:10 PM 12/09/2016    7:44 AM 12/07/2016    2:23 PM  Advanced Directives  Does Patient Have a Medical Advance Directive? Yes Yes Yes No No  Type of [place] will Living will;Healthcare Power of Attorney    Does patient want to make changes to medical advance directive?   No - Patient declined    Copy of [place] Chart? No - copy requested No - copy requested No - copy requested    Would patient like information on creating a medical advance directive?    No - Patient declined No - Patient declined    Current Medications (verified) Outpatient Encounter Medications as of 02/21/2022  Medication Sig   amLODipine (NORVASC) 5 MG tablet Take 1 tablet (5 mg total) by mouth daily.   aspirin 325 MG tablet Take 325 mg by mouth daily.   atorvastatin (LIPITOR) 20 MG tablet TAKE 1 TABLET DAILY AT 6 P.M.   glimepiride (AMARYL) 4 MG tablet Take 1 tablet (4 mg total) by mouth daily with breakfast.   ibuprofen (ADVIL) 800 MG tablet Take 800 mg by mouth every 8 (eight) hours as needed.    linagliptin (TRADJENTA) 5 MG TABS tablet TAKE 1 TABLET DAILY FOR DIABETES   metoprolol succinate (TOPROL-XL) 25 MG 24 hr tablet Take 1 tablet (25 mg total) by mouth daily.   Multiple Vitamins-Minerals (MENS 50+ MULTI VITAMIN/MIN PO) Take 1 tablet by mouth daily.    tamsulosin (FLOMAX) 0.4 MG CAPS capsule TAKE 2 CAPSULES AT BEDTIME FOR URINE FLOW AND PROSTATE   No facility-administered encounter medications on file as of 02/21/2022.    Allergies (verified) Patient has no known allergies.   History: Past Medical History:  Diagnosis Date   CAD (coronary artery disease)    Diabetes mellitus    Hyperlipidemia    Hypertension    OA (osteoarthritis)    MULTIPLE SITES    Prostate cancer ([place]   HORMONE TREATMENT    Past Surgical History:  Procedure Laterality Date   CATARACT  SURGERY RIGHT EYE  4/10   CATARACT EXTRACTION W/PHACO Left 12/09/2016   Procedure: CATARACT EXTRACTION PHACO AND INTRAOCULAR LENS PLACEMENT LEFT EYE CDE=12.57;  Surgeon: WBaruch Goldmann MD;  Location: [facility];  Service: Ophthalmology;  Laterality: Left;  left   KNEE SURGERY Right    1965   TRIPLE BY PASS  1999   Family History  Problem Relation Age of Onset   Cancer Sister        lymphoma   Cancer Brother  lunch   Social History   Socioeconomic History   Marital status: Married    Spouse name: Not on file   Number of children: 3   Years of education: 16   Highest education level: Bachelor's degree (e.g., BA, AB, BS)  Occupational History   Occupation: Customer service manager    Comment: Retired   Occupation: Social research officer, government    Comment: Retired  Tobacco Use   Smoking status: Former    Packs/day: 1.00    Years: 10.00    Total pack years: 10.00    Types: Cigarettes    Quit date: 12/08/1962    Years since quitting: 59.2   Smokeless tobacco: Never  Vaping Use   Vaping Use: Never used  Substance and Sexual Activity   Alcohol use: Yes    Alcohol/week: 3.0 standard drinks of alcohol    Types: 3 Cans of beer  per week   Drug use: No   Sexual activity: Yes    Birth control/protection: None  Other Topics Concern   Not on file  Social History Narrative   Lives in 2 story home with handicap accessible bathroom with his wife who has Lewy Body Dementia   Social Determinants of Health   Financial Resource Strain: Low Risk  (02/21/2022)   Overall Financial Resource Strain (CARDIA)    Difficulty of Paying Living Expenses: Not hard at all  Food Insecurity: No Food Insecurity (02/21/2022)   Hunger Vital Sign    Worried About Running Out of Food in the Last Year: Never true    Ran Out of Food in the Last Year: Never true  Transportation Needs: No Transportation Needs (02/21/2022)   PRAPARE - Hydrologist (Medical): No    Lack of Transportation (Non-Medical): No  Physical Activity: Insufficiently Active (02/21/2022)   Exercise Vital Sign    Days of Exercise per Week: 3 days    Minutes of Exercise per Session: 30 min  Stress: No Stress Concern Present (02/21/2022)   [place]    Feeling of Stress : Not at all  Social Connections: [place] (02/21/2022)   Social Connection and Isolation Panel [NHANES]    Frequency of Communication with Friends and Family: More than three times a week    Frequency of Social Gatherings with Friends and Family: More than three times a week    Attends Religious Services: More than 4 times per year    Active Member of [employer] or Organizations: Yes    Attends Music therapist: More than 4 times per year    Marital Status: Married    Tobacco Counseling Counseling given: Not Answered   Clinical Intake:  Pre-visit preparation completed: Yes  Pain : No/denies pain     Nutritional Risks: None Diabetes: Yes CBG done?: No Did pt. bring in CBG monitor from home?: No  How often do you need to have someone help you when you read instructions, pamphlets,  or other written materials from your doctor or pharmacy?: 1 - Never  Diabetic?yes Nutrition Risk Assessment:  Has the patient had any N/V/D within the last 2 months?  No  Does the patient have any non-healing wounds?  No  Has the patient had any unintentional weight loss or weight gain?  No   Diabetes:  Is the patient diabetic?  Yes  If diabetic, was a CBG obtained today?  No  Did the patient bring in their glucometer from home?  No  How often do you monitor your CBG's? Never.   Financial Strains and Diabetes Management:  Are you having any financial strains with the device, your supplies or your medication? No .  Does the patient want to be seen by Chronic Care Management for management of their diabetes?  No  Would the patient like to be referred to a Nutritionist or for Diabetic Management?  No   Diabetic Exams:  Diabetic Eye Exam: Completed 02/2021 Diabetic Foot Exam: Overdue, Pt has been advised about the importance in completing this exam. Pt is scheduled for diabetic foot exam on next office visit .   Interpreter Needed?: No  Information entered by :: Jadene Pierini, LPN   Activities of Daily Living    02/21/2022    9:46 AM  In your present state of health, do you have any difficulty performing the following activities:  Hearing? 0  Vision? 0  Difficulty concentrating or making decisions? 0  Walking or climbing stairs? 0  Dressing or bathing? 0  Doing errands, shopping? 0  Preparing Food and eating ? N  Using the Toilet? N  In the past six months, have you accidently leaked urine? N  Do you have problems with loss of bowel control? N  Managing your Medications? N  Managing your Finances? N  Housekeeping or managing your Housekeeping? N    Patient Care Team: Claretta Fraise, MD as PCP - General (Family Medicine) Celestia Khat, OD (Optometry)  Indicate any recent Medical Services you may have received from other than [facility] providers in the past year (date may  be approximate).     Assessment:   This is a routine wellness examination for Sherill.  Hearing/Vision screen Vision Screening - Comments:: Wears rx glasses - up to date with routine eye exams with  Dr.Johnson   Dietary issues and exercise activities discussed: Current Exercise Habits: Home exercise routine, Type of exercise: walking, Time (Minutes): 30, Frequency (Times/Week): 3, Weekly Exercise (Minutes/Week): 90, Intensity: Mild, Exercise limited by: None identified   Goals Addressed             This Visit's Progress    Exercise 150 min/wk Moderate Activity   On track      Depression Screen    02/21/2022    9:43 AM 01/17/2022   10:37 AM 09/13/2021    1:38 PM 11/19/2020    2:36 PM 10/28/2020    8:25 AM 03/26/2020    2:34 PM 09/30/2019    2:27 PM  PHQ 2/9 Scores  PHQ - 2 Score 0 0 0 0 0 0 0    Fall Risk    02/21/2022    9:42 AM 01/17/2022   10:40 AM 01/17/2022   10:37 AM 09/13/2021    1:38 PM 11/19/2020    2:36 PM  [place] in the past year? 0 1 0 0 0  Number falls in past yr: 0 0     Injury with Fall? 0 0     Risk for fall due to : No Fall Risks History of fall(s)     Follow up Falls prevention discussed Falls evaluation completed       [place]:  Any stairs in or around the home? Yes  If so, are there any without handrails? No  Home free of loose throw rugs in walkways, pet beds, electrical cords, etc? Yes  Adequate lighting in your home to reduce risk of falls? Yes   ASSISTIVE DEVICES  UTILIZED TO PREVENT FALLS:  Life alert? No  Use of a cane, walker or w/c? No  Grab bars in the bathroom? No  Shower chair or bench in shower? No  Elevated toilet seat or a handicapped toilet? No       04/20/2017    2:21 PM  MMSE - Mini Mental State Exam  Orientation to time 5  Orientation to Place 5  Registration 3  Attention/ Calculation 4  Recall 3  Language- name 2 objects 2  Language- repeat 1  Language- follow 3 step  command 3  Language- read & follow direction 1  Write a sentence 1  Copy design 1  Total score 29        02/21/2022    9:46 AM  6CIT Screen  What Year? 0 points  What month? 0 points  What time? 0 points  Count back from 20 0 points  Months in reverse 0 points  Repeat phrase 0 points  Total Score 0 points    Immunizations Immunization History  Administered Date(s) Administered   COVID-19, mRNA, vaccine(Comirnaty)12 years and older 11/16/2021   Fluad Quad(high Dose 65+) 10/18/2018, 11/13/2019, 10/22/2020, 11/16/2021   Influenza Split 11/15/2006, 11/02/2007, 11/20/2019   Influenza, High Dose Seasonal PF 11/24/2014, 11/11/2016, 11/15/2017   Influenza-Unspecified 11/15/2006, 11/02/2007, 12/08/2012, 11/07/2013   Moderna Covid-19 Vaccine Bivalent Booster 110yr & up 11/10/2020, 06/29/2021   Moderna SARS-COV2 Booster Vaccination 01/08/2020, 06/11/2020   Moderna Sars-Covid-2 Vaccination 03/21/2019, 04/19/2019   Pneumococcal Polysaccharide-23 11/15/2006, 11/12/2015    TDAP status: Due, Education has been provided regarding the importance of this vaccine. Advised may receive this vaccine at local pharmacy or Health Dept. Aware to provide a copy of the vaccination record if obtained from local pharmacy or Health Dept. Verbalized acceptance and understanding.  Flu Vaccine status: Up to date  Pneumococcal vaccine status: Up to date  Covid-19 vaccine status: Completed vaccines  Qualifies for Shingles Vaccine? Yes   Zostavax completed No   Shingrix Completed?: No.    Education has been provided regarding the importance of this vaccine. Patient has been advised to call insurance company to determine out of pocket expense if they have not yet received this vaccine. Advised may also receive vaccine at local pharmacy or Health Dept. Verbalized acceptance and understanding.  Screening Tests Health Maintenance  Topic Date Due   DTaP/Tdap/Td (1 - Tdap) Never done   OPHTHALMOLOGY EXAM   11/12/2019   FOOT EXAM  11/19/2021   COVID-19 Vaccine (6 - 2023-24 season) 01/11/2022   Zoster Vaccines- Shingrix (1 of 2) 04/18/2022 (Originally 08/15/1945)   Pneumonia Vaccine 87 Years old (2 - PCV) 09/14/2022 (Originally 11/11/2016)   HEMOGLOBIN A1C  07/19/2022   Medicare Annual Wellness (AWV)  02/22/2023   INFLUENZA VACCINE  Completed   HPV VACCINES  Aged Out    Health Maintenance  Health Maintenance Due  Topic Date Due   DTaP/Tdap/Td (1 - Tdap) Never done   OPHTHALMOLOGY EXAM  11/12/2019   FOOT EXAM  11/19/2021   COVID-19 Vaccine (6 - 2023-24 season) 01/11/2022    Colorectal cancer screening: No longer required.   Lung Cancer Screening: (Low Dose CT Chest recommended if Age 87-80years, 30 pack-year currently smoking OR have quit w/in 15years.) does not qualify.   Lung Cancer Screening Referral: n/a  Additional Screening:  Hepatitis C Screening: does not qualify;   Vision Screening: Recommended annual ophthalmology exams for early detection of glaucoma and other disorders of the eye. Is the patient up to  date with their annual eye exam?  Yes  Who is the provider or what is the name of the office in which the patient attends annual eye exams? Dr.Johnson  If pt is not established with a provider, would they like to be referred to a provider to establish care? No .   Dental Screening: Recommended annual dental exams for proper oral hygiene  Community Resource Referral / Chronic Care Management: CRR required this visit?  No   CCM required this visit?  No      Plan:     I have personally reviewed and noted the following in the patient's chart:   Medical and social history Use of alcohol, tobacco or illicit drugs  Current medications and supplements including opioid prescriptions. Patient is not currently taking opioid prescriptions. Functional ability and status Nutritional status Physical activity Advanced directives List of other physicians Hospitalizations,  surgeries, and ER visits in previous 12 months Vitals Screenings to include cognitive, depression, and falls Referrals and appointments  In addition, I have reviewed and discussed with patient certain preventive protocols, quality metrics, and best practice recommendations. A written personalized care plan for preventive services as well as general preventive health recommendations were provided to patient.     Daphane Shepherd, LPN   [id number]   Nurse Notes: Due TDAP vaccines

## 2022-05-03 ENCOUNTER — Telehealth: Payer: Self-pay | Admitting: Family Medicine

## 2022-05-03 NOTE — Telephone Encounter (Signed)
Contacted Radene Journey. to schedule their annual wellness visit. Appointment made for 02/23/2023.  Thank you,  Colletta [place],  [place] [facility] Direct Dial ??[phone]

## 2022-06-20 ENCOUNTER — Ambulatory Visit (INDEPENDENT_AMBULATORY_CARE_PROVIDER_SITE_OTHER): Payer: Medicare Other | Admitting: Family Medicine

## 2022-06-20 ENCOUNTER — Encounter: Payer: Self-pay | Admitting: Family Medicine

## 2022-06-20 VITALS — BP 170/65 | HR 63 | Temp 97.3°F | Ht 69.0 in | Wt 181.4 lb

## 2022-06-20 DIAGNOSIS — E782 Mixed hyperlipidemia: Secondary | ICD-10-CM

## 2022-06-20 DIAGNOSIS — N183 Chronic kidney disease, stage 3 unspecified: Secondary | ICD-10-CM

## 2022-06-20 DIAGNOSIS — I10 Essential (primary) hypertension: Secondary | ICD-10-CM | POA: Diagnosis not present

## 2022-06-20 DIAGNOSIS — E1122 Type 2 diabetes mellitus with diabetic chronic kidney disease: Secondary | ICD-10-CM | POA: Diagnosis not present

## 2022-06-20 LAB — BAYER DCA HB A1C WAIVED: HB A1C (BAYER DCA - WAIVED): 6.2 % — ABNORMAL HIGH (ref 4.8–5.6)

## 2022-06-20 MED ORDER — METOPROLOL SUCCINATE ER 50 MG PO TB24: 50.0000 mg | ORAL_TABLET | Freq: Every day | ORAL | 1 refills | Status: DC

## 2022-06-20 NOTE — Progress Notes (Signed)
Subjective:  Patient ID: Victor Tapia., male    DOB: 11/10/1926  Age: 89 y.o. MRN: [id number]  CC: Medical Management of Chronic Issues   HPI Victor Tapia. presents forFollow-up of diabetes. Patient doesn't check blood sugar at home.  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 Patient denies symptoms such as polyuria, polydipsia, excessive hunger, nausea No significant hypoglycemic spells noted. Medications reviewed. Pt reports taking them regularly without complication/adverse reaction being reported today.    History Victor Tapia has a past medical history of CAD (coronary artery disease), Diabetes mellitus, Hyperlipidemia, Hypertension, OA (osteoarthritis), and Prostate cancer (HCC) (1999).   Victor Tapia has a past surgical history that includes TRIPLE BY PASS (1999); CATARACT  SURGERY RIGHT EYE (4/10); Knee surgery (Right); and Cataract extraction w/PHACO (Left, 12/09/2016).   His family history includes Cancer in his brother and sister.Victor Tapia reports that Victor Tapia about 59 years ago. His Tapia use included cigarettes. Victor Tapia has a 10.00 pack-year Tapia history. Victor Tapia has never used smokeless tobacco. Victor Tapia reports current alcohol use of about 3.0 standard drinks of alcohol per week. Victor Tapia reports that Victor Tapia does not use drugs.  Current Outpatient Medications on File Prior to Visit  Medication Sig Dispense Refill   amLODipine (NORVASC) 5 MG tablet Take 1 tablet (5 mg total) by mouth daily. 90 tablet 3   aspirin 325 MG tablet Take 325 mg by mouth daily.     atorvastatin (LIPITOR) 20 MG tablet TAKE 1 TABLET DAILY AT 6 P.M. 90 tablet 3   glimepiride (AMARYL) 4 MG tablet Take 1 tablet (4 mg total) by mouth daily with breakfast. 90 tablet 2   ibuprofen (ADVIL) 800 MG tablet Take 800 mg by mouth every 8 (eight) hours as needed.     linagliptin (TRADJENTA) 5 MG TABS tablet TAKE 1 TABLET DAILY FOR DIABETES 90 tablet 3   Multiple Vitamins-Minerals (MENS 50+ MULTI VITAMIN/MIN PO) Take 1 tablet by mouth  daily.      tamsulosin (FLOMAX) 0.4 MG CAPS capsule TAKE 2 CAPSULES AT BEDTIME FOR URINE FLOW AND PROSTATE 180 capsule 3   No current facility-administered medications on file prior to visit.    ROS Review of Systems  Constitutional:  Negative for fever.  Respiratory:  Negative for shortness of breath.   Cardiovascular:  Negative for chest pain.  Musculoskeletal:  Negative for arthralgias.  Skin:  Negative for rash.    Objective:  BP (!) 170/65   Pulse 63   Temp (!) 97.3 F (36.3 C)   Ht 5\' 9"  (1.753 m)   Wt 181 lb 6.4 oz (82.3 kg)   SpO2 98%   BMI 26.79 kg/m   BP Readings from Last 3 Encounters:  06/20/22 (!) 170/65  01/17/22 (!) 159/57  09/13/21 (!) 156/56    Wt Readings from Last 3 Encounters:  06/20/22 181 lb 6.4 oz (82.3 kg)  02/21/22 190 lb (86.2 kg)  01/17/22 187 lb 3.2 oz (84.9 kg)     Physical Exam Vitals reviewed.  Constitutional:      Appearance: Victor Tapia is well-developed.  HENT:     Head: Normocephalic and atraumatic.     Right Ear: External ear normal.     Left Ear: External ear normal.     Mouth/Throat:     Pharynx: No oropharyngeal exudate or posterior oropharyngeal erythema.  Eyes:     Pupils: Pupils are equal, round, and reactive to light.  Cardiovascular:     Rate and Rhythm: Normal rate and regular rhythm.  Heart sounds: No murmur heard. Pulmonary:     Effort: No respiratory distress.     Breath sounds: Normal breath sounds.  Musculoskeletal:     Cervical back: Normal range of motion and neck supple.  Neurological:     Mental Status: Victor Tapia is alert and oriented to person, place, and time.       Assessment & Plan:   Victor Tapia was seen today for medical management of chronic issues.  Diagnoses and all orders for this visit:  Type 2 diabetes mellitus with stage 3 chronic kidney disease, without long-term current use of insulin, unspecified whether stage 3a or 3b CKD (HCC) -     Bayer DCA Hb A1c Waived  Essential hypertension,  benign -     Lipid panel -     metoprolol succinate (TOPROL-XL) 50 MG 24 hr tablet; Take 1 tablet (50 mg total) by mouth daily. For high blood pressure  Mixed hyperlipidemia -     CBC with Differential/Platelet -     CMP14+EGFR      I have changed Victor Silver Jr.'s metoprolol succinate. I am also having him maintain his aspirin, Multiple Vitamins-Minerals (MENS 50+ MULTI VITAMIN/MIN PO), ibuprofen, atorvastatin, glimepiride, linagliptin, tamsulosin, and amLODipine.  Meds ordered this encounter  Medications   metoprolol succinate (TOPROL-XL) 50 MG 24 hr tablet    Sig: Take 1 tablet (50 mg total) by mouth daily. For high blood pressure    Dispense:  90 tablet    Refill:  1     Follow-up: Return in about 3 months (around 09/20/2022).  Mechele Claude, M.D.

## 2022-06-21 LAB — CMP14+EGFR
ALT: 10 IU/L (ref 0–44)
AST: 16 IU/L (ref 0–40)
Albumin/Globulin Ratio: 1.4 (ref 1.2–2.2)
Albumin: 3.9 g/dL (ref 3.6–4.6)
Alkaline Phosphatase: 100 IU/L (ref 44–121)
BUN/Creatinine Ratio: 15 (ref 10–24)
BUN: 19 mg/dL (ref 10–36)
Bilirubin Total: 0.3 mg/dL (ref 0.0–1.2)
CO2: 22 mmol/L (ref 20–29)
Calcium: 9 mg/dL (ref 8.6–10.2)
Chloride: 107 mmol/L — ABNORMAL HIGH (ref 96–106)
Creatinine, Ser: 1.28 mg/dL — ABNORMAL HIGH (ref 0.76–1.27)
Globulin, Total: 2.8 g/dL (ref 1.5–4.5)
Glucose: 110 mg/dL — ABNORMAL HIGH (ref 70–99)
Potassium: 4.5 mmol/L (ref 3.5–5.2)
Sodium: 142 mmol/L (ref 134–144)
Total Protein: 6.7 g/dL (ref 6.0–8.5)
eGFR: 52 mL/min/{1.73_m2} — ABNORMAL LOW (ref >59)

## 2022-06-21 LAB — LIPID PANEL
Chol/HDL Ratio: 1.9 ratio (ref 0.0–5.0)
Cholesterol, Total: 108 mg/dL (ref 100–199)
HDL: 58 mg/dL (ref >39)
LDL Chol Calc (NIH): 37 mg/dL (ref 0–99)
Triglycerides: 57 mg/dL (ref 0–149)
VLDL Cholesterol Cal: 13 mg/dL (ref 5–40)

## 2022-06-21 LAB — CBC WITH DIFFERENTIAL/PLATELET
Basophils Absolute: 0 10*3/uL (ref 0.0–0.2)
Basos: 0 %
EOS (ABSOLUTE): 0.1 10*3/uL (ref 0.0–0.4)
Eos: 1 %
Hematocrit: 36.1 % — ABNORMAL LOW (ref 37.5–51.0)
Hemoglobin: 11.9 g/dL — ABNORMAL LOW (ref 13.0–17.7)
Immature Grans (Abs): 0 10*3/uL (ref 0.0–0.1)
Immature Granulocytes: 0 %
Lymphocytes Absolute: 2.5 10*3/uL (ref 0.7–3.1)
Lymphs: 31 %
MCH: 30.9 pg (ref 26.6–33.0)
MCHC: 33 g/dL (ref 31.5–35.7)
MCV: 94 fL (ref 79–97)
Monocytes Absolute: 0.6 10*3/uL (ref 0.1–0.9)
Monocytes: 7 %
Neutrophils Absolute: 4.9 10*3/uL (ref 1.4–7.0)
Neutrophils: 61 %
Platelets: 170 10*3/uL (ref 150–450)
RBC: 3.85 x10E6/uL — ABNORMAL LOW (ref 4.14–5.80)
RDW: 12.6 % (ref 11.6–15.4)
WBC: 8.1 10*3/uL (ref 3.4–10.8)

## 2022-06-21 NOTE — Progress Notes (Signed)
Hello Hendrick, ° °Your lab result is normal and/or stable.Some minor variations that are not significant are commonly marked abnormal, but do not represent any medical problem for you. ° °Best regards, °Teja Judice, M.D.

## 2022-07-01 ENCOUNTER — Ambulatory Visit (INDEPENDENT_AMBULATORY_CARE_PROVIDER_SITE_OTHER): Payer: Medicare Other | Admitting: Family Medicine

## 2022-07-01 ENCOUNTER — Encounter: Payer: Self-pay | Admitting: Family Medicine

## 2022-07-01 VITALS — BP 156/61 | HR 67 | Ht 69.0 in | Wt 179.0 lb

## 2022-07-01 DIAGNOSIS — J029 Acute pharyngitis, unspecified: Secondary | ICD-10-CM | POA: Diagnosis not present

## 2022-07-01 MED ORDER — PREDNISONE 20 MG PO TABS
ORAL_TABLET | ORAL | 0 refills | Status: DC
Start: 2022-07-01 — End: 2022-11-01

## 2022-07-01 NOTE — Progress Notes (Signed)
BP (!) 156/61   Pulse 67   Ht 5\' 9"  (1.753 m)   Wt 179 lb (81.2 kg)   SpO2 99%   BMI 26.43 kg/m    Subjective:   Patient ID: Victor Tapia., male    DOB: 07/19/1926, 89 y.o.   MRN: [id number]  HPI: Victor Tapia. is a 89 y.o. male presenting on 07/01/2022 for Discomfort when swallowing   HPI Patient comes in today with complaints of a few different things that have all started over the past 4 to 5 days.  He says that he has been having some right-sided swallowing discomfort and feeling hoarse and has a little bit of cough that is somewhat productive of phlegm and then he has also been having some numbness down his right arm and some tingling around his right nipple.  He says this all started over the past 4 to 5 days.  He cannot pinpoint any reason why it started, he has not done anything different.  He denies any fevers or chills or shortness of breath or wheezing.  He denies any sick contacts that he knows of.  He says that the tingling and numbness and the right side dysphagia are intermittent and do not happen all the time over the past 5 days but just some of the time.  Relevant past medical, surgical, family and social history reviewed and updated as indicated. Interim medical history since our last visit reviewed. Allergies and medications reviewed and updated.  Review of Systems  Constitutional:  Negative for chills and fever.  HENT:  Positive for congestion, sore throat, trouble swallowing and voice change.   Respiratory:  Negative for shortness of breath and wheezing.   Cardiovascular:  Negative for chest pain and leg swelling.  Musculoskeletal:  Negative for arthralgias, back pain and gait problem.  Skin:  Negative for rash.  Neurological:  Positive for numbness. Negative for weakness and light-headedness.  All other systems reviewed and are negative.   Per HPI unless specifically indicated above   Allergies as of 07/01/2022   No Known Allergies      Medication  List        Accurate as of Jul 01, 2022  9:57 AM. If you have any questions, ask your nurse or doctor.          amLODipine 5 MG tablet Commonly known as: NORVASC Take 1 tablet (5 mg total) by mouth daily.   aspirin 325 MG tablet Take 325 mg by mouth daily.   atorvastatin 20 MG tablet Commonly known as: LIPITOR TAKE 1 TABLET DAILY AT 6 P.M.   glimepiride 4 MG tablet Commonly known as: AMARYL Take 1 tablet (4 mg total) by mouth daily with breakfast.   ibuprofen 800 MG tablet Commonly known as: ADVIL Take 800 mg by mouth every 8 (eight) hours as needed.   linagliptin 5 MG Tabs tablet Commonly known as: Tradjenta TAKE 1 TABLET DAILY FOR DIABETES   MENS 50+ MULTI VITAMIN/MIN PO Take 1 tablet by mouth daily.   metoprolol succinate 50 MG 24 hr tablet Commonly known as: TOPROL-XL Take 1 tablet (50 mg total) by mouth daily. For high blood pressure   predniSONE 20 MG tablet Commonly known as: DELTASONE 2 po at same time daily for 5 days Started by: Elige Radon Danicia Terhaar, MD   tamsulosin 0.4 MG Caps capsule Commonly known as: FLOMAX TAKE 2 CAPSULES AT BEDTIME FOR URINE FLOW AND PROSTATE         Objective:  BP (!) 156/61   Pulse 67   Ht 5\' 9"  (1.753 m)   Wt 179 lb (81.2 kg)   SpO2 99%   BMI 26.43 kg/m   Wt Readings from Last 3 Encounters:  07/01/22 179 lb (81.2 kg)  06/20/22 181 lb 6.4 oz (82.3 kg)  02/21/22 190 lb (86.2 kg)    Physical Exam Vitals and nursing note reviewed.  Constitutional:      General: He is not in acute distress.    Appearance: He is well-developed. He is not diaphoretic.  Eyes:     General: No scleral icterus.    Conjunctiva/sclera: Conjunctivae normal.  Neck:     Thyroid: No thyromegaly.  Cardiovascular:     Rate and Rhythm: Normal rate and regular rhythm.     Heart sounds: Normal heart sounds. No murmur heard. Pulmonary:     Effort: Pulmonary effort is normal. No respiratory distress.     Breath sounds: Normal breath sounds.  No wheezing.  Musculoskeletal:        General: No swelling. Normal range of motion.     Cervical back: Neck supple.  Lymphadenopathy:     Cervical: No cervical adenopathy.  Skin:    General: Skin is warm and dry.     Findings: No rash.  Neurological:     Mental Status: He is alert and oriented to person, place, and time.     Coordination: Coordination normal.  Psychiatric:        Behavior: Behavior normal.       Assessment & Plan:   Problem List Items Addressed This Visit   None Visit Diagnoses     Pharyngitis, unspecified etiology    -  Primary   Relevant Medications   predniSONE (DELTASONE) 20 MG tablet     Likely a viral syndrome but also concern for possible neuropathy.  Will give short course of prednisone, instructed him to watch his sugars very closely.  Follow up plan: Return if symptoms worsen or fail to improve.  Counseling provided for all of the vaccine components No orders of the defined types were placed in this encounter.   Arville Care, MD [facility] 07/01/2022, 9:57 AM

## 2022-09-21 ENCOUNTER — Encounter: Payer: Self-pay | Admitting: Family Medicine

## 2022-09-21 ENCOUNTER — Ambulatory Visit: Payer: Medicare Other | Admitting: Family Medicine

## 2022-09-21 VITALS — BP 135/57 | HR 66 | Temp 97.5°F | Ht 69.0 in | Wt 174.8 lb

## 2022-09-21 DIAGNOSIS — E782 Mixed hyperlipidemia: Secondary | ICD-10-CM | POA: Diagnosis not present

## 2022-09-21 DIAGNOSIS — R350 Frequency of micturition: Secondary | ICD-10-CM | POA: Diagnosis not present

## 2022-09-21 DIAGNOSIS — I10 Essential (primary) hypertension: Secondary | ICD-10-CM

## 2022-09-21 DIAGNOSIS — N401 Enlarged prostate with lower urinary tract symptoms: Secondary | ICD-10-CM | POA: Diagnosis not present

## 2022-09-21 DIAGNOSIS — E1122 Type 2 diabetes mellitus with diabetic chronic kidney disease: Secondary | ICD-10-CM

## 2022-09-21 DIAGNOSIS — N183 Chronic kidney disease, stage 3 unspecified: Secondary | ICD-10-CM | POA: Diagnosis not present

## 2022-09-21 LAB — BAYER DCA HB A1C WAIVED: HB A1C (BAYER DCA - WAIVED): 6 % — ABNORMAL HIGH (ref 4.8–5.6)

## 2022-09-21 MED ORDER — METOPROLOL SUCCINATE ER 50 MG PO TB24
50.0000 mg | ORAL_TABLET | Freq: Every day | ORAL | 3 refills | Status: DC
Start: 2022-09-21 — End: 2022-11-01

## 2022-09-21 MED ORDER — CIPROFLOXACIN HCL 500 MG PO TABS: 500.0000 mg | ORAL_TABLET | Freq: Two times a day (BID) | ORAL | 0 refills | Status: DC

## 2022-09-21 MED ORDER — LINAGLIPTIN 5 MG PO TABS
ORAL_TABLET | ORAL | 3 refills | Status: DC
Start: 2022-09-21 — End: 2022-11-01

## 2022-09-21 MED ORDER — ATORVASTATIN CALCIUM 20 MG PO TABS: ORAL_TABLET | ORAL | 3 refills | Status: DC

## 2022-09-21 MED ORDER — GLIMEPIRIDE 4 MG PO TABS: 4.0000 mg | ORAL_TABLET | Freq: Every day | ORAL | 3 refills | Status: DC

## 2022-09-21 MED ORDER — TAMSULOSIN HCL 0.4 MG PO CAPS
ORAL_CAPSULE | ORAL | 3 refills | Status: DC
Start: 2022-09-21 — End: 2022-11-01

## 2022-09-21 NOTE — Progress Notes (Signed)
Subjective:  Patient ID: Victor Messing., male    DOB: Feb 09, 1926  Age: 89 y.o. MRN: [id number]  CC: Medical Management of Chronic Issues   HPI Victor Whitted. presents forFollow-up of diabetes. Says he is too old to check his blood sugar.Patient denies symptoms such as polyuria, polydipsia, excessive hunger, nausea No significant hypoglycemic spells noted. Medications reviewed. Pt reports taking them regularly without complication/adverse reaction being reported today.   Wife passed away on 09-12-2022. Feeling depressed. Died of Lewy Body Dementia. Had Hospice. Getting everything in order now.   Brother has bacterial and fungal meningitis. Pt. Saw him on 8/2 & 8/5.      09/21/2022    1:43 PM 07/01/2022    9:43 AM 06/20/2022    2:26 PM 02/21/2022    9:43 AM 01/17/2022   10:37 AM  Depression screen PHQ 2/9  Decreased Interest 0 0 0 0 0  Down, Depressed, Hopeless 0 0 0 0 0  PHQ - 2 Score 0 0 0 0 0  Altered sleeping  0     Tired, decreased energy  0     Change in appetite  0     Feeling bad or failure about yourself   0     Trouble concentrating  0     Moving slowly or fidgety/restless  0     Suicidal thoughts  0     PHQ-9 Score  0     Difficult doing work/chores  Not difficult at all        History Victor Tapia has a past medical history of CAD (coronary artery disease), Diabetes mellitus, Hyperlipidemia, Hypertension, OA (osteoarthritis), and Prostate cancer (HCC) (1999).   He has a past surgical history that includes TRIPLE BY PASS (1999); CATARACT  SURGERY RIGHT EYE (4/10); Knee surgery (Right); and Cataract extraction w/PHACO (Left, 12/09/2016).   His family history includes Cancer in his brother and sister.He reports that he quit smoking about 59 years ago. His smoking use included cigarettes. He started smoking about 69 years ago. He has a 10 pack-year smoking history. He has never used smokeless tobacco. He reports current alcohol use of about 3.0 standard drinks of alcohol per  week. He reports that he does not use drugs.  Current Outpatient Medications on File Prior to Visit  Medication Sig Dispense Refill   amLODipine (NORVASC) 5 MG tablet Take 1 tablet (5 mg total) by mouth daily. 90 tablet 3   aspirin 325 MG tablet Take 325 mg by mouth daily.     ibuprofen (ADVIL) 800 MG tablet Take 800 mg by mouth every 8 (eight) hours as needed.     Multiple Vitamins-Minerals (MENS 50+ MULTI VITAMIN/MIN PO) Take 1 tablet by mouth daily.      predniSONE (DELTASONE) 20 MG tablet 2 po at same time daily for 5 days 10 tablet 0   No current facility-administered medications on file prior to visit.    ROS Review of Systems  Constitutional:  Negative for fever.  Respiratory:  Negative for shortness of breath.   Cardiovascular:  Negative for chest pain.  Musculoskeletal:  Negative for arthralgias.  Skin:  Negative for rash.    Objective:  BP (!) 135/57   Pulse 66   Temp (!) 97.5 F (36.4 C)   Ht 5\' 9"  (1.753 m)   Wt 174 lb 12.8 oz (79.3 kg)   SpO2 95%   BMI 25.81 kg/m   BP Readings from Last 3 Encounters:  09/21/22 (!) 135/57  07/01/22 (!) 156/61  06/20/22 (!) 170/65    Wt Readings from Last 3 Encounters:  09/21/22 174 lb 12.8 oz (79.3 kg)  07/01/22 179 lb (81.2 kg)  06/20/22 181 lb 6.4 oz (82.3 kg)     Physical Exam Vitals reviewed.  Constitutional:      Appearance: He is well-developed.  HENT:     Head: Normocephalic and atraumatic.     Right Ear: External ear normal.     Left Ear: External ear normal.     Mouth/Throat:     Pharynx: No oropharyngeal exudate or posterior oropharyngeal erythema.  Eyes:     Pupils: Pupils are equal, round, and reactive to light.  Cardiovascular:     Rate and Rhythm: Normal rate and regular rhythm.     Heart sounds: No murmur heard. Pulmonary:     Effort: No respiratory distress.     Breath sounds: Normal breath sounds.  Musculoskeletal:     Cervical back: Normal range of motion and neck supple.  Neurological:      Mental Status: He is alert and oriented to person, place, and time.       Assessment & Plan:   Victor Tapia was seen today for medical management of chronic issues.  Diagnoses and all orders for this visit:  Type 2 diabetes mellitus with stage 3 chronic kidney disease, without long-term current use of insulin, unspecified whether stage 3a or 3b CKD (HCC) -     Bayer DCA Hb A1c Waived -     atorvastatin (LIPITOR) 20 MG tablet; TAKE 1 TABLET DAILY AT 6 P.M. -     glimepiride (AMARYL) 4 MG tablet; Take 1 tablet (4 mg total) by mouth daily with breakfast. -     linagliptin (TRADJENTA) 5 MG TABS tablet; TAKE 1 TABLET DAILY FOR DIABETES  Essential hypertension, benign -     CBC with Differential/Platelet -     CMP14+EGFR -     metoprolol succinate (TOPROL-XL) 50 MG 24 hr tablet; Take 1 tablet (50 mg total) by mouth daily. For high blood pressure  Mixed hyperlipidemia -     Lipid panel -     atorvastatin (LIPITOR) 20 MG tablet; TAKE 1 TABLET DAILY AT 6 P.M.  Benign prostatic hyperplasia with urinary frequency -     tamsulosin (FLOMAX) 0.4 MG CAPS capsule; TAKE 2 CAPSULES AT BEDTIME FOR URINE FLOW AND PROSTATE  Other orders -     ciprofloxacin (CIPRO) 500 MG tablet; Take 1 tablet (500 mg total) by mouth 2 (two) times daily.      I am having Victor Messing. start on ciprofloxacin. I am also having him maintain his aspirin, Multiple Vitamins-Minerals (MENS 50+ MULTI VITAMIN/MIN PO), ibuprofen, amLODipine, predniSONE, atorvastatin, glimepiride, linagliptin, metoprolol succinate, and tamsulosin.  Meds ordered this encounter  Medications   atorvastatin (LIPITOR) 20 MG tablet    Sig: TAKE 1 TABLET DAILY AT 6 P.M.    Dispense:  90 tablet    Refill:  3   glimepiride (AMARYL) 4 MG tablet    Sig: Take 1 tablet (4 mg total) by mouth daily with breakfast.    Dispense:  90 tablet    Refill:  3   linagliptin (TRADJENTA) 5 MG TABS tablet    Sig: TAKE 1 TABLET DAILY FOR DIABETES     Dispense:  90 tablet    Refill:  3   metoprolol succinate (TOPROL-XL) 50 MG 24 hr tablet    Sig: Take 1 tablet (50 mg total) by  mouth daily. For high blood pressure    Dispense:  90 tablet    Refill:  3   tamsulosin (FLOMAX) 0.4 MG CAPS capsule    Sig: TAKE 2 CAPSULES AT BEDTIME FOR URINE FLOW AND PROSTATE    Dispense:  180 capsule    Refill:  3   ciprofloxacin (CIPRO) 500 MG tablet    Sig: Take 1 tablet (500 mg total) by mouth 2 (two) times daily.    Dispense:  14 tablet    Refill:  0     Follow-up: No follow-ups on file.  Mechele Claude, M.D.

## 2022-09-22 ENCOUNTER — Other Ambulatory Visit: Payer: Self-pay | Admitting: *Deleted

## 2022-09-22 DIAGNOSIS — D649 Anemia, unspecified: Secondary | ICD-10-CM

## 2022-09-22 LAB — CBC WITH DIFFERENTIAL/PLATELET
Basophils Absolute: 0 10*3/uL (ref 0.0–0.2)
Basos: 0 %
EOS (ABSOLUTE): 0.1 10*3/uL (ref 0.0–0.4)
Eos: 1 %
Hematocrit: 34.2 % — ABNORMAL LOW (ref 37.5–51.0)
Hemoglobin: 10.9 g/dL — ABNORMAL LOW (ref 13.0–17.7)
Immature Grans (Abs): 0 10*3/uL (ref 0.0–0.1)
Immature Granulocytes: 0 %
Lymphocytes Absolute: 2.3 10*3/uL (ref 0.7–3.1)
Lymphs: 29 %
MCH: 30.9 pg (ref 26.6–33.0)
MCHC: 31.9 g/dL (ref 31.5–35.7)
MCV: 97 fL (ref 79–97)
Monocytes Absolute: 0.6 10*3/uL (ref 0.1–0.9)
Monocytes: 8 %
Neutrophils Absolute: 5.1 10*3/uL (ref 1.4–7.0)
Neutrophils: 62 %
Platelets: 180 10*3/uL (ref 150–450)
RBC: 3.53 x10E6/uL — ABNORMAL LOW (ref 4.14–5.80)
RDW: 13 % (ref 11.6–15.4)
WBC: 8.2 10*3/uL (ref 3.4–10.8)

## 2022-09-22 LAB — CMP14+EGFR
ALT: 11 IU/L (ref 0–44)
AST: 17 IU/L (ref 0–40)
Albumin: 3.5 g/dL — ABNORMAL LOW (ref 3.6–4.6)
Alkaline Phosphatase: 95 IU/L (ref 44–121)
BUN/Creatinine Ratio: 18 (ref 10–24)
BUN: 25 mg/dL (ref 10–36)
Bilirubin Total: 0.2 mg/dL (ref 0.0–1.2)
CO2: 25 mmol/L (ref 20–29)
Calcium: 8.8 mg/dL (ref 8.6–10.2)
Chloride: 102 mmol/L (ref 96–106)
Creatinine, Ser: 1.4 mg/dL — ABNORMAL HIGH (ref 0.76–1.27)
Globulin, Total: 2.9 g/dL (ref 1.5–4.5)
Glucose: 202 mg/dL — ABNORMAL HIGH (ref 70–99)
Potassium: 5.2 mmol/L (ref 3.5–5.2)
Sodium: 139 mmol/L (ref 134–144)
Total Protein: 6.4 g/dL (ref 6.0–8.5)
eGFR: 47 mL/min/{1.73_m2} — ABNORMAL LOW (ref >59)

## 2022-09-22 LAB — LIPID PANEL
Chol/HDL Ratio: 2.3 ratio (ref 0.0–5.0)
Cholesterol, Total: 128 mg/dL (ref 100–199)
HDL: 56 mg/dL (ref >39)
LDL Chol Calc (NIH): 55 mg/dL (ref 0–99)
Triglycerides: 86 mg/dL (ref 0–149)
VLDL Cholesterol Cal: 17 mg/dL (ref 5–40)

## 2022-09-23 ENCOUNTER — Other Ambulatory Visit: Payer: Medicare Other

## 2022-10-04 ENCOUNTER — Other Ambulatory Visit: Payer: Medicare Other

## 2022-10-04 DIAGNOSIS — D649 Anemia, unspecified: Secondary | ICD-10-CM

## 2022-10-05 LAB — FECAL OCCULT BLOOD, IMMUNOCHEMICAL: Fecal Occult Bld: NEGATIVE

## 2022-10-24 ENCOUNTER — Emergency Department (HOSPITAL_COMMUNITY): Payer: Medicare Other

## 2022-10-24 ENCOUNTER — Encounter (HOSPITAL_COMMUNITY): Payer: Self-pay

## 2022-10-24 ENCOUNTER — Inpatient Hospital Stay (HOSPITAL_COMMUNITY)
Admission: EM | Admit: 2022-10-24 | Discharge: 2022-11-08 | DRG: 871 | Disposition: E | Payer: Medicare Other | Attending: Student | Admitting: Student

## 2022-10-24 ENCOUNTER — Other Ambulatory Visit: Payer: Self-pay

## 2022-10-24 DIAGNOSIS — R0902 Hypoxemia: Secondary | ICD-10-CM | POA: Diagnosis present

## 2022-10-24 DIAGNOSIS — I10 Essential (primary) hypertension: Secondary | ICD-10-CM | POA: Diagnosis not present

## 2022-10-24 DIAGNOSIS — J90 Pleural effusion, not elsewhere classified: Secondary | ICD-10-CM | POA: Diagnosis not present

## 2022-10-24 DIAGNOSIS — Z87891 Personal history of nicotine dependence: Secondary | ICD-10-CM

## 2022-10-24 DIAGNOSIS — E860 Dehydration: Secondary | ICD-10-CM | POA: Diagnosis present

## 2022-10-24 DIAGNOSIS — L89010 Pressure ulcer of right elbow, unstageable: Secondary | ICD-10-CM | POA: Diagnosis present

## 2022-10-24 DIAGNOSIS — E785 Hyperlipidemia, unspecified: Secondary | ICD-10-CM | POA: Diagnosis not present

## 2022-10-24 DIAGNOSIS — M1990 Unspecified osteoarthritis, unspecified site: Secondary | ICD-10-CM | POA: Diagnosis present

## 2022-10-24 DIAGNOSIS — E1122 Type 2 diabetes mellitus with diabetic chronic kidney disease: Secondary | ICD-10-CM | POA: Diagnosis not present

## 2022-10-24 DIAGNOSIS — N281 Cyst of kidney, acquired: Secondary | ICD-10-CM | POA: Diagnosis not present

## 2022-10-24 DIAGNOSIS — L89626 Pressure-induced deep tissue damage of left heel: Secondary | ICD-10-CM | POA: Diagnosis present

## 2022-10-24 DIAGNOSIS — A419 Sepsis, unspecified organism: Principal | ICD-10-CM | POA: Diagnosis present

## 2022-10-24 DIAGNOSIS — T796XXA Traumatic ischemia of muscle, initial encounter: Secondary | ICD-10-CM | POA: Diagnosis not present

## 2022-10-24 DIAGNOSIS — S2242XA Multiple fractures of ribs, left side, initial encounter for closed fracture: Secondary | ICD-10-CM | POA: Diagnosis present

## 2022-10-24 DIAGNOSIS — L89512 Pressure ulcer of right ankle, stage 2: Secondary | ICD-10-CM | POA: Diagnosis not present

## 2022-10-24 DIAGNOSIS — R680 Hypothermia, not associated with low environmental temperature: Secondary | ICD-10-CM | POA: Diagnosis present

## 2022-10-24 DIAGNOSIS — G238 Other specified degenerative diseases of basal ganglia: Secondary | ICD-10-CM | POA: Diagnosis not present

## 2022-10-24 DIAGNOSIS — M1711 Unilateral primary osteoarthritis, right knee: Secondary | ICD-10-CM | POA: Diagnosis not present

## 2022-10-24 DIAGNOSIS — Z515 Encounter for palliative care: Secondary | ICD-10-CM

## 2022-10-24 DIAGNOSIS — L89516 Pressure-induced deep tissue damage of right ankle: Secondary | ICD-10-CM | POA: Diagnosis present

## 2022-10-24 DIAGNOSIS — L89156 Pressure-induced deep tissue damage of sacral region: Secondary | ICD-10-CM | POA: Diagnosis present

## 2022-10-24 DIAGNOSIS — L8990 Pressure ulcer of unspecified site, unspecified stage: Secondary | ICD-10-CM | POA: Insufficient documentation

## 2022-10-24 DIAGNOSIS — Z79899 Other long term (current) drug therapy: Secondary | ICD-10-CM

## 2022-10-24 DIAGNOSIS — Z9842 Cataract extraction status, left eye: Secondary | ICD-10-CM

## 2022-10-24 DIAGNOSIS — N1832 Chronic kidney disease, stage 3b: Secondary | ICD-10-CM | POA: Diagnosis present

## 2022-10-24 DIAGNOSIS — E870 Hyperosmolality and hypernatremia: Secondary | ICD-10-CM | POA: Diagnosis not present

## 2022-10-24 DIAGNOSIS — M7989 Other specified soft tissue disorders: Secondary | ICD-10-CM | POA: Diagnosis not present

## 2022-10-24 DIAGNOSIS — R6520 Severe sepsis without septic shock: Secondary | ICD-10-CM | POA: Diagnosis present

## 2022-10-24 DIAGNOSIS — Y92094 Garage of other non-institutional residence as the place of occurrence of the external cause: Secondary | ICD-10-CM

## 2022-10-24 DIAGNOSIS — J189 Pneumonia, unspecified organism: Secondary | ICD-10-CM | POA: Diagnosis present

## 2022-10-24 DIAGNOSIS — R000 Tachycardia, unspecified: Secondary | ICD-10-CM | POA: Diagnosis not present

## 2022-10-24 DIAGNOSIS — I6522 Occlusion and stenosis of left carotid artery: Secondary | ICD-10-CM | POA: Diagnosis not present

## 2022-10-24 DIAGNOSIS — I6782 Cerebral ischemia: Secondary | ICD-10-CM | POA: Diagnosis not present

## 2022-10-24 DIAGNOSIS — E875 Hyperkalemia: Secondary | ICD-10-CM | POA: Insufficient documentation

## 2022-10-24 DIAGNOSIS — Z1152 Encounter for screening for COVID-19: Secondary | ICD-10-CM | POA: Diagnosis not present

## 2022-10-24 DIAGNOSIS — M898X8 Other specified disorders of bone, other site: Secondary | ICD-10-CM | POA: Diagnosis not present

## 2022-10-24 DIAGNOSIS — J9811 Atelectasis: Secondary | ICD-10-CM | POA: Diagnosis not present

## 2022-10-24 DIAGNOSIS — Z951 Presence of aortocoronary bypass graft: Secondary | ICD-10-CM

## 2022-10-24 DIAGNOSIS — I672 Cerebral atherosclerosis: Secondary | ICD-10-CM | POA: Diagnosis not present

## 2022-10-24 DIAGNOSIS — I6523 Occlusion and stenosis of bilateral carotid arteries: Secondary | ICD-10-CM | POA: Diagnosis not present

## 2022-10-24 DIAGNOSIS — I129 Hypertensive chronic kidney disease with stage 1 through stage 4 chronic kidney disease, or unspecified chronic kidney disease: Secondary | ICD-10-CM | POA: Diagnosis not present

## 2022-10-24 DIAGNOSIS — R0603 Acute respiratory distress: Secondary | ICD-10-CM | POA: Diagnosis not present

## 2022-10-24 DIAGNOSIS — S2249XA Multiple fractures of ribs, unspecified side, initial encounter for closed fracture: Secondary | ICD-10-CM | POA: Diagnosis not present

## 2022-10-24 DIAGNOSIS — Z66 Do not resuscitate: Secondary | ICD-10-CM | POA: Diagnosis not present

## 2022-10-24 DIAGNOSIS — Z801 Family history of malignant neoplasm of trachea, bronchus and lung: Secondary | ICD-10-CM

## 2022-10-24 DIAGNOSIS — N179 Acute kidney failure, unspecified: Secondary | ICD-10-CM | POA: Insufficient documentation

## 2022-10-24 DIAGNOSIS — G8191 Hemiplegia, unspecified affecting right dominant side: Secondary | ICD-10-CM | POA: Diagnosis present

## 2022-10-24 DIAGNOSIS — M25561 Pain in right knee: Secondary | ICD-10-CM | POA: Diagnosis not present

## 2022-10-24 DIAGNOSIS — R54 Age-related physical debility: Secondary | ICD-10-CM | POA: Diagnosis present

## 2022-10-24 DIAGNOSIS — G9341 Metabolic encephalopathy: Secondary | ICD-10-CM | POA: Diagnosis present

## 2022-10-24 DIAGNOSIS — Z9841 Cataract extraction status, right eye: Secondary | ICD-10-CM

## 2022-10-24 DIAGNOSIS — W19XXXA Unspecified fall, initial encounter: Secondary | ICD-10-CM | POA: Diagnosis not present

## 2022-10-24 DIAGNOSIS — I639 Cerebral infarction, unspecified: Secondary | ICD-10-CM | POA: Diagnosis not present

## 2022-10-24 DIAGNOSIS — I63512 Cerebral infarction due to unspecified occlusion or stenosis of left middle cerebral artery: Secondary | ICD-10-CM | POA: Diagnosis present

## 2022-10-24 DIAGNOSIS — S30810A Abrasion of lower back and pelvis, initial encounter: Secondary | ICD-10-CM | POA: Diagnosis present

## 2022-10-24 DIAGNOSIS — T25022A Burn of unspecified degree of left foot, initial encounter: Secondary | ICD-10-CM | POA: Diagnosis present

## 2022-10-24 DIAGNOSIS — Z961 Presence of intraocular lens: Secondary | ICD-10-CM | POA: Diagnosis present

## 2022-10-24 DIAGNOSIS — Z20828 Contact with and (suspected) exposure to other viral communicable diseases: Secondary | ICD-10-CM | POA: Diagnosis present

## 2022-10-24 DIAGNOSIS — T25021A Burn of unspecified degree of right foot, initial encounter: Secondary | ICD-10-CM | POA: Diagnosis present

## 2022-10-24 DIAGNOSIS — I6389 Other cerebral infarction: Secondary | ICD-10-CM | POA: Diagnosis not present

## 2022-10-24 DIAGNOSIS — I63532 Cerebral infarction due to unspecified occlusion or stenosis of left posterior cerebral artery: Secondary | ICD-10-CM | POA: Diagnosis present

## 2022-10-24 DIAGNOSIS — Z7902 Long term (current) use of antithrombotics/antiplatelets: Secondary | ICD-10-CM

## 2022-10-24 DIAGNOSIS — Z7984 Long term (current) use of oral hypoglycemic drugs: Secondary | ICD-10-CM

## 2022-10-24 DIAGNOSIS — E1165 Type 2 diabetes mellitus with hyperglycemia: Secondary | ICD-10-CM | POA: Diagnosis present

## 2022-10-24 DIAGNOSIS — E872 Acidosis, unspecified: Secondary | ICD-10-CM | POA: Diagnosis present

## 2022-10-24 DIAGNOSIS — S7001XA Contusion of right hip, initial encounter: Secondary | ICD-10-CM | POA: Diagnosis present

## 2022-10-24 DIAGNOSIS — J690 Pneumonitis due to inhalation of food and vomit: Secondary | ICD-10-CM | POA: Diagnosis not present

## 2022-10-24 DIAGNOSIS — M25559 Pain in unspecified hip: Secondary | ICD-10-CM | POA: Diagnosis not present

## 2022-10-24 DIAGNOSIS — Z7982 Long term (current) use of aspirin: Secondary | ICD-10-CM

## 2022-10-24 DIAGNOSIS — I2510 Atherosclerotic heart disease of native coronary artery without angina pectoris: Secondary | ICD-10-CM | POA: Diagnosis present

## 2022-10-24 DIAGNOSIS — Z602 Problems related to living alone: Secondary | ICD-10-CM | POA: Diagnosis present

## 2022-10-24 DIAGNOSIS — E782 Mixed hyperlipidemia: Secondary | ICD-10-CM | POA: Diagnosis not present

## 2022-10-24 DIAGNOSIS — Z807 Family history of other malignant neoplasms of lymphoid, hematopoietic and related tissues: Secondary | ICD-10-CM

## 2022-10-24 DIAGNOSIS — R918 Other nonspecific abnormal finding of lung field: Secondary | ICD-10-CM | POA: Diagnosis not present

## 2022-10-24 DIAGNOSIS — Z9889 Other specified postprocedural states: Secondary | ICD-10-CM

## 2022-10-24 DIAGNOSIS — R29818 Other symptoms and signs involving the nervous system: Secondary | ICD-10-CM | POA: Diagnosis not present

## 2022-10-24 DIAGNOSIS — Z7189 Other specified counseling: Secondary | ICD-10-CM | POA: Diagnosis not present

## 2022-10-24 DIAGNOSIS — R4781 Slurred speech: Secondary | ICD-10-CM | POA: Diagnosis not present

## 2022-10-24 DIAGNOSIS — R1310 Dysphagia, unspecified: Secondary | ICD-10-CM | POA: Diagnosis present

## 2022-10-24 DIAGNOSIS — Z634 Disappearance and death of family member: Secondary | ICD-10-CM

## 2022-10-24 DIAGNOSIS — M6282 Rhabdomyolysis: Secondary | ICD-10-CM | POA: Insufficient documentation

## 2022-10-24 DIAGNOSIS — M47812 Spondylosis without myelopathy or radiculopathy, cervical region: Secondary | ICD-10-CM | POA: Diagnosis not present

## 2022-10-24 DIAGNOSIS — N183 Chronic kidney disease, stage 3 unspecified: Secondary | ICD-10-CM | POA: Diagnosis present

## 2022-10-24 DIAGNOSIS — Z8546 Personal history of malignant neoplasm of prostate: Secondary | ICD-10-CM

## 2022-10-24 LAB — CBC WITH DIFFERENTIAL/PLATELET
Abs Immature Granulocytes: 0.36 10*3/uL — ABNORMAL HIGH (ref 0.00–0.07)
Basophils Absolute: 0.1 10*3/uL (ref 0.0–0.1)
Basophils Relative: 0 %
Eosinophils Absolute: 0 10*3/uL (ref 0.0–0.5)
Eosinophils Relative: 0 %
HCT: 44.8 % (ref 39.0–52.0)
Hemoglobin: 14.4 g/dL (ref 13.0–17.0)
Immature Granulocytes: 1 %
Lymphocytes Relative: 1 %
Lymphs Abs: 0.5 10*3/uL — ABNORMAL LOW (ref 0.7–4.0)
MCH: 31 pg (ref 26.0–34.0)
MCHC: 32.1 g/dL (ref 30.0–36.0)
MCV: 96.3 fL (ref 80.0–100.0)
Monocytes Absolute: 2 10*3/uL — ABNORMAL HIGH (ref 0.1–1.0)
Monocytes Relative: 6 %
Neutro Abs: 32.8 10*3/uL — ABNORMAL HIGH (ref 1.7–7.7)
Neutrophils Relative %: 92 %
Platelets: 220 10*3/uL (ref 150–400)
RBC: 4.65 MIL/uL (ref 4.22–5.81)
RDW: 15.6 % — ABNORMAL HIGH (ref 11.5–15.5)
WBC: 35.6 10*3/uL — ABNORMAL HIGH (ref 4.0–10.5)
nRBC: 0 % (ref 0.0–0.2)

## 2022-10-24 LAB — LACTIC ACID, PLASMA
Lactic Acid, Venous: 4.2 mmol/L (ref 0.5–1.9)
Lactic Acid, Venous: 2.5 mmol/L (ref 0.5–1.9)

## 2022-10-24 LAB — COMPREHENSIVE METABOLIC PANEL
ALT: 76 U/L — ABNORMAL HIGH (ref 0–44)
AST: 95 U/L — ABNORMAL HIGH (ref 15–41)
Albumin: 2.9 g/dL — ABNORMAL LOW (ref 3.5–5.0)
Alkaline Phosphatase: 121 U/L (ref 38–126)
Anion gap: 23 — ABNORMAL HIGH (ref 5–15)
BUN: 115 mg/dL — ABNORMAL HIGH (ref 8–23)
CO2: 16 mmol/L — ABNORMAL LOW (ref 22–32)
Calcium: 7.9 mg/dL — ABNORMAL LOW (ref 8.9–10.3)
Chloride: 100 mmol/L (ref 98–111)
Creatinine, Ser: 4.93 mg/dL — ABNORMAL HIGH (ref 0.61–1.24)
GFR, Estimated: 10 mL/min — ABNORMAL LOW (ref >60)
Glucose, Bld: 223 mg/dL — ABNORMAL HIGH (ref 70–99)
Potassium: 6.1 mmol/L — ABNORMAL HIGH (ref 3.5–5.1)
Sodium: 139 mmol/L (ref 135–145)
Total Bilirubin: 1.4 mg/dL — ABNORMAL HIGH (ref 0.3–1.2)
Total Protein: 6.7 g/dL (ref 6.5–8.1)

## 2022-10-24 LAB — URINALYSIS, ROUTINE W REFLEX MICROSCOPIC
Bilirubin Urine: NEGATIVE
Glucose, UA: 50 mg/dL — AB
Ketones, ur: 5 mg/dL — AB
Leukocytes,Ua: NEGATIVE
Nitrite: NEGATIVE
Protein, ur: 30 mg/dL — AB
Specific Gravity, Urine: 1.016 (ref 1.005–1.030)
pH: 5 (ref 5.0–8.0)

## 2022-10-24 LAB — CK: Total CK: 2153 U/L — ABNORMAL HIGH (ref 49–397)

## 2022-10-24 MED ORDER — SODIUM CHLORIDE 0.9 % IV SOLN
2.0000 g | Freq: Once | INTRAVENOUS | Status: AC
  Administered 2022-10-24: 2 g via INTRAVENOUS
  Filled 2022-10-24: qty 20

## 2022-10-24 MED ORDER — LACTATED RINGERS IV BOLUS
1000.0000 mL | Freq: Once | INTRAVENOUS | Status: AC
  Administered 2022-10-24: 1000 mL via INTRAVENOUS

## 2022-10-24 MED ORDER — SODIUM CHLORIDE 0.9 % IV SOLN
2.0000 g | Freq: Once | INTRAVENOUS | Status: AC
  Administered 2022-10-24: 2 g via INTRAVENOUS
  Filled 2022-10-24: qty 12.5

## 2022-10-24 MED ORDER — LORAZEPAM 2 MG/ML IJ SOLN
0.5000 mg | Freq: Once | INTRAMUSCULAR | Status: AC
  Administered 2022-10-24: 0.5 mg via INTRAVENOUS
  Filled 2022-10-24: qty 1

## 2022-10-24 MED ORDER — LACTATED RINGERS IV SOLN: INTRAVENOUS | Status: DC

## 2022-10-24 MED ORDER — INSULIN ASPART 100 UNIT/ML IV SOLN
5.0000 [IU] | Freq: Once | INTRAVENOUS | Status: AC
  Administered 2022-10-24: 5 [IU] via INTRAVENOUS

## 2022-10-24 MED ORDER — DEXTROSE 50 % IV SOLN
1.0000 | Freq: Once | INTRAVENOUS | Status: AC
  Administered 2022-10-24: 50 mL via INTRAVENOUS
  Filled 2022-10-24: qty 50

## 2022-10-24 MED ORDER — SODIUM CHLORIDE 0.9 % IV SOLN
500.0000 mg | INTRAVENOUS | Status: DC
  Administered 2022-10-24 – 2022-10-30 (×7): 500 mg via INTRAVENOUS
  Filled 2022-10-24 (×7): qty 5

## 2022-10-24 MED ORDER — VANCOMYCIN VARIABLE DOSE PER UNSTABLE RENAL FUNCTION (PHARMACIST DOSING): Status: DC

## 2022-10-24 MED ORDER — VANCOMYCIN HCL 1750 MG/350ML IV SOLN
1750.0000 mg | Freq: Once | INTRAVENOUS | Status: AC
  Administered 2022-10-24: 1750 mg via INTRAVENOUS
  Filled 2022-10-24: qty 350

## 2022-10-24 NOTE — ED Notes (Signed)
Family at bedside Family stated that some was in town from [place], rental car returned at 14:00 FRIDAY Last seen normal then
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
## 2022-10-24 NOTE — ED Provider Notes (Signed)
duct dilatation. Pancreas: Within normal limits. Spleen: Within normal limits Adrenals/Urinary Tract: Adrenal glands are within normal limits. 4.8 cm lateral right lower pole renal cyst (series 10/image 85), measuring fluid density, benign (Bosniak I). Left kidney is within normal limits. No hydronephrosis. Bladder is within normal limits. Stomach/Bowel: Stomach is within normal limits. No evidence of bowel obstruction. Normal appendix (series 2/image 90). No colonic wall thickening or inflammatory changes. Vascular/Lymphatic: No evidence of abdominal aortic aneurysm. Atherosclerotic calcifications of the abdominal aorta and branch vessels. No suspicious abdominopelvic lymphadenopathy. Reproductive: Prostate is unremarkable. Other: No abdominopelvic ascites. Musculoskeletal: Degenerative changes of the lumbar spine. No fracture is seen.  IMPRESSION: Nondisplaced left lateral 4th through 8th rib fractures. No pneumothorax. Scattered peribronchovascular nodularity in the upper lobes, measuring up to 4 mm in the right lung apex, likely infectious/inflammatory. Given the patient's age, no follow-up recommended. No traumatic injury to the abdomen/pelvis. Electronically Signed   By: Charline Bills M.D.   On: 10/19/2022 22:15   CT HEAD WO CONTRAST ( )  Result Date: 11/03/2022 CLINICAL DATA:  Found down, hypothermic, tachycardia EXAM: CT HEAD WITHOUT CONTRAST CT CERVICAL SPINE WITHOUT CONTRAST TECHNIQUE: Multidetector CT imaging of the head and cervical spine was performed following the standard protocol without intravenous contrast. Multiplanar CT image reconstructions of the cervical spine were also generated. RADIATION DOSE REDUCTION: This exam was performed according to the departmental dose-optimization program which includes automated exposure control, adjustment of the mA and/or kV according to patient size and/or use of iterative reconstruction technique. COMPARISON:  None Available. FINDINGS: CT HEAD FINDINGS Brain: No evidence of acute infarction, hemorrhage, hydrocephalus, extra-axial collection or mass lesion/mass effect. Global cortical atrophy. Subcortical white matter and periventricular small vessel ischemic changes. Vascular: Intracranial atherosclerosis. Skull: Normal. Negative for fracture or focal lesion. Sinuses/Orbits: The visualized paranasal sinuses are essentially clear. The mastoid air cells are unopacified. Other: None. CT CERVICAL SPINE FINDINGS Alignment: Normal cervical lordosis. Skull base and vertebrae: No acute fracture. No primary bone lesion or focal pathologic process. Soft tissues and spinal canal: No prevertebral fluid or swelling. No visible canal hematoma. Disc levels: Mild multilevel degenerative changes, most prominent at C6-7. Spinal canal is patent. Upper chest: Visualized lung apices are clear. Other:  Visualized thyroid is unremarkable. IMPRESSION: No acute intracranial abnormality. Atrophy with small vessel ischemic changes. No traumatic injury to the cervical spine. Mild degenerative changes. Electronically Signed   By: Charline Bills M.D.   On: 11/04/2022 22:09   CT CERVICAL SPINE WO CONTRAST  Result Date: 10/14/2022 CLINICAL DATA:  Found down, hypothermic, tachycardia EXAM: CT HEAD WITHOUT CONTRAST CT CERVICAL SPINE WITHOUT CONTRAST TECHNIQUE: Multidetector CT imaging of the head and cervical spine was performed following the standard protocol without intravenous contrast. Multiplanar CT image reconstructions of the cervical spine were also generated. RADIATION DOSE REDUCTION: This exam was performed according to the departmental dose-optimization program which includes automated exposure control, adjustment of the mA and/or kV according to patient size and/or use of iterative reconstruction technique. COMPARISON:  None Available. FINDINGS: CT HEAD FINDINGS Brain: No evidence of acute infarction, hemorrhage, hydrocephalus, extra-axial collection or mass lesion/mass effect. Global cortical atrophy. Subcortical white matter and periventricular small vessel ischemic changes. Vascular: Intracranial atherosclerosis. Skull: Normal. Negative for fracture or focal lesion. Sinuses/Orbits: The visualized paranasal sinuses are essentially clear. The mastoid air cells are unopacified. Other: None. CT CERVICAL SPINE FINDINGS Alignment: Normal cervical lordosis. Skull base and vertebrae: No acute fracture. No primary bone lesion or focal pathologic process. Soft tissues and spinal  duct dilatation. Pancreas: Within normal limits. Spleen: Within normal limits Adrenals/Urinary Tract: Adrenal glands are within normal limits. 4.8 cm lateral right lower pole renal cyst (series 10/image 85), measuring fluid density, benign (Bosniak I). Left kidney is within normal limits. No hydronephrosis. Bladder is within normal limits. Stomach/Bowel: Stomach is within normal limits. No evidence of bowel obstruction. Normal appendix (series 2/image 90). No colonic wall thickening or inflammatory changes. Vascular/Lymphatic: No evidence of abdominal aortic aneurysm. Atherosclerotic calcifications of the abdominal aorta and branch vessels. No suspicious abdominopelvic lymphadenopathy. Reproductive: Prostate is unremarkable. Other: No abdominopelvic ascites. Musculoskeletal: Degenerative changes of the lumbar spine. No fracture is seen.  IMPRESSION: Nondisplaced left lateral 4th through 8th rib fractures. No pneumothorax. Scattered peribronchovascular nodularity in the upper lobes, measuring up to 4 mm in the right lung apex, likely infectious/inflammatory. Given the patient's age, no follow-up recommended. No traumatic injury to the abdomen/pelvis. Electronically Signed   By: Charline Bills M.D.   On: 10/19/2022 22:15   CT HEAD WO CONTRAST ( )  Result Date: 11/03/2022 CLINICAL DATA:  Found down, hypothermic, tachycardia EXAM: CT HEAD WITHOUT CONTRAST CT CERVICAL SPINE WITHOUT CONTRAST TECHNIQUE: Multidetector CT imaging of the head and cervical spine was performed following the standard protocol without intravenous contrast. Multiplanar CT image reconstructions of the cervical spine were also generated. RADIATION DOSE REDUCTION: This exam was performed according to the departmental dose-optimization program which includes automated exposure control, adjustment of the mA and/or kV according to patient size and/or use of iterative reconstruction technique. COMPARISON:  None Available. FINDINGS: CT HEAD FINDINGS Brain: No evidence of acute infarction, hemorrhage, hydrocephalus, extra-axial collection or mass lesion/mass effect. Global cortical atrophy. Subcortical white matter and periventricular small vessel ischemic changes. Vascular: Intracranial atherosclerosis. Skull: Normal. Negative for fracture or focal lesion. Sinuses/Orbits: The visualized paranasal sinuses are essentially clear. The mastoid air cells are unopacified. Other: None. CT CERVICAL SPINE FINDINGS Alignment: Normal cervical lordosis. Skull base and vertebrae: No acute fracture. No primary bone lesion or focal pathologic process. Soft tissues and spinal canal: No prevertebral fluid or swelling. No visible canal hematoma. Disc levels: Mild multilevel degenerative changes, most prominent at C6-7. Spinal canal is patent. Upper chest: Visualized lung apices are clear. Other:  Visualized thyroid is unremarkable. IMPRESSION: No acute intracranial abnormality. Atrophy with small vessel ischemic changes. No traumatic injury to the cervical spine. Mild degenerative changes. Electronically Signed   By: Charline Bills M.D.   On: 11/04/2022 22:09   CT CERVICAL SPINE WO CONTRAST  Result Date: 10/14/2022 CLINICAL DATA:  Found down, hypothermic, tachycardia EXAM: CT HEAD WITHOUT CONTRAST CT CERVICAL SPINE WITHOUT CONTRAST TECHNIQUE: Multidetector CT imaging of the head and cervical spine was performed following the standard protocol without intravenous contrast. Multiplanar CT image reconstructions of the cervical spine were also generated. RADIATION DOSE REDUCTION: This exam was performed according to the departmental dose-optimization program which includes automated exposure control, adjustment of the mA and/or kV according to patient size and/or use of iterative reconstruction technique. COMPARISON:  None Available. FINDINGS: CT HEAD FINDINGS Brain: No evidence of acute infarction, hemorrhage, hydrocephalus, extra-axial collection or mass lesion/mass effect. Global cortical atrophy. Subcortical white matter and periventricular small vessel ischemic changes. Vascular: Intracranial atherosclerosis. Skull: Normal. Negative for fracture or focal lesion. Sinuses/Orbits: The visualized paranasal sinuses are essentially clear. The mastoid air cells are unopacified. Other: None. CT CERVICAL SPINE FINDINGS Alignment: Normal cervical lordosis. Skull base and vertebrae: No acute fracture. No primary bone lesion or focal pathologic process. Soft tissues and spinal  All other components within normal limits  LACTIC ACID, PLASMA - Abnormal; Notable for the following components:   Lactic Acid, Venous 4.2 (*)    All other components within normal limits  LACTIC ACID, PLASMA - Abnormal; Notable for the following components:   Lactic Acid, Venous 2.5 (*)    All other components within normal limits  URINALYSIS, ROUTINE W REFLEX MICROSCOPIC - Abnormal; Notable for the following components:   APPearance HAZY (*)    Glucose, UA 50 (*)    Hgb urine dipstick MODERATE (*)    Ketones, ur 5 (*)    Protein, ur 30 (*)    Bacteria, UA RARE (*)    All other components within normal limits  CK - Abnormal; Notable for the following components:   Total CK 2,153 (*)    All other components within normal limits  COMPREHENSIVE METABOLIC PANEL - Abnormal; Notable for the following components:   Potassium 6.1 (*)    CO2 16 (*)    Glucose, Bld 223 (*)    BUN 115 (*)    Creatinine, Ser 4.93 (*)    Calcium 7.9 (*)    Albumin 2.9 (*)    AST 95 (*)    ALT 76 (*)    Total Bilirubin 1.4 (*)    GFR, Estimated 10 (*)    Anion gap 23 (*)    All other components within normal limits  CULTURE, BLOOD (ROUTINE X 2)  CULTURE, BLOOD (ROUTINE X 2)  SARS CORONAVIRUS 2 BY RT PCR  MRSA NEXT GEN BY PCR, NASAL  CK  LACTIC ACID, PLASMA                                                                                                                          Radiology CT CHEST ABDOMEN PELVIS WO CONTRAST  Result Date: 11/07/2022 CLINICAL DATA:  Trauma, found down, hypothermia, tachycardia EXAM: CT CHEST, ABDOMEN AND PELVIS WITHOUT CONTRAST TECHNIQUE: Multidetector CT imaging of the chest, abdomen and pelvis was performed following the standard protocol without IV contrast. RADIATION DOSE REDUCTION: This exam was performed according to the departmental dose-optimization program which includes automated exposure control,  adjustment of the mA and/or kV according to patient size and/or use of iterative reconstruction technique. COMPARISON:  None Available. FINDINGS: Motion degraded images. CT CHEST FINDINGS Cardiovascular: The heart is top-normal in size. No pericardial effusion. No evidence of thoracic aneurysm. Atherosclerotic calcifications of the aortic arch. Severe three-vessel coronary atherosclerosis. Postsurgical changes related to prior CABG. Mediastinum/Nodes: No suspicious mediastinal lymphadenopathy. Visualized thyroid is unremarkable. Lungs/Pleura: Scattered peribronchovascular nodularity in the upper lobes, measuring up to 4 mm in the right lung apex (series 3/image 40), likely infectious/inflammatory. Mild patchy right lower lobe opacity, likely atelectasis. No focal consolidation. No pleural effusion or pneumothorax. Musculoskeletal: Thoracic spine is within normal limits. Median sternotomy. Nondisplaced left lateral 4th through 8th rib fractures. CT ABDOMEN PELVIS FINDINGS Hepatobiliary: Unenhanced liver is unremarkable. Gallbladder is unremarkable. No intrahepatic or extrahepatic  duct dilatation. Pancreas: Within normal limits. Spleen: Within normal limits Adrenals/Urinary Tract: Adrenal glands are within normal limits. 4.8 cm lateral right lower pole renal cyst (series 10/image 85), measuring fluid density, benign (Bosniak I). Left kidney is within normal limits. No hydronephrosis. Bladder is within normal limits. Stomach/Bowel: Stomach is within normal limits. No evidence of bowel obstruction. Normal appendix (series 2/image 90). No colonic wall thickening or inflammatory changes. Vascular/Lymphatic: No evidence of abdominal aortic aneurysm. Atherosclerotic calcifications of the abdominal aorta and branch vessels. No suspicious abdominopelvic lymphadenopathy. Reproductive: Prostate is unremarkable. Other: No abdominopelvic ascites. Musculoskeletal: Degenerative changes of the lumbar spine. No fracture is seen.  IMPRESSION: Nondisplaced left lateral 4th through 8th rib fractures. No pneumothorax. Scattered peribronchovascular nodularity in the upper lobes, measuring up to 4 mm in the right lung apex, likely infectious/inflammatory. Given the patient's age, no follow-up recommended. No traumatic injury to the abdomen/pelvis. Electronically Signed   By: Charline Bills M.D.   On: 10/19/2022 22:15   CT HEAD WO CONTRAST ( )  Result Date: 11/03/2022 CLINICAL DATA:  Found down, hypothermic, tachycardia EXAM: CT HEAD WITHOUT CONTRAST CT CERVICAL SPINE WITHOUT CONTRAST TECHNIQUE: Multidetector CT imaging of the head and cervical spine was performed following the standard protocol without intravenous contrast. Multiplanar CT image reconstructions of the cervical spine were also generated. RADIATION DOSE REDUCTION: This exam was performed according to the departmental dose-optimization program which includes automated exposure control, adjustment of the mA and/or kV according to patient size and/or use of iterative reconstruction technique. COMPARISON:  None Available. FINDINGS: CT HEAD FINDINGS Brain: No evidence of acute infarction, hemorrhage, hydrocephalus, extra-axial collection or mass lesion/mass effect. Global cortical atrophy. Subcortical white matter and periventricular small vessel ischemic changes. Vascular: Intracranial atherosclerosis. Skull: Normal. Negative for fracture or focal lesion. Sinuses/Orbits: The visualized paranasal sinuses are essentially clear. The mastoid air cells are unopacified. Other: None. CT CERVICAL SPINE FINDINGS Alignment: Normal cervical lordosis. Skull base and vertebrae: No acute fracture. No primary bone lesion or focal pathologic process. Soft tissues and spinal canal: No prevertebral fluid or swelling. No visible canal hematoma. Disc levels: Mild multilevel degenerative changes, most prominent at C6-7. Spinal canal is patent. Upper chest: Visualized lung apices are clear. Other:  Visualized thyroid is unremarkable. IMPRESSION: No acute intracranial abnormality. Atrophy with small vessel ischemic changes. No traumatic injury to the cervical spine. Mild degenerative changes. Electronically Signed   By: Charline Bills M.D.   On: 11/04/2022 22:09   CT CERVICAL SPINE WO CONTRAST  Result Date: 10/14/2022 CLINICAL DATA:  Found down, hypothermic, tachycardia EXAM: CT HEAD WITHOUT CONTRAST CT CERVICAL SPINE WITHOUT CONTRAST TECHNIQUE: Multidetector CT imaging of the head and cervical spine was performed following the standard protocol without intravenous contrast. Multiplanar CT image reconstructions of the cervical spine were also generated. RADIATION DOSE REDUCTION: This exam was performed according to the departmental dose-optimization program which includes automated exposure control, adjustment of the mA and/or kV according to patient size and/or use of iterative reconstruction technique. COMPARISON:  None Available. FINDINGS: CT HEAD FINDINGS Brain: No evidence of acute infarction, hemorrhage, hydrocephalus, extra-axial collection or mass lesion/mass effect. Global cortical atrophy. Subcortical white matter and periventricular small vessel ischemic changes. Vascular: Intracranial atherosclerosis. Skull: Normal. Negative for fracture or focal lesion. Sinuses/Orbits: The visualized paranasal sinuses are essentially clear. The mastoid air cells are unopacified. Other: None. CT CERVICAL SPINE FINDINGS Alignment: Normal cervical lordosis. Skull base and vertebrae: No acute fracture. No primary bone lesion or focal pathologic process. Soft tissues and spinal  All other components within normal limits  LACTIC ACID, PLASMA - Abnormal; Notable for the following components:   Lactic Acid, Venous 4.2 (*)    All other components within normal limits  LACTIC ACID, PLASMA - Abnormal; Notable for the following components:   Lactic Acid, Venous 2.5 (*)    All other components within normal limits  URINALYSIS, ROUTINE W REFLEX MICROSCOPIC - Abnormal; Notable for the following components:   APPearance HAZY (*)    Glucose, UA 50 (*)    Hgb urine dipstick MODERATE (*)    Ketones, ur 5 (*)    Protein, ur 30 (*)    Bacteria, UA RARE (*)    All other components within normal limits  CK - Abnormal; Notable for the following components:   Total CK 2,153 (*)    All other components within normal limits  COMPREHENSIVE METABOLIC PANEL - Abnormal; Notable for the following components:   Potassium 6.1 (*)    CO2 16 (*)    Glucose, Bld 223 (*)    BUN 115 (*)    Creatinine, Ser 4.93 (*)    Calcium 7.9 (*)    Albumin 2.9 (*)    AST 95 (*)    ALT 76 (*)    Total Bilirubin 1.4 (*)    GFR, Estimated 10 (*)    Anion gap 23 (*)    All other components within normal limits  CULTURE, BLOOD (ROUTINE X 2)  CULTURE, BLOOD (ROUTINE X 2)  SARS CORONAVIRUS 2 BY RT PCR  MRSA NEXT GEN BY PCR, NASAL  CK  LACTIC ACID, PLASMA                                                                                                                          Radiology CT CHEST ABDOMEN PELVIS WO CONTRAST  Result Date: 11/07/2022 CLINICAL DATA:  Trauma, found down, hypothermia, tachycardia EXAM: CT CHEST, ABDOMEN AND PELVIS WITHOUT CONTRAST TECHNIQUE: Multidetector CT imaging of the chest, abdomen and pelvis was performed following the standard protocol without IV contrast. RADIATION DOSE REDUCTION: This exam was performed according to the departmental dose-optimization program which includes automated exposure control,  adjustment of the mA and/or kV according to patient size and/or use of iterative reconstruction technique. COMPARISON:  None Available. FINDINGS: Motion degraded images. CT CHEST FINDINGS Cardiovascular: The heart is top-normal in size. No pericardial effusion. No evidence of thoracic aneurysm. Atherosclerotic calcifications of the aortic arch. Severe three-vessel coronary atherosclerosis. Postsurgical changes related to prior CABG. Mediastinum/Nodes: No suspicious mediastinal lymphadenopathy. Visualized thyroid is unremarkable. Lungs/Pleura: Scattered peribronchovascular nodularity in the upper lobes, measuring up to 4 mm in the right lung apex (series 3/image 40), likely infectious/inflammatory. Mild patchy right lower lobe opacity, likely atelectasis. No focal consolidation. No pleural effusion or pneumothorax. Musculoskeletal: Thoracic spine is within normal limits. Median sternotomy. Nondisplaced left lateral 4th through 8th rib fractures. CT ABDOMEN PELVIS FINDINGS Hepatobiliary: Unenhanced liver is unremarkable. Gallbladder is unremarkable. No intrahepatic or extrahepatic  All other components within normal limits  LACTIC ACID, PLASMA - Abnormal; Notable for the following components:   Lactic Acid, Venous 4.2 (*)    All other components within normal limits  LACTIC ACID, PLASMA - Abnormal; Notable for the following components:   Lactic Acid, Venous 2.5 (*)    All other components within normal limits  URINALYSIS, ROUTINE W REFLEX MICROSCOPIC - Abnormal; Notable for the following components:   APPearance HAZY (*)    Glucose, UA 50 (*)    Hgb urine dipstick MODERATE (*)    Ketones, ur 5 (*)    Protein, ur 30 (*)    Bacteria, UA RARE (*)    All other components within normal limits  CK - Abnormal; Notable for the following components:   Total CK 2,153 (*)    All other components within normal limits  COMPREHENSIVE METABOLIC PANEL - Abnormal; Notable for the following components:   Potassium 6.1 (*)    CO2 16 (*)    Glucose, Bld 223 (*)    BUN 115 (*)    Creatinine, Ser 4.93 (*)    Calcium 7.9 (*)    Albumin 2.9 (*)    AST 95 (*)    ALT 76 (*)    Total Bilirubin 1.4 (*)    GFR, Estimated 10 (*)    Anion gap 23 (*)    All other components within normal limits  CULTURE, BLOOD (ROUTINE X 2)  CULTURE, BLOOD (ROUTINE X 2)  SARS CORONAVIRUS 2 BY RT PCR  MRSA NEXT GEN BY PCR, NASAL  CK  LACTIC ACID, PLASMA                                                                                                                          Radiology CT CHEST ABDOMEN PELVIS WO CONTRAST  Result Date: 11/07/2022 CLINICAL DATA:  Trauma, found down, hypothermia, tachycardia EXAM: CT CHEST, ABDOMEN AND PELVIS WITHOUT CONTRAST TECHNIQUE: Multidetector CT imaging of the chest, abdomen and pelvis was performed following the standard protocol without IV contrast. RADIATION DOSE REDUCTION: This exam was performed according to the departmental dose-optimization program which includes automated exposure control,  adjustment of the mA and/or kV according to patient size and/or use of iterative reconstruction technique. COMPARISON:  None Available. FINDINGS: Motion degraded images. CT CHEST FINDINGS Cardiovascular: The heart is top-normal in size. No pericardial effusion. No evidence of thoracic aneurysm. Atherosclerotic calcifications of the aortic arch. Severe three-vessel coronary atherosclerosis. Postsurgical changes related to prior CABG. Mediastinum/Nodes: No suspicious mediastinal lymphadenopathy. Visualized thyroid is unremarkable. Lungs/Pleura: Scattered peribronchovascular nodularity in the upper lobes, measuring up to 4 mm in the right lung apex (series 3/image 40), likely infectious/inflammatory. Mild patchy right lower lobe opacity, likely atelectasis. No focal consolidation. No pleural effusion or pneumothorax. Musculoskeletal: Thoracic spine is within normal limits. Median sternotomy. Nondisplaced left lateral 4th through 8th rib fractures. CT ABDOMEN PELVIS FINDINGS Hepatobiliary: Unenhanced liver is unremarkable. Gallbladder is unremarkable. No intrahepatic or extrahepatic  All other components within normal limits  LACTIC ACID, PLASMA - Abnormal; Notable for the following components:   Lactic Acid, Venous 4.2 (*)    All other components within normal limits  LACTIC ACID, PLASMA - Abnormal; Notable for the following components:   Lactic Acid, Venous 2.5 (*)    All other components within normal limits  URINALYSIS, ROUTINE W REFLEX MICROSCOPIC - Abnormal; Notable for the following components:   APPearance HAZY (*)    Glucose, UA 50 (*)    Hgb urine dipstick MODERATE (*)    Ketones, ur 5 (*)    Protein, ur 30 (*)    Bacteria, UA RARE (*)    All other components within normal limits  CK - Abnormal; Notable for the following components:   Total CK 2,153 (*)    All other components within normal limits  COMPREHENSIVE METABOLIC PANEL - Abnormal; Notable for the following components:   Potassium 6.1 (*)    CO2 16 (*)    Glucose, Bld 223 (*)    BUN 115 (*)    Creatinine, Ser 4.93 (*)    Calcium 7.9 (*)    Albumin 2.9 (*)    AST 95 (*)    ALT 76 (*)    Total Bilirubin 1.4 (*)    GFR, Estimated 10 (*)    Anion gap 23 (*)    All other components within normal limits  CULTURE, BLOOD (ROUTINE X 2)  CULTURE, BLOOD (ROUTINE X 2)  SARS CORONAVIRUS 2 BY RT PCR  MRSA NEXT GEN BY PCR, NASAL  CK  LACTIC ACID, PLASMA                                                                                                                          Radiology CT CHEST ABDOMEN PELVIS WO CONTRAST  Result Date: 11/07/2022 CLINICAL DATA:  Trauma, found down, hypothermia, tachycardia EXAM: CT CHEST, ABDOMEN AND PELVIS WITHOUT CONTRAST TECHNIQUE: Multidetector CT imaging of the chest, abdomen and pelvis was performed following the standard protocol without IV contrast. RADIATION DOSE REDUCTION: This exam was performed according to the departmental dose-optimization program which includes automated exposure control,  adjustment of the mA and/or kV according to patient size and/or use of iterative reconstruction technique. COMPARISON:  None Available. FINDINGS: Motion degraded images. CT CHEST FINDINGS Cardiovascular: The heart is top-normal in size. No pericardial effusion. No evidence of thoracic aneurysm. Atherosclerotic calcifications of the aortic arch. Severe three-vessel coronary atherosclerosis. Postsurgical changes related to prior CABG. Mediastinum/Nodes: No suspicious mediastinal lymphadenopathy. Visualized thyroid is unremarkable. Lungs/Pleura: Scattered peribronchovascular nodularity in the upper lobes, measuring up to 4 mm in the right lung apex (series 3/image 40), likely infectious/inflammatory. Mild patchy right lower lobe opacity, likely atelectasis. No focal consolidation. No pleural effusion or pneumothorax. Musculoskeletal: Thoracic spine is within normal limits. Median sternotomy. Nondisplaced left lateral 4th through 8th rib fractures. CT ABDOMEN PELVIS FINDINGS Hepatobiliary: Unenhanced liver is unremarkable. Gallbladder is unremarkable. No intrahepatic or extrahepatic  duct dilatation. Pancreas: Within normal limits. Spleen: Within normal limits Adrenals/Urinary Tract: Adrenal glands are within normal limits. 4.8 cm lateral right lower pole renal cyst (series 10/image 85), measuring fluid density, benign (Bosniak I). Left kidney is within normal limits. No hydronephrosis. Bladder is within normal limits. Stomach/Bowel: Stomach is within normal limits. No evidence of bowel obstruction. Normal appendix (series 2/image 90). No colonic wall thickening or inflammatory changes. Vascular/Lymphatic: No evidence of abdominal aortic aneurysm. Atherosclerotic calcifications of the abdominal aorta and branch vessels. No suspicious abdominopelvic lymphadenopathy. Reproductive: Prostate is unremarkable. Other: No abdominopelvic ascites. Musculoskeletal: Degenerative changes of the lumbar spine. No fracture is seen.  IMPRESSION: Nondisplaced left lateral 4th through 8th rib fractures. No pneumothorax. Scattered peribronchovascular nodularity in the upper lobes, measuring up to 4 mm in the right lung apex, likely infectious/inflammatory. Given the patient's age, no follow-up recommended. No traumatic injury to the abdomen/pelvis. Electronically Signed   By: Charline Bills M.D.   On: 10/19/2022 22:15   CT HEAD WO CONTRAST ( )  Result Date: 11/03/2022 CLINICAL DATA:  Found down, hypothermic, tachycardia EXAM: CT HEAD WITHOUT CONTRAST CT CERVICAL SPINE WITHOUT CONTRAST TECHNIQUE: Multidetector CT imaging of the head and cervical spine was performed following the standard protocol without intravenous contrast. Multiplanar CT image reconstructions of the cervical spine were also generated. RADIATION DOSE REDUCTION: This exam was performed according to the departmental dose-optimization program which includes automated exposure control, adjustment of the mA and/or kV according to patient size and/or use of iterative reconstruction technique. COMPARISON:  None Available. FINDINGS: CT HEAD FINDINGS Brain: No evidence of acute infarction, hemorrhage, hydrocephalus, extra-axial collection or mass lesion/mass effect. Global cortical atrophy. Subcortical white matter and periventricular small vessel ischemic changes. Vascular: Intracranial atherosclerosis. Skull: Normal. Negative for fracture or focal lesion. Sinuses/Orbits: The visualized paranasal sinuses are essentially clear. The mastoid air cells are unopacified. Other: None. CT CERVICAL SPINE FINDINGS Alignment: Normal cervical lordosis. Skull base and vertebrae: No acute fracture. No primary bone lesion or focal pathologic process. Soft tissues and spinal canal: No prevertebral fluid or swelling. No visible canal hematoma. Disc levels: Mild multilevel degenerative changes, most prominent at C6-7. Spinal canal is patent. Upper chest: Visualized lung apices are clear. Other:  Visualized thyroid is unremarkable. IMPRESSION: No acute intracranial abnormality. Atrophy with small vessel ischemic changes. No traumatic injury to the cervical spine. Mild degenerative changes. Electronically Signed   By: Charline Bills M.D.   On: 11/04/2022 22:09   CT CERVICAL SPINE WO CONTRAST  Result Date: 10/14/2022 CLINICAL DATA:  Found down, hypothermic, tachycardia EXAM: CT HEAD WITHOUT CONTRAST CT CERVICAL SPINE WITHOUT CONTRAST TECHNIQUE: Multidetector CT imaging of the head and cervical spine was performed following the standard protocol without intravenous contrast. Multiplanar CT image reconstructions of the cervical spine were also generated. RADIATION DOSE REDUCTION: This exam was performed according to the departmental dose-optimization program which includes automated exposure control, adjustment of the mA and/or kV according to patient size and/or use of iterative reconstruction technique. COMPARISON:  None Available. FINDINGS: CT HEAD FINDINGS Brain: No evidence of acute infarction, hemorrhage, hydrocephalus, extra-axial collection or mass lesion/mass effect. Global cortical atrophy. Subcortical white matter and periventricular small vessel ischemic changes. Vascular: Intracranial atherosclerosis. Skull: Normal. Negative for fracture or focal lesion. Sinuses/Orbits: The visualized paranasal sinuses are essentially clear. The mastoid air cells are unopacified. Other: None. CT CERVICAL SPINE FINDINGS Alignment: Normal cervical lordosis. Skull base and vertebrae: No acute fracture. No primary bone lesion or focal pathologic process. Soft tissues and spinal

## 2022-10-24 NOTE — Consult Note (Signed)
Pharmacy Antibiotic Note  Victor Tapia. is a 89 y.o. male admitted on 10/18/2022 with  altered mental status s/p fall . Patient was found on the garage floor and last known well was Friday evening, per family. Pharmacy has been consulted for Vancomycin dosing. Serum creatinine is elevated with unknown baseline  Plan: vancomycin 1750 mg IV x 1  ---will place variable vancomycin dose placeholder ---daily SCr while on IV vancomycin    Height: 5\' 9"  (175.3 cm) Weight: 81.6 kg (180 lb) IBW/kg (Calculated) : 70.7  Temp (24hrs), Avg:95.5 F (35.3 C), Min:95 F (35 C), Max:96 F (35.6 C)  Recent Labs  Lab 11/02/2022 1746 10/30/2022 1806  WBC 35.6*  --   LATICACIDVEN  --  4.2*    CrCl cannot be calculated (Patient's most recent lab result is older than the maximum 21 days allowed.).    No Known Allergies  Antimicrobials this admission: vancomycin 9/16 >>  cefepime 9/16 x1  Dose adjustments this admission: N/A  Microbiology results: 9/16 BCx: collected  Thank you for allowing pharmacy to be a part of this patient's care.  Lowella Bandy 10/31/2022 8:21 PM

## 2022-10-24 NOTE — Consult Note (Incomplete)
Pharmacy Antibiotic Note  Victor Tapia. is a 89 y.o. male admitted on 10/13/2022 with  altered mental status s/p fall . Patient was found on the garage floor and last known well was Friday evening, per family. Pharmacy has been consulted for Vancomycin dosing.  Plan: {Assessment:21075}  Height: 5\' 9"  (175.3 cm) Weight: 81.6 kg (180 lb) IBW/kg (Calculated) : 70.7  Temp (24hrs), Avg:95.5 F (35.3 C), Min:95 F (35 C), Max:96 F (35.6 C)  Recent Labs  Lab 10/25/2022 1746 11/02/2022 1806  WBC 35.6*  --   LATICACIDVEN  --  4.2*    CrCl cannot be calculated (Patient's most recent lab result is older than the maximum 21 days allowed.).    No Known Allergies  Antimicrobials this admission: Vancomycin 9/16 >>  Cefepime 9/16 x1  Dose adjustments this admission: N/A  Microbiology results: 9/16 BCx: collected  Thank you for allowing pharmacy to be a part of this patient's care.  Bettey Costa 11/04/2022 6:58 PM

## 2022-10-24 NOTE — ED Notes (Signed)
B[facility] hugger recd from facilities Placed on pt

## 2022-10-24 NOTE — ED Notes (Signed)
Family informed this RN that pt's son that

## 2022-10-24 NOTE — ED Notes (Signed)
Soft restraints placed Informed MD Medicated per [facility]

## 2022-10-25 ENCOUNTER — Other Ambulatory Visit: Payer: Self-pay

## 2022-10-25 DIAGNOSIS — T796XXA Traumatic ischemia of muscle, initial encounter: Secondary | ICD-10-CM | POA: Diagnosis not present

## 2022-10-25 DIAGNOSIS — E1122 Type 2 diabetes mellitus with diabetic chronic kidney disease: Secondary | ICD-10-CM | POA: Diagnosis not present

## 2022-10-25 DIAGNOSIS — I10 Essential (primary) hypertension: Secondary | ICD-10-CM | POA: Diagnosis not present

## 2022-10-25 DIAGNOSIS — A419 Sepsis, unspecified organism: Secondary | ICD-10-CM | POA: Diagnosis not present

## 2022-10-25 DIAGNOSIS — M6282 Rhabdomyolysis: Secondary | ICD-10-CM | POA: Insufficient documentation

## 2022-10-25 DIAGNOSIS — Z7189 Other specified counseling: Secondary | ICD-10-CM | POA: Diagnosis not present

## 2022-10-25 DIAGNOSIS — N179 Acute kidney failure, unspecified: Secondary | ICD-10-CM | POA: Insufficient documentation

## 2022-10-25 DIAGNOSIS — E782 Mixed hyperlipidemia: Secondary | ICD-10-CM

## 2022-10-25 DIAGNOSIS — N1832 Chronic kidney disease, stage 3b: Secondary | ICD-10-CM

## 2022-10-25 DIAGNOSIS — J189 Pneumonia, unspecified organism: Secondary | ICD-10-CM | POA: Diagnosis not present

## 2022-10-25 DIAGNOSIS — E875 Hyperkalemia: Secondary | ICD-10-CM | POA: Diagnosis not present

## 2022-10-25 DIAGNOSIS — Z515 Encounter for palliative care: Secondary | ICD-10-CM | POA: Diagnosis not present

## 2022-10-25 DIAGNOSIS — L8990 Pressure ulcer of unspecified site, unspecified stage: Secondary | ICD-10-CM | POA: Insufficient documentation

## 2022-10-25 LAB — CBC WITH DIFFERENTIAL/PLATELET
Abs Immature Granulocytes: 0 10*3/uL (ref 0.00–0.07)
Band Neutrophils: 1 %
Basophils Absolute: 0 10*3/uL (ref 0.0–0.1)
Basophils Relative: 0 %
Eosinophils Absolute: 0 10*3/uL (ref 0.0–0.5)
Eosinophils Relative: 0 %
HCT: 37.4 % — ABNORMAL LOW (ref 39.0–52.0)
Hemoglobin: 11.9 g/dL — ABNORMAL LOW (ref 13.0–17.0)
Lymphocytes Relative: 2 %
Lymphs Abs: 0.6 10*3/uL — ABNORMAL LOW (ref 0.7–4.0)
MCH: 30.5 pg (ref 26.0–34.0)
MCHC: 31.8 g/dL (ref 30.0–36.0)
MCV: 95.9 fL (ref 80.0–100.0)
Monocytes Absolute: 1.2 10*3/uL — ABNORMAL HIGH (ref 0.1–1.0)
Monocytes Relative: 4 %
Neutro Abs: 28.9 10*3/uL — ABNORMAL HIGH (ref 1.7–7.7)
Neutrophils Relative %: 93 %
Platelets: 202 10*3/uL (ref 150–400)
RBC: 3.9 MIL/uL — ABNORMAL LOW (ref 4.22–5.81)
RDW: 15.6 % — ABNORMAL HIGH (ref 11.5–15.5)
WBC: 30.7 10*3/uL — ABNORMAL HIGH (ref 4.0–10.5)
nRBC: 0 % (ref 0.0–0.2)

## 2022-10-25 LAB — COMPREHENSIVE METABOLIC PANEL
ALT: 69 U/L — ABNORMAL HIGH (ref 0–44)
AST: 89 U/L — ABNORMAL HIGH (ref 15–41)
Albumin: 2.6 g/dL — ABNORMAL LOW (ref 3.5–5.0)
Alkaline Phosphatase: 99 U/L (ref 38–126)
Anion gap: 23 — ABNORMAL HIGH (ref 5–15)
BUN: 110 mg/dL — ABNORMAL HIGH (ref 8–23)
CO2: 13 mmol/L — ABNORMAL LOW (ref 22–32)
Calcium: 7.6 mg/dL — ABNORMAL LOW (ref 8.9–10.3)
Chloride: 103 mmol/L (ref 98–111)
Creatinine, Ser: 4.76 mg/dL — ABNORMAL HIGH (ref 0.61–1.24)
GFR, Estimated: 11 mL/min — ABNORMAL LOW (ref >60)
Glucose, Bld: 234 mg/dL — ABNORMAL HIGH (ref 70–99)
Potassium: 5.9 mmol/L — ABNORMAL HIGH (ref 3.5–5.1)
Sodium: 139 mmol/L (ref 135–145)
Total Bilirubin: 1.1 mg/dL (ref 0.3–1.2)
Total Protein: 6.2 g/dL — ABNORMAL LOW (ref 6.5–8.1)

## 2022-10-25 LAB — LACTIC ACID, PLASMA
Lactic Acid, Venous: 2.3 mmol/L (ref 0.5–1.9)
Lactic Acid, Venous: 3.6 mmol/L (ref 0.5–1.9)

## 2022-10-25 LAB — HEMOGLOBIN A1C
Hgb A1c MFr Bld: 5.9 % — ABNORMAL HIGH (ref 4.8–5.6)
Mean Plasma Glucose: 122.63 mg/dL

## 2022-10-25 LAB — STREP PNEUMONIAE URINARY ANTIGEN: Strep Pneumo Urinary Antigen: NEGATIVE

## 2022-10-25 LAB — GLUCOSE, CAPILLARY
Glucose-Capillary: 214 mg/dL — ABNORMAL HIGH (ref 70–99)
Glucose-Capillary: 168 mg/dL — ABNORMAL HIGH (ref 70–99)
Glucose-Capillary: 139 mg/dL — ABNORMAL HIGH (ref 70–99)
Glucose-Capillary: 139 mg/dL — ABNORMAL HIGH (ref 70–99)

## 2022-10-25 LAB — CK: Total CK: 2428 U/L — ABNORMAL HIGH (ref 49–397)

## 2022-10-25 LAB — MAGNESIUM: Magnesium: 2.4 mg/dL (ref 1.7–2.4)

## 2022-10-25 LAB — MRSA NEXT GEN BY PCR, NASAL: MRSA by PCR Next Gen: NOT DETECTED

## 2022-10-25 LAB — PROCALCITONIN: Procalcitonin: 9.28 ng/mL

## 2022-10-25 LAB — SARS CORONAVIRUS 2 BY RT PCR: SARS Coronavirus 2 by RT PCR: NEGATIVE

## 2022-10-25 MED ORDER — HYDRALAZINE HCL 20 MG/ML IJ SOLN
10.0000 mg | Freq: Three times a day (TID) | INTRAMUSCULAR | Status: DC | PRN
  Administered 2022-10-28: 10 mg via INTRAVENOUS
  Filled 2022-10-25: qty 1

## 2022-10-25 MED ORDER — BUDESONIDE 0.5 MG/2ML IN SUSP
0.5000 mg | Freq: Two times a day (BID) | RESPIRATORY_TRACT | Status: DC
  Administered 2022-10-25 – 2022-10-31 (×13): 0.5 mg via RESPIRATORY_TRACT
  Filled 2022-10-25 (×13): qty 2

## 2022-10-25 MED ORDER — INSULIN ASPART 100 UNIT/ML IJ SOLN
0.0000 [IU] | Freq: Three times a day (TID) | INTRAMUSCULAR | Status: DC
  Administered 2022-10-25: 3 [IU] via SUBCUTANEOUS
  Administered 2022-10-25: 2 [IU] via SUBCUTANEOUS
  Administered 2022-10-25: 1 [IU] via SUBCUTANEOUS
  Administered 2022-10-27: 2 [IU] via SUBCUTANEOUS
  Administered 2022-10-27: 3 [IU] via SUBCUTANEOUS
  Administered 2022-10-27: 1 [IU] via SUBCUTANEOUS
  Administered 2022-10-28 (×2): 2 [IU] via SUBCUTANEOUS
  Administered 2022-10-29 (×2): 1 [IU] via SUBCUTANEOUS
  Administered 2022-10-30: 2 [IU] via SUBCUTANEOUS
  Administered 2022-10-30 (×2): 1 [IU] via SUBCUTANEOUS

## 2022-10-25 MED ORDER — MORPHINE SULFATE (PF) 2 MG/ML IV SOLN: 2.0000 mg | INTRAVENOUS | Status: DC | PRN

## 2022-10-25 MED ORDER — VANCOMYCIN VARIABLE DOSE PER UNSTABLE RENAL FUNCTION (PHARMACIST DOSING): Status: DC

## 2022-10-25 MED ORDER — HEPARIN SODIUM (PORCINE) 5000 UNIT/ML IJ SOLN
5000.0000 [IU] | Freq: Three times a day (TID) | INTRAMUSCULAR | Status: DC
  Administered 2022-10-25 – 2022-10-31 (×19): 5000 [IU] via SUBCUTANEOUS
  Filled 2022-10-25 (×19): qty 1

## 2022-10-25 MED ORDER — AMLODIPINE BESYLATE 5 MG PO TABS
5.0000 mg | ORAL_TABLET | Freq: Every day | ORAL | Status: DC
  Filled 2022-10-25: qty 1

## 2022-10-25 MED ORDER — LACTATED RINGERS IV SOLN
150.0000 mL/h | INTRAVENOUS | Status: DC
  Administered 2022-10-25: 150 mL/h via INTRAVENOUS

## 2022-10-25 MED ORDER — SODIUM CHLORIDE 0.9 % IV SOLN
1.0000 g | INTRAVENOUS | Status: DC
  Administered 2022-10-25 – 2022-10-29 (×5): 1 g via INTRAVENOUS
  Filled 2022-10-25 (×5): qty 10

## 2022-10-25 MED ORDER — MEDIHONEY WOUND/BURN DRESSING EX PSTE
1.0000 | PASTE | Freq: Every day | CUTANEOUS | Status: DC
  Administered 2022-10-25 – 2022-10-31 (×7): 1 via TOPICAL
  Filled 2022-10-25 (×3): qty 44

## 2022-10-25 MED ORDER — METOPROLOL SUCCINATE ER 50 MG PO TB24
50.0000 mg | ORAL_TABLET | Freq: Every day | ORAL | Status: DC
  Filled 2022-10-25: qty 1

## 2022-10-25 MED ORDER — DM-GUAIFENESIN ER 30-600 MG PO TB12
1.0000 | ORAL_TABLET | Freq: Two times a day (BID) | ORAL | Status: DC
  Administered 2022-10-25 – 2022-10-30 (×7): 1 via ORAL
  Filled 2022-10-25 (×8): qty 1

## 2022-10-25 MED ORDER — ATORVASTATIN CALCIUM 20 MG PO TABS
20.0000 mg | ORAL_TABLET | Freq: Every day | ORAL | Status: DC
  Administered 2022-10-27 – 2022-10-30 (×3): 20 mg via ORAL
  Filled 2022-10-25 (×5): qty 1

## 2022-10-25 MED ORDER — LACTATED RINGERS IV SOLN: INTRAVENOUS | Status: AC

## 2022-10-25 MED ORDER — ACETAMINOPHEN 650 MG RE SUPP: 650.0000 mg | Freq: Four times a day (QID) | RECTAL | Status: DC | PRN

## 2022-10-25 MED ORDER — CHLORHEXIDINE GLUCONATE CLOTH 2 % EX PADS
6.0000 | MEDICATED_PAD | Freq: Every day | CUTANEOUS | Status: DC
  Administered 2022-10-25 – 2022-10-30 (×6): 6 via TOPICAL

## 2022-10-25 MED ORDER — METOPROLOL TARTRATE 5 MG/5ML IV SOLN
5.0000 mg | Freq: Three times a day (TID) | INTRAVENOUS | Status: DC
  Administered 2022-10-25 – 2022-10-29 (×12): 5 mg via INTRAVENOUS
  Filled 2022-10-25 (×13): qty 5

## 2022-10-25 MED ORDER — INSULIN ASPART 100 UNIT/ML IJ SOLN: 0.0000 [IU] | Freq: Every day | INTRAMUSCULAR | Status: DC

## 2022-10-25 MED ORDER — ONDANSETRON HCL 4 MG/2ML IJ SOLN: 4.0000 mg | Freq: Four times a day (QID) | INTRAMUSCULAR | Status: DC | PRN

## 2022-10-25 MED ORDER — ONDANSETRON HCL 4 MG PO TABS: 4.0000 mg | ORAL_TABLET | Freq: Four times a day (QID) | ORAL | Status: DC | PRN

## 2022-10-25 MED ORDER — ACETAMINOPHEN 325 MG PO TABS: 650.0000 mg | ORAL_TABLET | Freq: Four times a day (QID) | ORAL | Status: DC | PRN

## 2022-10-25 MED ORDER — INSULIN ASPART 100 UNIT/ML IJ SOLN: 0.0000 [IU] | Freq: Three times a day (TID) | INTRAMUSCULAR | Status: DC

## 2022-10-25 MED ORDER — INSULIN ASPART 100 UNIT/ML IJ SOLN
0.0000 [IU] | Freq: Every day | INTRAMUSCULAR | Status: DC
  Administered 2022-10-27: 2 [IU] via SUBCUTANEOUS

## 2022-10-25 MED ORDER — OXYCODONE HCL 5 MG PO TABS
5.0000 mg | ORAL_TABLET | ORAL | Status: DC | PRN
  Administered 2022-10-25 – 2022-10-27 (×4): 5 mg via ORAL
  Filled 2022-10-25 (×4): qty 1

## 2022-10-25 MED ORDER — ORAL CARE MOUTH RINSE: 15.0000 mL | OROMUCOSAL | Status: DC | PRN

## 2022-10-25 NOTE — Assessment & Plan Note (Signed)
Continue Norvasc and metoprolol.

## 2022-10-25 NOTE — ED Notes (Signed)
Special Requests      BOTTLES DRAWN AEROBIC AND ANAEROBIC Blood Culture adequate volume Performed at [facility], [address] [place], [place] [address]    Culture PENDING    Report Status PENDING   Lactic acid, plasma     Status: Abnormal   Collection Time: 11/02/2022  7:50 PM  Result Value Ref Range   Lactic Acid, Venous 2.5 (HH) 0.5 - 1.9 mmol/L    Comment: CRITICAL VALUE NOTED.  VALUE IS CONSISTENT WITH PREVIOUSLY REPORTED AND CALLED VALUE. Performed at [facility], [address], [place], [place] [address]   Urinalysis, Routine w reflex microscopic -Urine, Clean Catch     Status: Abnormal   Collection Time: 10/12/2022 10:50 PM  Result Value Ref Range   Color, Urine YELLOW YELLOW   APPearance HAZY (A) CLEAR   Specific Gravity, Urine 1.016 1.005 - 1.030   pH 5.0 5.0 - 8.0   Glucose, UA 50 (A) NEGATIVE mg/dL   Hgb urine dipstick MODERATE (A) NEGATIVE   Bilirubin Urine NEGATIVE NEGATIVE   Ketones, ur 5 (A) NEGATIVE mg/dL   Protein, ur 30 (A) NEGATIVE mg/dL   Nitrite NEGATIVE NEGATIVE   Leukocytes,Ua NEGATIVE NEGATIVE   RBC / HPF 0-5 0 - 5 RBC/hpf   WBC, UA 0-5 0 - 5 WBC/hpf   Bacteria, UA RARE (A) NONE SEEN   Squamous Epithelial / HPF 0-5 0 - 5 /HPF   Hyaline Casts, UA PRESENT    Amorphous Crystal PRESENT     Comment: Performed at [facility], [address] [place], [place] [address]   CT CHEST ABDOMEN PELVIS WO CONTRAST  Result Date: 10/22/2022 CLINICAL DATA:  Trauma, found down, hypothermia, tachycardia EXAM: CT CHEST, ABDOMEN AND PELVIS WITHOUT CONTRAST TECHNIQUE: Multidetector CT imaging of the chest, abdomen and pelvis was performed following the standard protocol without IV contrast. RADIATION DOSE REDUCTION: This exam was performed according to the departmental dose-optimization program which includes automated exposure control, adjustment of the mA and/or kV according to patient size and/or use of  iterative reconstruction technique. COMPARISON:  None Available. FINDINGS: Motion degraded images. CT CHEST FINDINGS Cardiovascular: The heart is top-normal in size. No pericardial effusion. No evidence of thoracic aneurysm. Atherosclerotic calcifications of the aortic arch. Severe three-vessel coronary atherosclerosis. Postsurgical changes related to prior CABG. Mediastinum/Nodes: No suspicious mediastinal lymphadenopathy. Visualized thyroid is unremarkable. Lungs/Pleura: Scattered peribronchovascular nodularity in the upper lobes, measuring up to 4 mm in the right lung apex (series 3/image 40), likely infectious/inflammatory. Mild patchy right lower lobe opacity, likely atelectasis. No focal consolidation. No pleural effusion or pneumothorax. Musculoskeletal: Thoracic spine is within normal limits. Median sternotomy. Nondisplaced left lateral 4th through 8th rib fractures. CT ABDOMEN PELVIS FINDINGS Hepatobiliary: Unenhanced liver is unremarkable. Gallbladder is unremarkable. No intrahepatic or extrahepatic duct dilatation. Pancreas: Within normal limits. Spleen: Within normal limits Adrenals/Urinary Tract: Adrenal glands are within normal limits. 4.8 cm lateral right lower pole renal cyst (series 10/image 85), measuring fluid density, benign (Bosniak I). Left kidney is within normal limits. No hydronephrosis. Bladder is within normal limits. Stomach/Bowel: Stomach is within normal limits. No evidence of bowel obstruction. Normal appendix (series 2/image 90). No colonic wall thickening or inflammatory changes. Vascular/Lymphatic: No evidence of abdominal aortic aneurysm. Atherosclerotic calcifications of the abdominal aorta and branch vessels. No suspicious abdominopelvic lymphadenopathy. Reproductive: Prostate is unremarkable. Other: No abdominopelvic ascites. Musculoskeletal: Degenerative changes of the lumbar spine. No fracture is seen. IMPRESSION: Nondisplaced left lateral 4th through 8th rib fractures. No  pneumothorax. Scattered peribronchovascular  Special Requests      BOTTLES DRAWN AEROBIC AND ANAEROBIC Blood Culture adequate volume Performed at [facility], [address] [place], [place] [address]    Culture PENDING    Report Status PENDING   Lactic acid, plasma     Status: Abnormal   Collection Time: 11/02/2022  7:50 PM  Result Value Ref Range   Lactic Acid, Venous 2.5 (HH) 0.5 - 1.9 mmol/L    Comment: CRITICAL VALUE NOTED.  VALUE IS CONSISTENT WITH PREVIOUSLY REPORTED AND CALLED VALUE. Performed at [facility], [address], [place], [place] [address]   Urinalysis, Routine w reflex microscopic -Urine, Clean Catch     Status: Abnormal   Collection Time: 10/12/2022 10:50 PM  Result Value Ref Range   Color, Urine YELLOW YELLOW   APPearance HAZY (A) CLEAR   Specific Gravity, Urine 1.016 1.005 - 1.030   pH 5.0 5.0 - 8.0   Glucose, UA 50 (A) NEGATIVE mg/dL   Hgb urine dipstick MODERATE (A) NEGATIVE   Bilirubin Urine NEGATIVE NEGATIVE   Ketones, ur 5 (A) NEGATIVE mg/dL   Protein, ur 30 (A) NEGATIVE mg/dL   Nitrite NEGATIVE NEGATIVE   Leukocytes,Ua NEGATIVE NEGATIVE   RBC / HPF 0-5 0 - 5 RBC/hpf   WBC, UA 0-5 0 - 5 WBC/hpf   Bacteria, UA RARE (A) NONE SEEN   Squamous Epithelial / HPF 0-5 0 - 5 /HPF   Hyaline Casts, UA PRESENT    Amorphous Crystal PRESENT     Comment: Performed at [facility], [address] [place], [place] [address]   CT CHEST ABDOMEN PELVIS WO CONTRAST  Result Date: 10/22/2022 CLINICAL DATA:  Trauma, found down, hypothermia, tachycardia EXAM: CT CHEST, ABDOMEN AND PELVIS WITHOUT CONTRAST TECHNIQUE: Multidetector CT imaging of the chest, abdomen and pelvis was performed following the standard protocol without IV contrast. RADIATION DOSE REDUCTION: This exam was performed according to the departmental dose-optimization program which includes automated exposure control, adjustment of the mA and/or kV according to patient size and/or use of  iterative reconstruction technique. COMPARISON:  None Available. FINDINGS: Motion degraded images. CT CHEST FINDINGS Cardiovascular: The heart is top-normal in size. No pericardial effusion. No evidence of thoracic aneurysm. Atherosclerotic calcifications of the aortic arch. Severe three-vessel coronary atherosclerosis. Postsurgical changes related to prior CABG. Mediastinum/Nodes: No suspicious mediastinal lymphadenopathy. Visualized thyroid is unremarkable. Lungs/Pleura: Scattered peribronchovascular nodularity in the upper lobes, measuring up to 4 mm in the right lung apex (series 3/image 40), likely infectious/inflammatory. Mild patchy right lower lobe opacity, likely atelectasis. No focal consolidation. No pleural effusion or pneumothorax. Musculoskeletal: Thoracic spine is within normal limits. Median sternotomy. Nondisplaced left lateral 4th through 8th rib fractures. CT ABDOMEN PELVIS FINDINGS Hepatobiliary: Unenhanced liver is unremarkable. Gallbladder is unremarkable. No intrahepatic or extrahepatic duct dilatation. Pancreas: Within normal limits. Spleen: Within normal limits Adrenals/Urinary Tract: Adrenal glands are within normal limits. 4.8 cm lateral right lower pole renal cyst (series 10/image 85), measuring fluid density, benign (Bosniak I). Left kidney is within normal limits. No hydronephrosis. Bladder is within normal limits. Stomach/Bowel: Stomach is within normal limits. No evidence of bowel obstruction. Normal appendix (series 2/image 90). No colonic wall thickening or inflammatory changes. Vascular/Lymphatic: No evidence of abdominal aortic aneurysm. Atherosclerotic calcifications of the abdominal aorta and branch vessels. No suspicious abdominopelvic lymphadenopathy. Reproductive: Prostate is unremarkable. Other: No abdominopelvic ascites. Musculoskeletal: Degenerative changes of the lumbar spine. No fracture is seen. IMPRESSION: Nondisplaced left lateral 4th through 8th rib fractures. No  pneumothorax. Scattered peribronchovascular  Special Requests      BOTTLES DRAWN AEROBIC AND ANAEROBIC Blood Culture adequate volume Performed at [facility], [address] [place], [place] [address]    Culture PENDING    Report Status PENDING   Lactic acid, plasma     Status: Abnormal   Collection Time: 11/02/2022  7:50 PM  Result Value Ref Range   Lactic Acid, Venous 2.5 (HH) 0.5 - 1.9 mmol/L    Comment: CRITICAL VALUE NOTED.  VALUE IS CONSISTENT WITH PREVIOUSLY REPORTED AND CALLED VALUE. Performed at [facility], [address], [place], [place] [address]   Urinalysis, Routine w reflex microscopic -Urine, Clean Catch     Status: Abnormal   Collection Time: 10/12/2022 10:50 PM  Result Value Ref Range   Color, Urine YELLOW YELLOW   APPearance HAZY (A) CLEAR   Specific Gravity, Urine 1.016 1.005 - 1.030   pH 5.0 5.0 - 8.0   Glucose, UA 50 (A) NEGATIVE mg/dL   Hgb urine dipstick MODERATE (A) NEGATIVE   Bilirubin Urine NEGATIVE NEGATIVE   Ketones, ur 5 (A) NEGATIVE mg/dL   Protein, ur 30 (A) NEGATIVE mg/dL   Nitrite NEGATIVE NEGATIVE   Leukocytes,Ua NEGATIVE NEGATIVE   RBC / HPF 0-5 0 - 5 RBC/hpf   WBC, UA 0-5 0 - 5 WBC/hpf   Bacteria, UA RARE (A) NONE SEEN   Squamous Epithelial / HPF 0-5 0 - 5 /HPF   Hyaline Casts, UA PRESENT    Amorphous Crystal PRESENT     Comment: Performed at [facility], [address] [place], [place] [address]   CT CHEST ABDOMEN PELVIS WO CONTRAST  Result Date: 10/22/2022 CLINICAL DATA:  Trauma, found down, hypothermia, tachycardia EXAM: CT CHEST, ABDOMEN AND PELVIS WITHOUT CONTRAST TECHNIQUE: Multidetector CT imaging of the chest, abdomen and pelvis was performed following the standard protocol without IV contrast. RADIATION DOSE REDUCTION: This exam was performed according to the departmental dose-optimization program which includes automated exposure control, adjustment of the mA and/or kV according to patient size and/or use of  iterative reconstruction technique. COMPARISON:  None Available. FINDINGS: Motion degraded images. CT CHEST FINDINGS Cardiovascular: The heart is top-normal in size. No pericardial effusion. No evidence of thoracic aneurysm. Atherosclerotic calcifications of the aortic arch. Severe three-vessel coronary atherosclerosis. Postsurgical changes related to prior CABG. Mediastinum/Nodes: No suspicious mediastinal lymphadenopathy. Visualized thyroid is unremarkable. Lungs/Pleura: Scattered peribronchovascular nodularity in the upper lobes, measuring up to 4 mm in the right lung apex (series 3/image 40), likely infectious/inflammatory. Mild patchy right lower lobe opacity, likely atelectasis. No focal consolidation. No pleural effusion or pneumothorax. Musculoskeletal: Thoracic spine is within normal limits. Median sternotomy. Nondisplaced left lateral 4th through 8th rib fractures. CT ABDOMEN PELVIS FINDINGS Hepatobiliary: Unenhanced liver is unremarkable. Gallbladder is unremarkable. No intrahepatic or extrahepatic duct dilatation. Pancreas: Within normal limits. Spleen: Within normal limits Adrenals/Urinary Tract: Adrenal glands are within normal limits. 4.8 cm lateral right lower pole renal cyst (series 10/image 85), measuring fluid density, benign (Bosniak I). Left kidney is within normal limits. No hydronephrosis. Bladder is within normal limits. Stomach/Bowel: Stomach is within normal limits. No evidence of bowel obstruction. Normal appendix (series 2/image 90). No colonic wall thickening or inflammatory changes. Vascular/Lymphatic: No evidence of abdominal aortic aneurysm. Atherosclerotic calcifications of the abdominal aorta and branch vessels. No suspicious abdominopelvic lymphadenopathy. Reproductive: Prostate is unremarkable. Other: No abdominopelvic ascites. Musculoskeletal: Degenerative changes of the lumbar spine. No fracture is seen. IMPRESSION: Nondisplaced left lateral 4th through 8th rib fractures. No  pneumothorax. Scattered peribronchovascular  Special Requests      BOTTLES DRAWN AEROBIC AND ANAEROBIC Blood Culture adequate volume Performed at [facility], [address] [place], [place] [address]    Culture PENDING    Report Status PENDING   Lactic acid, plasma     Status: Abnormal   Collection Time: 11/02/2022  7:50 PM  Result Value Ref Range   Lactic Acid, Venous 2.5 (HH) 0.5 - 1.9 mmol/L    Comment: CRITICAL VALUE NOTED.  VALUE IS CONSISTENT WITH PREVIOUSLY REPORTED AND CALLED VALUE. Performed at [facility], [address], [place], [place] [address]   Urinalysis, Routine w reflex microscopic -Urine, Clean Catch     Status: Abnormal   Collection Time: 10/12/2022 10:50 PM  Result Value Ref Range   Color, Urine YELLOW YELLOW   APPearance HAZY (A) CLEAR   Specific Gravity, Urine 1.016 1.005 - 1.030   pH 5.0 5.0 - 8.0   Glucose, UA 50 (A) NEGATIVE mg/dL   Hgb urine dipstick MODERATE (A) NEGATIVE   Bilirubin Urine NEGATIVE NEGATIVE   Ketones, ur 5 (A) NEGATIVE mg/dL   Protein, ur 30 (A) NEGATIVE mg/dL   Nitrite NEGATIVE NEGATIVE   Leukocytes,Ua NEGATIVE NEGATIVE   RBC / HPF 0-5 0 - 5 RBC/hpf   WBC, UA 0-5 0 - 5 WBC/hpf   Bacteria, UA RARE (A) NONE SEEN   Squamous Epithelial / HPF 0-5 0 - 5 /HPF   Hyaline Casts, UA PRESENT    Amorphous Crystal PRESENT     Comment: Performed at [facility], [address] [place], [place] [address]   CT CHEST ABDOMEN PELVIS WO CONTRAST  Result Date: 10/22/2022 CLINICAL DATA:  Trauma, found down, hypothermia, tachycardia EXAM: CT CHEST, ABDOMEN AND PELVIS WITHOUT CONTRAST TECHNIQUE: Multidetector CT imaging of the chest, abdomen and pelvis was performed following the standard protocol without IV contrast. RADIATION DOSE REDUCTION: This exam was performed according to the departmental dose-optimization program which includes automated exposure control, adjustment of the mA and/or kV according to patient size and/or use of  iterative reconstruction technique. COMPARISON:  None Available. FINDINGS: Motion degraded images. CT CHEST FINDINGS Cardiovascular: The heart is top-normal in size. No pericardial effusion. No evidence of thoracic aneurysm. Atherosclerotic calcifications of the aortic arch. Severe three-vessel coronary atherosclerosis. Postsurgical changes related to prior CABG. Mediastinum/Nodes: No suspicious mediastinal lymphadenopathy. Visualized thyroid is unremarkable. Lungs/Pleura: Scattered peribronchovascular nodularity in the upper lobes, measuring up to 4 mm in the right lung apex (series 3/image 40), likely infectious/inflammatory. Mild patchy right lower lobe opacity, likely atelectasis. No focal consolidation. No pleural effusion or pneumothorax. Musculoskeletal: Thoracic spine is within normal limits. Median sternotomy. Nondisplaced left lateral 4th through 8th rib fractures. CT ABDOMEN PELVIS FINDINGS Hepatobiliary: Unenhanced liver is unremarkable. Gallbladder is unremarkable. No intrahepatic or extrahepatic duct dilatation. Pancreas: Within normal limits. Spleen: Within normal limits Adrenals/Urinary Tract: Adrenal glands are within normal limits. 4.8 cm lateral right lower pole renal cyst (series 10/image 85), measuring fluid density, benign (Bosniak I). Left kidney is within normal limits. No hydronephrosis. Bladder is within normal limits. Stomach/Bowel: Stomach is within normal limits. No evidence of bowel obstruction. Normal appendix (series 2/image 90). No colonic wall thickening or inflammatory changes. Vascular/Lymphatic: No evidence of abdominal aortic aneurysm. Atherosclerotic calcifications of the abdominal aorta and branch vessels. No suspicious abdominopelvic lymphadenopathy. Reproductive: Prostate is unremarkable. Other: No abdominopelvic ascites. Musculoskeletal: Degenerative changes of the lumbar spine. No fracture is seen. IMPRESSION: Nondisplaced left lateral 4th through 8th rib fractures. No  pneumothorax. Scattered peribronchovascular  Special Requests      BOTTLES DRAWN AEROBIC AND ANAEROBIC Blood Culture adequate volume Performed at [facility], [address] [place], [place] [address]    Culture PENDING    Report Status PENDING   Lactic acid, plasma     Status: Abnormal   Collection Time: 11/02/2022  7:50 PM  Result Value Ref Range   Lactic Acid, Venous 2.5 (HH) 0.5 - 1.9 mmol/L    Comment: CRITICAL VALUE NOTED.  VALUE IS CONSISTENT WITH PREVIOUSLY REPORTED AND CALLED VALUE. Performed at [facility], [address], [place], [place] [address]   Urinalysis, Routine w reflex microscopic -Urine, Clean Catch     Status: Abnormal   Collection Time: 10/12/2022 10:50 PM  Result Value Ref Range   Color, Urine YELLOW YELLOW   APPearance HAZY (A) CLEAR   Specific Gravity, Urine 1.016 1.005 - 1.030   pH 5.0 5.0 - 8.0   Glucose, UA 50 (A) NEGATIVE mg/dL   Hgb urine dipstick MODERATE (A) NEGATIVE   Bilirubin Urine NEGATIVE NEGATIVE   Ketones, ur 5 (A) NEGATIVE mg/dL   Protein, ur 30 (A) NEGATIVE mg/dL   Nitrite NEGATIVE NEGATIVE   Leukocytes,Ua NEGATIVE NEGATIVE   RBC / HPF 0-5 0 - 5 RBC/hpf   WBC, UA 0-5 0 - 5 WBC/hpf   Bacteria, UA RARE (A) NONE SEEN   Squamous Epithelial / HPF 0-5 0 - 5 /HPF   Hyaline Casts, UA PRESENT    Amorphous Crystal PRESENT     Comment: Performed at [facility], [address] [place], [place] [address]   CT CHEST ABDOMEN PELVIS WO CONTRAST  Result Date: 10/22/2022 CLINICAL DATA:  Trauma, found down, hypothermia, tachycardia EXAM: CT CHEST, ABDOMEN AND PELVIS WITHOUT CONTRAST TECHNIQUE: Multidetector CT imaging of the chest, abdomen and pelvis was performed following the standard protocol without IV contrast. RADIATION DOSE REDUCTION: This exam was performed according to the departmental dose-optimization program which includes automated exposure control, adjustment of the mA and/or kV according to patient size and/or use of  iterative reconstruction technique. COMPARISON:  None Available. FINDINGS: Motion degraded images. CT CHEST FINDINGS Cardiovascular: The heart is top-normal in size. No pericardial effusion. No evidence of thoracic aneurysm. Atherosclerotic calcifications of the aortic arch. Severe three-vessel coronary atherosclerosis. Postsurgical changes related to prior CABG. Mediastinum/Nodes: No suspicious mediastinal lymphadenopathy. Visualized thyroid is unremarkable. Lungs/Pleura: Scattered peribronchovascular nodularity in the upper lobes, measuring up to 4 mm in the right lung apex (series 3/image 40), likely infectious/inflammatory. Mild patchy right lower lobe opacity, likely atelectasis. No focal consolidation. No pleural effusion or pneumothorax. Musculoskeletal: Thoracic spine is within normal limits. Median sternotomy. Nondisplaced left lateral 4th through 8th rib fractures. CT ABDOMEN PELVIS FINDINGS Hepatobiliary: Unenhanced liver is unremarkable. Gallbladder is unremarkable. No intrahepatic or extrahepatic duct dilatation. Pancreas: Within normal limits. Spleen: Within normal limits Adrenals/Urinary Tract: Adrenal glands are within normal limits. 4.8 cm lateral right lower pole renal cyst (series 10/image 85), measuring fluid density, benign (Bosniak I). Left kidney is within normal limits. No hydronephrosis. Bladder is within normal limits. Stomach/Bowel: Stomach is within normal limits. No evidence of bowel obstruction. Normal appendix (series 2/image 90). No colonic wall thickening or inflammatory changes. Vascular/Lymphatic: No evidence of abdominal aortic aneurysm. Atherosclerotic calcifications of the abdominal aorta and branch vessels. No suspicious abdominopelvic lymphadenopathy. Reproductive: Prostate is unremarkable. Other: No abdominopelvic ascites. Musculoskeletal: Degenerative changes of the lumbar spine. No fracture is seen. IMPRESSION: Nondisplaced left lateral 4th through 8th rib fractures. No  pneumothorax. Scattered peribronchovascular  well-seated in the respective acetabula. Prominent vascular calcifications. IMPRESSION: No fracture of the pelvis. Electronically Signed   By: Narda Rutherford M.D.   On: 10/18/2022 19:37   DG Knee Complete 4 Views Right  Result Date: 10/20/2022 CLINICAL DATA:  Right knee pain and swelling after fall. EXAM: RIGHT KNEE - COMPLETE 4+ VIEW COMPARISON:  Radiograph 09/10/2015 FINDINGS: The bones are subjectively under mineralized. No acute fracture or dislocation. No significant knee joint effusion. Moderate tricompartmental osteoarthritis. There are vascular calcifications. Scattered small soft tissue clips. Generalized soft tissue edema. IMPRESSION: 1. No acute fracture or dislocation of the right knee. 2. Moderate tricompartmental osteoarthritis. Electronically Signed   By: Narda Rutherford M.D.   On: 10/21/2022 19:36    Pending Labs Unresulted Labs (From admission, onward)     Start     Ordered   10/25/22 0500  CK  Tomorrow morning,   R        10/23/2022 2329   10/25/22 0100  Lactic acid, plasma  (Lactic Acid)  Once,   R        10/18/2022 2329   10/21/2022 2326  SARS Coronavirus 2 by RT PCR (hospital order, performed in [facility] hospital lab) *cepheid single result test* Anterior Nasal Swab  (SARS Coronavirus 2 by RT PCR (hospital order, performed in [facility] hospital lab) *cepheid single result test*)  Once,   URGENT        11/04/2022 2326   Signed and Held  Hemoglobin A1c  (Glycemic Control (SSI)  Q 4 Hours / Glycemic Control (SSI)  AC +/- HS)  Tomorrow morning,   R       Comments: To  assess prior glycemic control    Signed and Held   Signed and Held  Comprehensive metabolic panel  Tomorrow morning,   R        Signed and Held   Signed and Held  Magnesium  Tomorrow morning,   R        Signed and Held   Signed and Held  CBC with Differential/Platelet  Tomorrow morning,   R        Signed and Held   Signed and Held  Expectorated Sputum Assessment w Gram Stain, Rflx to [employer]  Once,   R        Signed and Held   Signed and Held  Procalcitonin  Tomorrow morning,   R       References:    Procalcitonin Lower Respiratory Tract Infection AND Sepsis Procalcitonin Algorithm   Signed and Held   Signed and Held  Strep pneumoniae urinary antigen  Once,   R        Signed and Held   Signed and Held  Legionella Pneumophila Serogp 1 Ur Ag  Once,   R        Signed and Held            Vitals/Pain Today's Vitals   10/25/22 0000 10/25/22 0006 10/25/22 0015 10/25/22 0030  BP: 138/72  133/65 139/64  Pulse: (!) 117 (!) 115 (!) 117 (!) 117  Resp: (!) 27 (!) 24 (!) 30 (!) 30  Temp:      TempSrc:      SpO2: 93% 93% 92% 92%  Weight:      Height:      PainSc:        Isolation Precautions Airborne and Contact precautions  Medications Medications  vancomycin variable dose per unstable renal function (pharmacist dosing) (has no

## 2022-10-25 NOTE — H&P (Signed)
History and Physical    Patient: Victor Tapia. ZOX:[id number] DOB: 1926-03-31 DOA: 10/20/2022 DOS: the patient was seen and examined on 10/25/2022 PCP: Mechele Claude, MD  Patient coming from: Home  Chief Complaint:  Chief Complaint  Patient presents with   Altered Mental Status   Fall   HPI: Victor Tapia. is a 89 y.o. male with medical history significant of coronary artery disease, diabetes mellitus type 2, hyperlipidemia, hypertension, prostate cancer, presents the ED with a chief complaint of fall.  Sister and niece are at bedside.  Sister is attempting to be a historian, but is a poor historian.  She talks a lot about what she guesses happened, but does not actually know what happened.  She reports last time she talked to him was Thursday night.  She thinks his son talk to him sometime on Friday.  Patient was then found today, down on the cement floor in the garage.  He has pressure injuries from lying there and also friction injuries from rubbing his feet on the floor trying to get up.  Sister does report that when his son came in to visit him the whole family ended up with the flu.  He had a fever, headache, generalized weakness.  He was not coughing.  Those symptoms started about 2 weeks ago when he was getting better.  Patient lives alone because his wife died 6 weeks ago.  His daughter was staying with him for a while though and then his son stayed with him so he is actually been alone very little.  Patient is not able to give any history at this time.  Daughter in [place] is the POA and is discussing with other siblings to determine a CODE STATUS.  As far as anyone knows patient does not smoke or drink. Review of Systems: unable to review all systems due to the inability of the patient to answer questions. Past Medical History:  Diagnosis Date   CAD (coronary artery disease)    Diabetes mellitus    Hyperlipidemia    Hypertension    OA (osteoarthritis)    MULTIPLE SITES     Prostate cancer (HCC) 1999   HORMONE TREATMENT    Past Surgical History:  Procedure Laterality Date   CATARACT  SURGERY RIGHT EYE  4/10   CATARACT EXTRACTION W/PHACO Left 12/09/2016   Procedure: CATARACT EXTRACTION PHACO AND INTRAOCULAR LENS PLACEMENT LEFT EYE CDE=12.57;  Surgeon: Fabio Pierce, MD;  Location: [facility];  Service: Ophthalmology;  Laterality: Left;  left   KNEE SURGERY Right    1965   TRIPLE BY PASS  1999   Social History:  reports that he quit smoking about 59 years ago. His smoking use included cigarettes. He started smoking about 69 years ago. He has a 10 pack-year smoking history. He has never used smokeless tobacco. He reports current alcohol use of about 3.0 standard drinks of alcohol per week. He reports that he does not use drugs.  No Known Allergies  Family History  Problem Relation Age of Onset   Cancer Sister        lymphoma   Cancer Brother        lunch    Prior to Admission medications   Medication Sig Start Date End Date Taking? Authorizing Provider  amLODipine (NORVASC) 5 MG tablet Take 1 tablet (5 mg total) by mouth daily. 01/17/22  Yes Mechele Claude, MD  atorvastatin (LIPITOR) 20 MG tablet TAKE 1 TABLET DAILY AT 6 P.M. 09/21/22  Yes  Mechele Claude, MD  glimepiride (AMARYL) 4 MG tablet Take 1 tablet (4 mg total) by mouth daily with breakfast. 09/21/22  Yes Stacks, Broadus John, MD  linagliptin (TRADJENTA) 5 MG TABS tablet TAKE 1 TABLET DAILY FOR DIABETES 09/21/22  Yes Mechele Claude, MD  metoprolol succinate (TOPROL-XL) 50 MG 24 hr tablet Take 1 tablet (50 mg total) by mouth daily. For high blood pressure 09/21/22  Yes Stacks, Broadus John, MD  aspirin 325 MG tablet Take 325 mg by mouth daily. Patient not taking: Reported on 11-21-2022    [provider]  ciprofloxacin (CIPRO) 500 MG tablet Take 1 tablet (500 mg total) by mouth 2 (two) times daily. Patient not taking: Reported on 2022-11-21 09/21/22   Mechele Claude, MD  ibuprofen (ADVIL) 800 MG tablet Take 800  mg by mouth every 8 (eight) hours as needed. Patient not taking: Reported on Nov 21, 2022 09/11/20   [provider]  Multiple Vitamins-Minerals (MENS 50+ MULTI VITAMIN/MIN PO) Take 1 tablet by mouth daily.  Patient not taking: Reported on 2022-11-21    [provider]  predniSONE (DELTASONE) 20 MG tablet 2 po at same time daily for 5 days Patient not taking: Reported on 21-Nov-2022 07/01/22   Dettinger, Elige Radon, MD  tamsulosin (FLOMAX) 0.4 MG CAPS capsule TAKE 2 CAPSULES AT BEDTIME FOR URINE FLOW AND PROSTATE Patient not taking: Reported on 11-21-22 09/21/22   Mechele Claude, MD    Physical Exam: Vitals:   10/25/22 0130 10/25/22 0214 10/25/22 0302 10/25/22 0400  BP: (!) 156/65 (!) 153/58 (!) 158/62 (!) 150/65  Pulse: (!) 118 (!) 111 (!) 120 (!) 116  Resp: (!) 34 (!) 26 (!) 26 (!) 22  Temp:  99.4 F (37.4 C)    TempSrc:  Axillary    SpO2: 93% 91% 95% 93%  Weight:    74.8 kg  Height:    5\' 9"  (1.753 m)   1.  General: Patient lying supine in bed, very dry appearing  2. Psychiatric: Somnolent and disoriented but family reports that he was more oriented but family reports he was more oriented prior to medication given in the ED   3. Neurologic: Speech is slurred likely due to drowsiness, patient moves all 4 extremities voluntarily   4. HEENMT:  Head is atraumatic, normocephalic, pupils reactive to light, neck is supple, trachea is midline, mucous membranes are dry   5. Respiratory : Lungs are clear to auscultation bilaterally without wheezing, rhonchi, rales, no cyanosis, no increase in work of breathing or accessory muscle use   6. Cardiovascular : Heart rate normal, rhythm is regular, no murmurs, rubs or gallops, no peripheral edema, peripheral pulses palpated   7. Gastrointestinal:  Abdomen is soft, nondistended, nontender to palpation bowel sounds active, no masses or organomegaly palpated   8. Skin:  Multiple pressure injuries and some bruising    9.Musculoskeletal:  no asymmetry in tone, no peripheral edema, peripheral pulses palpated, no tenderness to palpation in the extremities  Data Reviewed: In the ED Temp 94.5-96, heart rate 105-102.6, respiratory rate 17-42, blood pressure 133/62-189/98, satting 91-95% leukocytosis at 35.6, hemoglobin hemoconcentrated likely at 14.4 Chemistry hyperkalemia, AKI, rhabdomyolysis, decreased albumin  Lactic acid 4.2-4.25  UA is not negative UTI  Blood culture pending  CT head and C-spine showed no traumatic injury to the C-spine and no intracranial abnormality  CT chest shows broken ribs and pneumonia Patient received vancomycin, cefepime in the ED He received fluids in the ED Admission requested for sepsis   Assessment and Plan: *  Sepsis due to pneumonia (HCC) - With hypothermia, heart rate of 126, respiratory rate 42, leukocytosis at 35.6, lactic acid 4.2 - UA is not negative UTI - Chest x-ray shows nondisplaced left 4th through 8th rib fractures and right upper lobe infectious/inflammatory changes - Incentive spirometer - Continue Rocephin and Zithromax - Patient also received cefepime and vancomycin in the ED for sepsis protocol - Expectorated sputum culture - Urine antigens for strep and Legionella - Blood cultures pending - Continue to monitor  AKI (acute kidney injury) (HCC) - Creatinine at baseline 1.4, creatinine today 4.93 - Secondary to dehydration and rhabdomyolysis - Continue IV hydration - Trend CPK in the a.m. - Trend creatinine in the a.m.  Pressure injury of skin - Consult wound care - Likely from being down on cement floor for 3 days  Rhabdomyolysis Found down for an unknown amount of time possibly 3 days - CPK was 2153 - Associated AKI at 4.93 - Patient received 2 L of LR - Continue LR at 150 mL/h - Continue to monitor  Essential hypertension, benign - Continue Norvasc and metoprolol  Hyperlipemia - Continue statin  Type 2 diabetes mellitus with  stage 3 chronic kidney disease, without long-term current use of insulin (HCC) - Hold Amaryl and Tradjenta - Sliding scale coverage - Hyperglycemic in the ED at 223 - Continue to monitor CBG      Advance Care Planning:   Code Status: Full Code  Consults: None at this time  Family Communication: Sister and niece at bedside   Severity of Illness: The appropriate patient status for this patient is INPATIENT. Inpatient status is judged to be reasonable and necessary in order to provide the required intensity of service to ensure the patient's safety. The patient's presenting symptoms, physical exam findings, and initial radiographic and laboratory data in the context of their chronic comorbidities is felt to place them at high risk for further clinical deterioration. Furthermore, it is not anticipated that the patient will be medically stable for discharge from the hospital within 2 midnights of admission.   * I certify that at the point of admission it is my clinical judgment that the patient will require inpatient hospital care spanning beyond 2 midnights from the point of admission due to high intensity of service, high risk for further deterioration and high frequency of surveillance required.*  Author: Lilyan Gilford, DO 10/25/2022 4:39 AM  For on call review [email or web address].

## 2022-10-25 NOTE — Progress Notes (Signed)
BP increasing over last couple of checks, MD aware.

## 2022-10-25 NOTE — Assessment & Plan Note (Addendum)
-   With hypothermia, heart rate of 126, respiratory rate 42, leukocytosis at 35.6, lactic acid 4.2 - UA is not negative UTI - Chest x-ray shows nondisplaced left 4th through 8th rib fractures and right upper lobe infectious/inflammatory changes - Incentive spirometer - Continue Rocephin and Zithromax - Patient also received cefepime and vancomycin in the ED for sepsis protocol - Expectorated sputum culture - Urine antigens for strep and Legionella - Blood cultures pending - Continue to monitor

## 2022-10-25 NOTE — Assessment & Plan Note (Signed)
-   Creatinine at baseline 1.4, creatinine today 4.93 - Secondary to dehydration and rhabdomyolysis - Continue IV hydration - Trend CPK in the a.m. - Trend creatinine in the a.m.

## 2022-10-25 NOTE — TOC Initial Note (Signed)
Transition of Care (TOC) - Initial/Assessment Note    Patient Details  Name: Victor Tapia. MRN: [id number] Date of Birth: [birth year]  Transition of Care [facility]) CM/SW Contact:    Elliot Gault, LCSW Phone Number: 10/25/2022, 2:27 PM  Clinical Narrative:                  Pt admitted from home. He has a high readmission risk score. Pt not oriented at this time. TOC unable to assess except through chart review and discussion with Palliative APNP.  TOC will follow and further assess and assist as able.   Barriers to Discharge: Continued Medical Work up   Patient Goals and CMS Choice            Expected Discharge Plan and Services In-house Referral: Clinical Social Work                                            Prior Living Arrangements/Services   Lives with:: Self                   Activities of Daily Living      Permission Sought/Granted                  Emotional Assessment       Orientation: : Oriented to Self Alcohol / Substance Use: Not Applicable Psych Involvement: No (comment)  Admission diagnosis:  AKI (acute kidney injury) (HCC) [N17.9] Closed fracture of multiple ribs, unspecified laterality, initial encounter [S22.49XA] Traumatic rhabdomyolysis, initial encounter (HCC) [T79.6XXA] Sepsis due to pneumonia (HCC) [J18.9, A41.9] Aspiration pneumonia, unspecified aspiration pneumonia type, unspecified laterality, unspecified part of lung (HCC) [J69.0] Patient Active Problem List   Diagnosis Date Noted   Rhabdomyolysis 10/25/2022   Pressure injury of skin 10/25/2022   AKI (acute kidney injury) (HCC) 10/25/2022   Hyperkalemia 10/25/2022   Sepsis due to pneumonia (HCC) 10/15/2022   Prostate CA (HCC) 12/08/2015   Type 2 diabetes mellitus with stage 3 chronic kidney disease, without long-term current use of insulin (HCC) 10/28/2014   Hyperlipemia 10/28/2014   Essential hypertension, benign 10/28/2014   Heel spur 10/28/2014    PCP:  Mechele Claude, MD Pharmacy:   [facility] - [address] [place] - [address] [place] [address] Phone: [phone] Fax: [phone]  [facility] - [place], [place] - [address] [place] [address] Phone: [phone] Fax: [phone]  [id number] - [place], [place] - [address] [place] [address] Phone: [phone] Fax: [phone]     Social Determinants of Health (SDOH) Social History: SDOH Screenings   Food Insecurity: No Food Insecurity (02/21/2022)  Housing: Low Risk  (02/21/2022)  Transportation Needs: No Transportation Needs (02/21/2022)  Utilities: Not At Risk (02/21/2022)  Alcohol Screen: Low Risk  (02/21/2022)  Depression (PHQ2-9): Low Risk  (09/21/2022)  Financial Resource Strain: Low Risk  (02/21/2022)  Physical Activity: Insufficiently Active (02/21/2022)  Social Connections: Socially Integrated (02/21/2022)  Stress: No Stress Concern Present (02/21/2022)  Tobacco Use: Medium Risk (11/06/2022)   SDOH Interventions:     Readmission Risk Interventions    10/25/2022    2:27 PM  Readmission Risk Prevention Plan  Transportation Screening Complete  Home Care Screening Complete  Medication Review (RN CM) Complete

## 2022-10-25 NOTE — Progress Notes (Signed)
Restraint order discontinued, restraints were not in use when patient arrived to ICU from ED.

## 2022-10-25 NOTE — ED Notes (Signed)
Pt taken out of soft bilateral wrist restraints

## 2022-10-25 NOTE — Assessment & Plan Note (Signed)
Found down for an unknown amount of time possibly 3 days - CPK was 2153 - Associated AKI at 4.93 - Patient received 2 L of LR - Continue LR at 150 mL/h - Continue to monitor

## 2022-10-25 NOTE — Assessment & Plan Note (Signed)
-   Consult wound care - Likely from being down on cement floor for 3 days

## 2022-10-25 NOTE — Consult Note (Signed)
Consultation Note Date: 10/25/2022   Patient Name: Victor Tapia.  DOB: 1926-06-21  MRN: [id number]  Age / Sex: 89 y.o., male  PCP: Mechele Claude, MD Referring Physician: Vassie Loll, MD  Reason for Consultation: Establishing goals of care  HPI/Patient Profile: 89 y.o. male  with past medical history of CAD, DM 2, HTN/HLD, prostate cancer admitted on 10/13/2022 with sepsis due to pneumonia, acute kidney injury, pressure injury of the skin, rhabdomyolysis.   Clinical Assessment and Goals of Care: I have reviewed medical records including EPIC notes, labs and imaging, received report from RN, assessed the patient.  Victor Tapia is lying quietly in bed.  He appears acutely/chronically ill and somewhat frail.  He will briefly open his eyes when asked but not keep eye contact.  He is able to tell me his name, and that after some prompting is able to state that we are in the hospital.  I do not believe that he can make his basic needs known.  His sister, Victor Tapia, is present at bedside.  She tells me that Victor Tapia was present yesterday.  Victor Tapia tells me that Victor Tapia wife, Victor Tapia, died approximately 6 weeks ago with hospice care.  She shares that prior to this illness Victor Tapia had been independent with ADLs/IADLs.  She shares that his son Victor Tapia visited from [place].  Victor Tapia contracted the flu and saw urgent care for treatment.  She states that Victor Tapia spent 2 weeks here in [place] before returning to [place].  She shares that he has been hospitalized with pneumonia for the last 3 days.  Victor Tapia states that Victor Tapia also seem to have the flu spent a lot of time sleeping on the couch.  She states that he did not see any doctors during this acute illness.  Victor Tapia shares that [name] grew up in [place], the oldest of 15 children, 12 are still living.  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  His 2 daughters Victor Tapia and Victor Tapia still live in [place].   Reportedly Victor Tapia has recently had back surgery.  Victor Tapia states that she believes Victor Tapia is healthcare power of attorney.  Call to daughter, Victor Tapia  to discuss diagnosis prognosis, GOC, EOL wishes, disposition and options. I introduced Palliative Medicine as specialized medical care for people living with serious illness. It focuses on providing relief from the symptoms and stress of a serious illness. The goal is to improve quality of life for both the patient and the family.  We discussed a brief life review of the patient. Victor Tapia verifies as above, except Victor Tapia had hip replacement.   We then focused on their current illness.  We talk in detail about sepsis/pneumonia and the treatment plan.  We also talk about rhabdo myelosis and the treatment plan.  We talk about acute kidney injury and the treatment plan.  We reviewed labs in detail related to kidney function, white blood cells, etc.  We talked about the treatment plan in detail including antibiotics and other medications.  We also talk about Mr.  Tapia's multiple wounds.  The natural disease trajectory and expectations at EOL were discussed.  Advanced directives, concepts specific to code status, artifical feeding and hydration, and rehospitalization were considered and discussed.  Victor Tapia shares that her father has never discussed end-of-life issues.  She shares that he is a "tough old coot" who thinks he will "live forever".  She shares that they have not discussed CODE STATUS.  We talked about the concept of "treat the treatable, but allow a natural passing".  Victor Tapia states that she will have these discussions with her siblings.  I share that I will have these discussions with Victor Tapia when he is capable.  Discussed the importance of continued conversation with family and the medical providers regarding overall plan of care and treatment options, ensuring decisions are within the context of the patient's values and GOCs.  Questions and concerns were addressed.   Hard Choices booklet left for review. The family was encouraged to call with questions or concerns.  PMT will continue to support holistically.  Conference with attending, bedside nursing staff, transition of care team related to patient condition, needs, goals of care, disposition.    HCPOA  NEXT OF KIN -daughter, Victor Tapia, is main contact.  Victor Tapia has [name] and son Victor Tapia also who are being updated by Victor Tapia and involved in discussions.  Victor Tapia second wife died approximately 6 weeks ago.  His first wife, the mother of his children, died 30 years ago.  I reassured her daughter that Victor Tapia siblings are not his decision makers, it is his children.    SUMMARY OF RECOMMENDATIONS   At this point continue to treat the treatable Full scope/full code Time for outcomes PMT to follow.   Code Status/Advance Care Planning: Full code - Victor Tapia shares that her father has never discussed end-of-life issues.  She shares that he is a "tough old coot" who thinks he will "live forever".  She shares that they have not discussed CODE STATUS.  We talked about the concept of "treat the treatable, but allow a natural passing".  Victor Tapia states that she will have these discussions with her siblings.  I share that I will have these discussions with Victor Tapia when he is capable  Symptom Management:  Per hospitalist, no additional needs at this time.  Palliative Prophylaxis:  Frequent Pain Assessment, Oral Care, and Palliative Wound Care  Additional Recommendations (Limitations, Scope, Preferences): Full Scope Treatment  Psycho-social/Spiritual:  Desire for further Chaplaincy support:no Additional Recommendations: Caregiving  Support/Resources and ICU Family Guide  Prognosis:  Unable to determine, based on outcomes.  Guarded at this point.  Discharge Planning: To Be Determined      Primary Diagnoses: Present on Admission:  Sepsis due to pneumonia (HCC)  Type 2 diabetes mellitus with stage 3  chronic kidney disease, without long-term current use of insulin (HCC)  Hyperlipemia  Essential hypertension, benign   I have reviewed the medical record, interviewed the patient and family, and examined the patient. The following aspects are pertinent.  Past Medical History:  Diagnosis Date   CAD (coronary artery disease)    Diabetes mellitus    Hyperlipidemia    Hypertension    OA (osteoarthritis)    MULTIPLE SITES    Prostate cancer (HCC) 1999   HORMONE TREATMENT    Social History   Socioeconomic History   Marital status: Married    Spouse name: Not on file   Number of children: 3   Years of education: 71  Highest education level: Bachelor's degree (e.g., BA, AB, BS)  Occupational History   Occupation: Psychologist, occupational    Comment: Retired   Occupation: Company secretary    Comment: Retired  Tobacco Use   Smoking status: Former    Current packs/day: 0.00    Average packs/day: 1 pack/day for 10.0 years (10.0 ttl pk-yrs)    Types: Cigarettes    Start date: 12/07/1952    Quit date: 12/08/1962    Years since quitting: 59.9   Smokeless tobacco: Never  Vaping Use   Vaping status: Never Used  Substance and Sexual Activity   Alcohol use: Yes    Alcohol/week: 3.0 standard drinks of alcohol    Types: 3 Cans of beer per week   Drug use: No   Sexual activity: Yes    Birth control/protection: None  Other Topics Concern   Not on file  Social History Narrative   Lives in 2 story home with handicap accessible bathroom with his wife who has Lewy Body Dementia   Social Determinants of Health   Financial Resource Strain: Low Risk  (02/21/2022)   Overall Financial Resource Strain (CARDIA)    Difficulty of Paying Living Expenses: Not hard at all  Food Insecurity: No Food Insecurity (02/21/2022)   Hunger Vital Sign    Worried About Running Out of Food in the Last Year: Never true    Ran Out of Food in the Last Year: Never true  Transportation Needs: No Transportation Needs (02/21/2022)    PRAPARE - Administrator, Civil Service (Medical): No    Lack of Transportation (Non-Medical): No  Physical Activity: Insufficiently Active (02/21/2022)   Exercise Vital Sign    Days of Exercise per Week: 3 days    Minutes of Exercise per Session: 30 min  Stress: No Stress Concern Present (02/21/2022)   [employer] of Occupational Health - Occupational Stress Questionnaire    Feeling of Stress : Not at all  Social Connections: Socially Integrated (02/21/2022)   Social Connection and Isolation Panel [NHANES]    Frequency of Communication with Friends and Family: More than three times a week    Frequency of Social Gatherings with Friends and Family: More than three times a week    Attends Religious Services: More than 4 times per year    Active Member of [employer] or Organizations: Yes    Attends Engineer, structural: More than 4 times per year    Marital Status: Married   Family History  Problem Relation Age of Onset   Cancer Sister        lymphoma   Cancer Brother        lunch   Scheduled Meds:  atorvastatin  20 mg Oral Daily   budesonide (PULMICORT) nebulizer solution  0.5 mg Nebulization BID   Chlorhexidine Gluconate Cloth  6 each Topical Daily   dextromethorphan-guaiFENesin  1 tablet Oral BID   heparin  5,000 Units Subcutaneous Q8H   insulin aspart  0-5 Units Subcutaneous QHS   insulin aspart  0-9 Units Subcutaneous TID WC   leptospermum manuka honey  1 Application Topical Daily   metoprolol tartrate  5 mg Intravenous Q8H   Continuous Infusions:  azithromycin Stopped (10/23/2022 2347)   cefTRIAXone (ROCEPHIN)  IV     lactated ringers 150 mL/hr (10/25/22 0419)   PRN Meds:.acetaminophen **OR** acetaminophen, hydrALAZINE, ondansetron **OR** ondansetron (ZOFRAN) IV, mouth rinse, oxyCODONE Medications Prior to Admission:  Prior to Admission medications   Medication Sig Start Date End  Date Taking? Authorizing Provider  amLODipine (NORVASC) 5 MG tablet Take  1 tablet (5 mg total) by mouth daily. 01/17/22  Yes Stacks, Broadus John, MD  atorvastatin (LIPITOR) 20 MG tablet TAKE 1 TABLET DAILY AT 6 P.M. 09/21/22  Yes Mechele Claude, MD  glimepiride (AMARYL) 4 MG tablet Take 1 tablet (4 mg total) by mouth daily with breakfast. 09/21/22  Yes Mechele Claude, MD  linagliptin (TRADJENTA) 5 MG TABS tablet TAKE 1 TABLET DAILY FOR DIABETES 09/21/22  Yes Mechele Claude, MD  metoprolol succinate (TOPROL-XL) 50 MG 24 hr tablet Take 1 tablet (50 mg total) by mouth daily. For high blood pressure 09/21/22  Yes Stacks, Broadus John, MD  aspirin 325 MG tablet Take 325 mg by mouth daily. Patient not taking: Reported on 10/26/2022    [provider]  ciprofloxacin (CIPRO) 500 MG tablet Take 1 tablet (500 mg total) by mouth 2 (two) times daily. Patient not taking: Reported on 11/06/2022 09/21/22   Mechele Claude, MD  ibuprofen (ADVIL) 800 MG tablet Take 800 mg by mouth every 8 (eight) hours as needed. Patient not taking: Reported on 11/02/2022 09/11/20   [provider]  Multiple Vitamins-Minerals (MENS 50+ MULTI VITAMIN/MIN PO) Take 1 tablet by mouth daily.  Patient not taking: Reported on 10/15/2022    [provider]  predniSONE (DELTASONE) 20 MG tablet 2 po at same time daily for 5 days Patient not taking: Reported on 10/14/2022 07/01/22   Dettinger, Elige Radon, MD  tamsulosin (FLOMAX) 0.4 MG CAPS capsule TAKE 2 CAPSULES AT BEDTIME FOR URINE FLOW AND PROSTATE Patient not taking: Reported on 10/11/2022 09/21/22   Mechele Claude, MD   No Known Allergies Review of Systems  Unable to perform ROS: Acuity of condition    Physical Exam Vitals and nursing note reviewed.     Vital Signs: BP (!) 168/60   Pulse (!) 113   Temp 97.9 F (36.6 C) (Axillary)   Resp (!) 25   Ht 5\' 9"  (1.753 m)   Wt 74.8 kg   SpO2 93%   BMI 24.35 kg/m  Pain Scale: CPOT   Pain Score: 5    SpO2: SpO2: 93 % O2 Device:SpO2: 93 % O2 Flow Rate: .   IO: Intake/output summary:   Intake/Output Summary (Last 24 hours) at 10/25/2022 1017 Last data filed at 10/25/2022 0419 Gross per 24 hour  Intake 989.17 ml  Output --  Net 989.17 ml    LBM:   Baseline Weight: Weight: 81.6 kg Most recent weight: Weight: 74.8 kg     Palliative Assessment/Data:     Time In: 0930  Time Out: 1045 Time Total: 75 minutes  Greater than 50%  of this time was spent counseling and coordinating care related to the above assessment and plan.  Signed by: Katheran Awe, NP   Please contact Palliative Medicine Team phone at [phone] for questions and concerns.  For individual provider: See Loretha Stapler

## 2022-10-25 NOTE — Assessment & Plan Note (Signed)
-   Hold Amaryl and Tradjenta - Sliding scale coverage - Hyperglycemic in the ED at 223 - Continue to monitor CBG

## 2022-10-25 NOTE — Progress Notes (Signed)
Patient seen and examined; admitted after midnight secondary to severe sepsis in the setting of pneumonia.  Found down at home after he was last seen for 3 days.  Acute kidney injury in the setting of rhabdomyolysis and dehydration.  Currently with altered mentation in the setting of metabolic encephalopathy making this unsafe to take by mouth.  Important medications that can be transferred to IV route has been substituted.  Will continue supportive care and assess swallowing safety when appropriate.  Palliative care will be contacted for goals of care discussion and advance care planning.  Please refer to H&P written by Dr. Carren Rang for further info/details on admission.  Plan: -continue IV antibiotics and supportive care -palliative care consultation for GOC and advance care planning. -follow renal function, electrolytes and CK level.  Vassie Loll MD [phone]

## 2022-10-25 NOTE — Consult Note (Addendum)
WOC Nurse Consult Note:patient found down at home on concrete  Reason for Consult: multiple injuries  Wound type: 1. Full thickness  2.  Deep Tissue Pressure Injury  Pressure Injury POA: Yes Measurement: 1.  Full thickness R elbow likely traumatic, 3 cm x 3 cm 100% brown yellow slough  2.  R hip full thickness 4 cm x 5 cm 100% pink moist  3.  R lateral knee abrasion full thickness 4 cm x 6 cm 50% pink moist 50% dry eschar  4.  R great toe full thickness  2 cm x 2 cm 90% pink moist 10% dry hemorrhagic tissue  5.  R lateral malleolus 2 cm x 2 cm 100% yellow slough  6.  L elbow full thickness 6 cm x 5 cm 50% yellow slough 50% pink moist  7.  L lateral heel Deep Tissue Pressure Injury 2 cm x 2 cm intact purple maroon discoloration  8.  Sacrum Deep Tissue Pressure Injury 12 cm x 7 cm intact purple maroon discoloration  9.  Bilateral feet/toes with multiple areas of full thickness, dry eschar L medial great toe 2 cm x 2 cm 100% dry eschar R medial foot 2 cm x 3 cm 100% dry eschar  Wound bed: Drainage (amount, consistency, odor) minimal tan exudate to R elbow, R lateral malleolus, L elbow, minimal serosanguinous R hip, all others dry  Periwound: patient with edema to arms and legs (R greater than L) and scattered ecchymosis  Dressing procedure/placement/frequency:  Clean R elbow, L elbow, R lateral malleolus wounds with NS, apply Medihoney to wound beds daily, cover with dry gauze and secure with silicone foam. May lift foam daily to replace Medihoney.  Change silicone foam q3 days and prn soiling.  Clean R hip, L heel, and sacral wounds  with NS, apply Xeroform gauze [place] [phone]) to wound beds daily.  Cover with silicone foam.   Paint R lateral knee abrasion and scattered dry wounds to bilateral toes and feet with Betadine 2 times a day.  May leave open to air. Place bilateral feet in Prevalon boots to offload pressure.    Patient should remain on a low air loss mattress when moved out of ICU for  pressure redistribution.    POC discussed with bedside nurse. WOC team will not follow at this time. Re-consult if further needs arise.   Thank you,    Priscella Mann MSN, RN-BC, [employer] [phone]

## 2022-10-25 NOTE — Progress Notes (Signed)
Lactic acid 3.6, MD aware.

## 2022-10-25 NOTE — Progress Notes (Signed)
Lactic 2.3, MD aware

## 2022-10-25 NOTE — Assessment & Plan Note (Addendum)
Continue statin. 

## 2022-10-26 DIAGNOSIS — S2249XA Multiple fractures of ribs, unspecified side, initial encounter for closed fracture: Secondary | ICD-10-CM

## 2022-10-26 DIAGNOSIS — N179 Acute kidney failure, unspecified: Secondary | ICD-10-CM | POA: Diagnosis not present

## 2022-10-26 DIAGNOSIS — E875 Hyperkalemia: Secondary | ICD-10-CM | POA: Diagnosis not present

## 2022-10-26 DIAGNOSIS — Z515 Encounter for palliative care: Secondary | ICD-10-CM | POA: Diagnosis not present

## 2022-10-26 DIAGNOSIS — E1122 Type 2 diabetes mellitus with diabetic chronic kidney disease: Secondary | ICD-10-CM | POA: Diagnosis not present

## 2022-10-26 DIAGNOSIS — E782 Mixed hyperlipidemia: Secondary | ICD-10-CM | POA: Diagnosis not present

## 2022-10-26 DIAGNOSIS — A419 Sepsis, unspecified organism: Secondary | ICD-10-CM | POA: Diagnosis not present

## 2022-10-26 DIAGNOSIS — T796XXA Traumatic ischemia of muscle, initial encounter: Secondary | ICD-10-CM | POA: Diagnosis not present

## 2022-10-26 DIAGNOSIS — I10 Essential (primary) hypertension: Secondary | ICD-10-CM | POA: Diagnosis not present

## 2022-10-26 DIAGNOSIS — J189 Pneumonia, unspecified organism: Secondary | ICD-10-CM | POA: Diagnosis not present

## 2022-10-26 DIAGNOSIS — L8990 Pressure ulcer of unspecified site, unspecified stage: Secondary | ICD-10-CM | POA: Diagnosis not present

## 2022-10-26 DIAGNOSIS — Z7189 Other specified counseling: Secondary | ICD-10-CM | POA: Diagnosis not present

## 2022-10-26 DIAGNOSIS — N1832 Chronic kidney disease, stage 3b: Secondary | ICD-10-CM | POA: Diagnosis not present

## 2022-10-26 LAB — GLUCOSE, CAPILLARY
Glucose-Capillary: 119 mg/dL — ABNORMAL HIGH (ref 70–99)
Glucose-Capillary: 120 mg/dL — ABNORMAL HIGH (ref 70–99)
Glucose-Capillary: 116 mg/dL — ABNORMAL HIGH (ref 70–99)
Glucose-Capillary: 114 mg/dL — ABNORMAL HIGH (ref 70–99)

## 2022-10-26 LAB — BASIC METABOLIC PANEL WITH GFR
Anion gap: 17 — ABNORMAL HIGH (ref 5–15)
BUN: 113 mg/dL — ABNORMAL HIGH (ref 8–23)
CO2: 20 mmol/L — ABNORMAL LOW (ref 22–32)
Calcium: 7.5 mg/dL — ABNORMAL LOW (ref 8.9–10.3)
Chloride: 106 mmol/L (ref 98–111)
Creatinine, Ser: 4.49 mg/dL — ABNORMAL HIGH (ref 0.61–1.24)
GFR, Estimated: 12 mL/min — ABNORMAL LOW (ref >60)
Glucose, Bld: 133 mg/dL — ABNORMAL HIGH (ref 70–99)
Potassium: 5.1 mmol/L (ref 3.5–5.1)
Sodium: 143 mmol/L (ref 135–145)

## 2022-10-26 MED ORDER — LACTATED RINGERS IV SOLN: INTRAVENOUS | Status: AC

## 2022-10-26 NOTE — Progress Notes (Signed)
Progress Note   Patient: Victor Tapia. ZOX:[id number] DOB: Mar 17, 1926 DOA: Nov 22, 2022     2 DOS: the patient was seen and examined on 10/26/2022   Brief hospital admission narrative: As per H&P written by Dr. Carren Rang On 10/25/2022 Demitrius Pedicini. is a 89 y.o. male with medical history significant of coronary artery disease, diabetes mellitus type 2, hyperlipidemia, hypertension, prostate cancer, presents the ED with a chief complaint of fall.  Sister and niece are at bedside.  Sister is attempting to be a historian, but is a poor historian.  She talks a lot about what she guesses happened, but does not actually know what happened.  She reports last time she talked to him was Thursday night.  She thinks his son talk to him sometime on Friday.  Patient was then found today, down on the cement floor in the garage.  He has pressure injuries from lying there and also friction injuries from rubbing his feet on the floor trying to get up.  Sister does report that when his son came in to visit him the whole family ended up with the flu.  He had a fever, headache, generalized weakness.  He was not coughing.  Those symptoms started about 2 weeks ago when he was getting better.  Patient lives alone because his wife died 6 weeks ago.  His daughter was staying with him for a while though and then his son stayed with him so he is actually been alone very little.  Patient is not able to give any history at this time.  Daughter in [place] is the POA and is discussing with other siblings to determine a CODE STATUS.  As far as anyone knows patient does not smoke or drink.   Assessment and Plan: *Severe sepsis due to pneumonia [facility]) - Patient may criteria for severe sepsis with hypothermia, elevated heart rate, increased respiratory rate and elevated lactic acid.  Source of infection pneumonia.  Organ dysfunction hypoxia, acute on chronic renal failure. -Chest x-ray shows nondisplaced left 4th through 8th rib  fractures and right upper lobe infectious/inflammatory changes. -Continue current antibiotic therapy -Continue the use of incentive spirometer/flutter valve -Wean off oxygen supplementation as tolerated -Maintain adequate hydration -Follow clinical results and cultures data. -After discussion with patient/family and goals of care meeting with palliative care decision has been made for patient to be DNR.  AKI on chronic kidney disease (acute kidney injury) (HCC) - Patient with chronic kidney disease stage IIIb -Presenting with acute kidney injury in the setting of rhabdomyolysis, sepsis physiology and dehydration. -Continue to follow renal function trend -Minimize nephrotoxic agent -Maintain adequate hydration.  Pressure injury of skin - Appreciate assistance and recommendation by wound care service -Continue constant repositioning and preventive measures.  Rhabdomyolysis -Found down for an unknown amount of time possibly 3 days - CPK was 2153 at time of admission -CK level down to thousand currently -Continue fluid resuscitation -Continue to follow clinical response  Essential hypertension, benign - Continue metoprolol (currently given IV secondary to concerns of aspiration) -As needed hydralazine also ordered.  Hyperlipemia - Continue statin -When able to take by mouth.  Type 2 diabetes mellitus with stage 3 chronic kidney disease, without long-term current use of insulin (HCC) - Stage IIIb chronic kidney disease -Continue holding hold Amaryl and Tradjenta - Continue sliding scale insulin and follow CBGs fluctuation. -Maintain adequate hydration.    Hyperkalemia/metabolic acidosis -In the setting of acute kidney injury most likely -Potassium trending down -Continue to follow trend -Continue  to maintain adequate hydration and follow renal function response. -Patient's bicarb up to 20 now.  Subjective:  Awake, oriented x 2 and following commands appropriately.  (The  only thing patient missed was who was the president and has some confusion about a year).  Following commands appropriately and complaining of left costal chest discomfort.  Physical Exam: Vitals:   10/26/22 0900 10/26/22 1000 10/26/22 1100 10/26/22 1135  BP: (!) 154/42 (!) 137/41 (!) 137/46   Pulse: 72 69 96   Resp: 15 15 20    Temp:    98.1 F (36.7 C)  TempSrc:    Axillary  SpO2: 96% 93% 91%   Weight:      Height:       General exam: Alert, awake, oriented x 2; following commands appropriately; afebrile and in no major distress. Respiratory system: Positive rhonchi, decreased breath sounds at the bases; no using accessory muscles. Cardiovascular system: Rate controlled, no rubs, no gallops, no JVD. Gastrointestinal system: Abdomen is nondistended, soft and nontender. No organomegaly or masses felt. Normal bowel sounds heard. Central nervous system: Moving 4 limbs spontaneously.  No focal neurological deficits. Extremities: No cyanosis or clubbing. Skin: No petechiae.  Multiple abrasions and skin breakdown; there was also deep tissue pressure injury without open drainage in his sacrum. Psychiatry: Mood & affect appropriate.   Data Reviewed: CK level: 1105 Basic metabolic panel: Sodium 143, potassium 5.1, chloride 106, bicarb 20, BUN 113, creatinine 4.49 GFR 12  Family Communication: Brother and sister at bedside.  Disposition: Status is: Inpatient Remains inpatient appropriate because: Continue IV antibiotics.   Planned Discharge Destination: Skilled nursing facility for short-term rehabilitation most likely.  Time spent: 55 minutes  Author: Vassie Loll, MD 10/26/2022 11:39 AM  For on call review [email or web address].

## 2022-10-26 NOTE — Evaluation (Signed)
Clinical/Bedside Swallow Evaluation Patient Details  Name: Victor Tapia. MRN: [id number] Date of Birth: 15-Mar-1926  Today's Date: 10/26/2022 Time: SLP Start Time (ACUTE ONLY): 1435 SLP Stop Time (ACUTE ONLY): 1513 SLP Time Calculation (min) (ACUTE ONLY): 38 min  Past Medical History:  Past Medical History:  Diagnosis Date   CAD (coronary artery disease)    Diabetes mellitus    Hyperlipidemia    Hypertension    OA (osteoarthritis)    MULTIPLE SITES    Prostate cancer (HCC) 1999   HORMONE TREATMENT    Past Surgical History:  Past Surgical History:  Procedure Laterality Date   CATARACT  SURGERY RIGHT EYE  4/10   CATARACT EXTRACTION W/PHACO Left 12/09/2016   Procedure: CATARACT EXTRACTION PHACO AND INTRAOCULAR LENS PLACEMENT LEFT EYE CDE=12.57;  Surgeon: Victor Pierce, MD;  Location: [facility];  Service: Ophthalmology;  Laterality: Left;  left   KNEE SURGERY Right    1965   TRIPLE BY PASS  1999   HPI:  Victor Tapia. is a 89 y.o. male with medical history significant of coronary artery disease, diabetes mellitus type 2, hyperlipidemia, hypertension, prostate cancer, presents the ED with a chief complaint of fall.  Sister and niece are at bedside.  Sister is attempting to be a historian, but is a poor historian.  She talks a lot about what she guesses happened, but does not actually know what happened.  She reports last time she talked to him was Thursday night.  She thinks his son talk to him sometime on Friday.  Patient was then found today, down on the cement floor in the garage.  He has pressure injuries from lying there and also friction injuries from rubbing his feet on the floor trying to get up.  Sister does report that when his son came in to visit him the whole family ended up with the flu.  He had a fever, headache, generalized weakness.  He was not coughing.  Those symptoms started about 2 weeks ago when he was getting better.  Patient lives alone because his wife died 6 weeks  ago.  His daughter was staying with him for a while though and then his son stayed with him so he is actually been alone very little.  Patient is not able to give any history at this time.  Daughter in [place] is the POA and is discussing with other siblings to determine a CODE STATUS.  As far as anyone knows patient does not smoke or drink. BSE requested    Assessment / Plan / Recommendation  Clinical Impression  Clinical swallowing evaluation completed while Pt was sitting upright in bed; Pt was alert and responsive and "thirsty". Note xerostomia; SLP provided oral care and moistened oral cavity with toothettes prior to PO trials. Pt accepted ice chips without incident. Note wet vocal quality, inconsistent immediate and delayed coughing with thin liquids. No wet vocal quality or coughing noted with NTL. With regular textures note prolonged mastication and decreased awareness of bolus with min to mod oral residue after the swallow. Pt consumed puree textures without incident. Recommend initiate D2/fine chop diet and NECTAR thick liquids. Recommend meds to be adminitered crushed with puree. ST will continue to follow for possible need for instrumental assessment or subjective upgrade. Above reviewed with family at bedside and RN. SLP Visit Diagnosis: Dysphagia, oropharyngeal phase (R13.12)    Aspiration Risk  Mild aspiration risk;Moderate aspiration risk    Diet Recommendation Dysphagia 2 (Fine chop);Nectar-thick liquid  Liquid Administration via: Cup Medication Administration: Crushed with puree Supervision: Full supervision/cueing for compensatory strategies;Staff to assist with self feeding Compensations: Slow rate;Small sips/bites Postural Changes: Seated upright at 90 degrees    Other  Recommendations Oral Care Recommendations: Oral care QID Caregiver Recommendations: Avoid jello, ice cream, thin soups, popsicles;Remove water pitcher    Recommendations for follow up therapy are one  component of a multi-disciplinary discharge planning process, led by the attending physician.  Recommendations may be updated based on patient status, additional functional criteria and insurance authorization.  Follow up Recommendations Skilled nursing-short term rehab (<3 hours/day)      Assistance Recommended at Discharge    Functional Status Assessment Patient has had a recent decline in their functional status and demonstrates the ability to make significant improvements in function in a reasonable and predictable amount of time.  Frequency and Duration min 2x/week  1 week       Prognosis Prognosis for improved oropharyngeal function: Fair      Swallow Study   General Date of Onset: 10/12/2022 HPI: Victor Tapia. is a 89 y.o. male with medical history significant of coronary artery disease, diabetes mellitus type 2, hyperlipidemia, hypertension, prostate cancer, presents the ED with a chief complaint of fall.  Sister and niece are at bedside.  Sister is attempting to be a historian, but is a poor historian.  She talks a lot about what she guesses happened, but does not actually know what happened.  She reports last time she talked to him was Thursday night.  She thinks his son talk to him sometime on Friday.  Patient was then found today, down on the cement floor in the garage.  He has pressure injuries from lying there and also friction injuries from rubbing his feet on the floor trying to get up.  Sister does report that when his son came in to visit him the whole family ended up with the flu.  He had a fever, headache, generalized weakness.  He was not coughing.  Those symptoms started about 2 weeks ago when he was getting better.  Patient lives alone because his wife died 6 weeks ago.  His daughter was staying with him for a while though and then his son stayed with him so he is actually been alone very little.  Patient is not able to give any history at this time.  Daughter in [place]  is the POA and is discussing with other siblings to determine a CODE STATUS.  As far as anyone knows patient does not smoke or drink. BSE requested Type of Study: Bedside Swallow Evaluation Previous Swallow Assessment: none in chart Diet Prior to this Study: NPO Temperature Spikes Noted: No Respiratory Status: Nasal cannula History of Recent Intubation: No Behavior/Cognition: Alert;Cooperative;Pleasant mood Oral Cavity Assessment: Within Functional Limits Oral Care Completed by SLP: Yes Oral Cavity - Dentition: Missing dentition (lower partial) Vision: Functional for self-feeding Self-Feeding Abilities: Able to feed self Patient Positioning: Upright in bed Baseline Vocal Quality: Normal Volitional Cough: Weak Volitional Swallow: Able to elicit    Oral/Motor/Sensory Function Overall Oral Motor/Sensory Function: Generalized oral weakness   Ice Chips Ice chips: Within functional limits   Thin Liquid Thin Liquid: Impaired Presentation: Straw;Cup;Spoon Oral Phase Impairments: Reduced labial seal;Reduced lingual movement/coordination;Impaired mastication;Poor awareness of bolus Pharyngeal  Phase Impairments: Multiple swallows;Wet Vocal Quality;Throat Clearing - Delayed;Throat Clearing - Immediate;Cough - Immediate;Cough - Delayed    Nectar Thick Nectar Thick Liquid: Within functional limits Presentation: Cup   Honey Thick Honey  Thick Liquid: Not tested   Puree Puree: Within functional limits   Solid     Solid: Impaired Presentation: Spoon Oral Phase Impairments: Reduced labial seal;Reduced lingual movement/coordination;Poor awareness of bolus;Impaired mastication Oral Phase Functional Implications: Impaired mastication;Oral residue;Oral holding Pharyngeal Phase Impairments: Multiple swallows;Wet Vocal Quality     Zechariah Bissonnette H. Romie Levee, CCC-SLP Speech Language Pathologist  Georgetta Haber 10/26/2022,4:13 PM

## 2022-10-26 NOTE — Plan of Care (Signed)
Problem: Fluid Volume: Goal: Hemodynamic stability will improve Outcome: Progressing   Problem: Clinical Measurements: Goal: Diagnostic test results will improve Outcome: Progressing   Problem: Clinical Measurements: Goal: Signs and symptoms of infection will decrease Outcome: Progressing   Problem: Respiratory: Goal: Ability to maintain adequate ventilation will improve Outcome: Progressing

## 2022-10-26 NOTE — Progress Notes (Signed)
Palliative: [name] is lying quietly in bed.  He appears acutely/chronically ill and somewhat frail.  He does seem improved from yesterday.  He will make an somewhat keep eye contact.  He is alert and oriented, I believe able to make his basic needs known.  His Sister Victor Tapia and his niece are present at bedside along with attending.  His brother Victor Tapia, who is caring for his dog "Victor Tapia", is leaving.  Attending finishes rounding and also departs.  [name] and I discussed his goals.  We talked about his improvement in mental state, improvement in creatinine.  We talked about speech therapy consult.   We talk about his improvements in mental state and labs.   We talked about CODE STATUS.  At this point [name] would want to "treat the treatable, but allowing natural passing".  DNR verified and orders adjusted.  Family at bedside in agreement.  We talked about the likely need for short-term rehab.  I share that almost 3 weeks are covered at no cost.  I share that our transition of care team will assist with finding a rehab.  Sister Victor Tapia states that they would prefer either the [place] [facility] in [place] or [facility].  I shared the transition of care team will continue to work with patient on needs and rehab if qualified.  I share that I will reach out to daughter, Victor Tapia, to update to which [name] agrees.  Call to daughter, Victor Tapia. We talk about Mr. Yust improvements overnight. We reviewed labs and the treatment plan.  We talk about ST consult, and PT consult in the future.   Conference with attending, bedside nursing staff, transition of care team related to patient condition, needs, goals of care, disposition.  Plan: At this point continue to treat the treatable but no CPR or intubation.  Time for outcomes.  Anticipate need for short-term rehab.  50 minutes Victor Carmel, NP Palliative medicine team Team phone [phone] Greater than 50% of this time was spent counseling and  coordinating care related to the above assessment and plan.

## 2022-10-27 ENCOUNTER — Inpatient Hospital Stay (HOSPITAL_COMMUNITY): Payer: Medicare Other

## 2022-10-27 ENCOUNTER — Telehealth: Payer: Self-pay | Admitting: Family Medicine

## 2022-10-27 DIAGNOSIS — E782 Mixed hyperlipidemia: Secondary | ICD-10-CM | POA: Diagnosis not present

## 2022-10-27 DIAGNOSIS — R29818 Other symptoms and signs involving the nervous system: Secondary | ICD-10-CM | POA: Diagnosis not present

## 2022-10-27 DIAGNOSIS — Z7189 Other specified counseling: Secondary | ICD-10-CM | POA: Diagnosis not present

## 2022-10-27 DIAGNOSIS — J189 Pneumonia, unspecified organism: Secondary | ICD-10-CM | POA: Diagnosis not present

## 2022-10-27 DIAGNOSIS — S2249XA Multiple fractures of ribs, unspecified side, initial encounter for closed fracture: Secondary | ICD-10-CM | POA: Diagnosis not present

## 2022-10-27 DIAGNOSIS — T796XXA Traumatic ischemia of muscle, initial encounter: Secondary | ICD-10-CM | POA: Diagnosis not present

## 2022-10-27 DIAGNOSIS — A419 Sepsis, unspecified organism: Secondary | ICD-10-CM | POA: Diagnosis not present

## 2022-10-27 DIAGNOSIS — I6522 Occlusion and stenosis of left carotid artery: Secondary | ICD-10-CM | POA: Diagnosis not present

## 2022-10-27 DIAGNOSIS — Z515 Encounter for palliative care: Secondary | ICD-10-CM | POA: Diagnosis not present

## 2022-10-27 DIAGNOSIS — I6782 Cerebral ischemia: Secondary | ICD-10-CM | POA: Diagnosis not present

## 2022-10-27 DIAGNOSIS — G238 Other specified degenerative diseases of basal ganglia: Secondary | ICD-10-CM | POA: Diagnosis not present

## 2022-10-27 LAB — LEGIONELLA PNEUMOPHILA SEROGP 1 UR AG: L. pneumophila Serogp 1 Ur Ag: NEGATIVE

## 2022-10-27 LAB — BASIC METABOLIC PANEL
Anion gap: 15 (ref 5–15)
BUN: 100 mg/dL — ABNORMAL HIGH (ref 8–23)
CO2: 19 mmol/L — ABNORMAL LOW (ref 22–32)
Calcium: 7.2 mg/dL — ABNORMAL LOW (ref 8.9–10.3)
Chloride: 108 mmol/L (ref 98–111)
Creatinine, Ser: 3.94 mg/dL — ABNORMAL HIGH (ref 0.61–1.24)
GFR, Estimated: 13 mL/min — ABNORMAL LOW (ref >60)
Glucose, Bld: 195 mg/dL — ABNORMAL HIGH (ref 70–99)
Potassium: 4.6 mmol/L (ref 3.5–5.1)
Sodium: 142 mmol/L (ref 135–145)

## 2022-10-27 LAB — GLUCOSE, CAPILLARY
Glucose-Capillary: 130 mg/dL — ABNORMAL HIGH (ref 70–99)
Glucose-Capillary: 201 mg/dL — ABNORMAL HIGH (ref 70–99)
Glucose-Capillary: 181 mg/dL — ABNORMAL HIGH (ref 70–99)
Glucose-Capillary: 207 mg/dL — ABNORMAL HIGH (ref 70–99)

## 2022-10-27 LAB — VITAMIN B12: Vitamin B-12: 821 pg/mL (ref 180–914)

## 2022-10-27 MED ORDER — ALUM & MAG HYDROXIDE-SIMETH 200-200-20 MG/5ML PO SUSP
30.0000 mL | ORAL | Status: DC | PRN
  Administered 2022-10-27: 30 mL via ORAL
  Filled 2022-10-27: qty 30

## 2022-10-27 MED ORDER — ALBUMIN HUMAN 25 % IV SOLN
50.0000 g | Freq: Four times a day (QID) | INTRAVENOUS | Status: AC
  Administered 2022-10-27 (×2): 50 g via INTRAVENOUS
  Filled 2022-10-27 (×2): qty 200

## 2022-10-27 MED ORDER — LACTATED RINGERS IV SOLN: INTRAVENOUS | Status: AC

## 2022-10-27 MED ORDER — OXYCODONE HCL 5 MG PO TABS: 5.0000 mg | ORAL_TABLET | Freq: Three times a day (TID) | ORAL | Status: DC | PRN

## 2022-10-27 NOTE — Progress Notes (Addendum)
0928-Dr Madera aware of swelling in right arm from fingers to upper arm. Per speech in room, pt was moving this arm yesterday. Pt only able to wiggle fingers a little today. Can not pick the arm up or move it. Pt moving all other extremities and no other neuro deficits noted. No warmth noted. Pt able to feel touch. Bp cuff moved to left side. Arm elevated on pillow. Radial pulse strong. Also aware to call Harrold Donath, daughter who called and wanted an update from the physician

## 2022-10-27 NOTE — TOC Initial Note (Signed)
Transition of Care (TOC) - Initial/Assessment Note    Patient Details  Name: Victor Tapia. MRN: [id number] Date of Birth: 1926-07-16  Transition of Care [facility]) CM/SW Contact:    Elliot Gault, LCSW Phone Number: 10/27/2022, 3:14 PM  Clinical Narrative:                  Pt admitted from home. He has a high readmission risk score. PT recommending SNF.   Spoke with pt/family and they are agreeable to SNF rehab. Pt lives alone and had been previously independent in ADLs at home. Reviewed CMS provider options and will refer as requested.  Will follow up in AM.  Expected Discharge Plan: Skilled Nursing Facility Barriers to Discharge: Continued Medical Work up   Patient Goals and CMS Choice Patient states their goals for this hospitalization and ongoing recovery are:: rehab CMS Medicare.gov Compare Post Acute Care list provided to:: Patient Represenative (must comment) Choice offered to / list presented to : Sibling [place] ownership interest in [facility].provided to:: Sibling    Expected Discharge Plan and Services In-house Referral: Clinical Social Work   Post Acute Care Choice: Skilled Nursing Facility Living arrangements for the past 2 months: Single Family Home                                      Prior Living Arrangements/Services Living arrangements for the past 2 months: Single Family Home Lives with:: Self Patient language and need for interpreter reviewed:: Yes Do you feel safe going back to the place where you live?: Yes      Need for Family Participation in Patient Care: No (Comment)     Criminal Activity/Legal Involvement Pertinent to Current Situation/Hospitalization: No - Comment as needed  Activities of Daily Living      Permission Sought/Granted Permission sought to share information with : Oceanographer granted to share information with : Yes, Verbal Permission Granted     Permission granted to  share info w AGENCY: [facility]        Emotional Assessment Appearance:: Appears stated age     Orientation: : Oriented to Self, Oriented to Place, Oriented to  Time, Oriented to Situation Alcohol / Substance Use: Not Applicable Psych Involvement: No (comment)  Admission diagnosis:  AKI (acute kidney injury) (HCC) [N17.9] Closed fracture of multiple ribs, unspecified laterality, initial encounter [S22.49XA] Traumatic rhabdomyolysis, initial encounter (HCC) [T79.6XXA] Sepsis due to pneumonia (HCC) [J18.9, A41.9] Aspiration pneumonia, unspecified aspiration pneumonia type, unspecified laterality, unspecified part of lung (HCC) [J69.0] Patient Active Problem List   Diagnosis Date Noted   Rhabdomyolysis 10/25/2022   Pressure injury of skin 10/25/2022   AKI (acute kidney injury) (HCC) 10/25/2022   Hyperkalemia 10/25/2022   Sepsis due to pneumonia (HCC) 11-16-22   Prostate CA (HCC) 12/08/2015   Type 2 diabetes mellitus with stage 3 chronic kidney disease, without long-term current use of insulin (HCC) 10/28/2014   Hyperlipemia 10/28/2014   Essential hypertension, benign 10/28/2014   Heel spur 10/28/2014   PCP:  Mechele Claude, MD Pharmacy:   [facility] - [address], [place] - [address] [place] [address] Phone: [phone] Fax: [phone]  [facility] - [place], [place] - [address] [place] [address] Phone: [phone] Fax: [phone]  [facility] [phone] - [place], [place] -  [address] [facility] [place] [place] [address] Phone: [phone] Fax: [phone]     Social Determinants of Health (SDOH) Social History: SDOH Screenings   Food Insecurity: No Food Insecurity (02/21/2022)  Housing: Low Risk  (02/21/2022)  Transportation Needs: No Transportation Needs (02/21/2022)  Utilities: Not At Risk (02/21/2022)  Alcohol Screen: Low Risk  (02/21/2022)   Depression (PHQ2-9): Low Risk  (09/21/2022)  Financial Resource Strain: Low Risk  (02/21/2022)  Physical Activity: Insufficiently Active (02/21/2022)  Social Connections: Socially Integrated (02/21/2022)  Stress: No Stress Concern Present (02/21/2022)  Tobacco Use: Medium Risk (Oct 25, 2022)   SDOH Interventions:     Readmission Risk Interventions    10/25/2022    2:27 PM  Readmission Risk Prevention Plan  Transportation Screening Complete  Home Care Screening Complete  Medication Review (RN CM) Complete

## 2022-10-27 NOTE — Evaluation (Signed)
Physical Therapy Evaluation Patient Details Name: Victor Tapia. MRN: [id number] DOB: 23-Dec-1926 Today's Date: 10/27/2022  History of Present Illness  per MD,Victor Tapia. is a 89 y.o. male with medical history significant of coronary artery disease, diabetes mellitus type 2, hyperlipidemia, hypertension, prostate cancer, presents the ED with a chief complaint of fall.  Sister and niece are at bedside.  Sister is attempting to be a historian, but is a poor historian.  She talks a lot about what she guesses happened, but does not actually know what happened.  She reports last time she talked to him was Thursday night.  She thinks his son talk to him sometime on Friday.  Patient was then found today, down on the cement floor in the garage.  He has pressure injuries from lying there and also friction injuries from rubbing his feet on the floor trying to get up.  Sister does report that when his son came in to visit him the whole family ended up with the flu.  He had a fever, headache, generalized weakness.  He was not coughing.  Those symptoms started about 2 weeks ago when he was getting better.  Patient lives alone because his wife died 6 weeks ago.  His daughter was staying with him for a while though and then his son stayed with him so he is actually been alone very little.  Patient is not able to give any history at this time.  Daughter in [place] is the POA and is discussing with other siblings to determine a CODE STATUS.  As far as anyone knows patient does not smoke or drink.  Review of Systems: unable to review all systems due to the inability of the patient to answer questions.  Clinical Impression  PT agreeable to therapy.  Pt assist as much as possible with Lt side, RT side is flaccid at this time.  Max assist for supine to sit to supine.  PT will benefit from skilled PT to improve bed mobility and transfers.         If plan is discharge home, recommend the following: Two people to help with  walking and/or transfers;Two people to help with bathing/dressing/bathroom;Assistance with cooking/housework;Assist for transportation   Can travel by private vehicle   No    Equipment Recommendations None recommended by PT  Recommendations for Other Services  OT consult    Functional Status Assessment Patient has had a recent decline in their functional status and demonstrates the ability to make significant improvements in function in a reasonable and predictable amount of time.     Precautions / Restrictions Precautions Precautions: Fall Restrictions Weight Bearing Restrictions: No      Mobility  Bed Mobility Overal bed mobility: Needs Assistance Bed Mobility: Rolling, Sidelying to Sit, Sit to Supine Rolling: Max assist Sidelying to sit: Max assist   Sit to supine: Max assist                      Balance  Sitting balance poor                                            Pertinent Vitals/Pain Pain Assessment Pain Assessment: 0-10 Pain Score: 3  Pain Location: back when going from supine to sit. Pain Descriptors / Indicators: Aching Pain Intervention(s): Limited activity within patient's tolerance    Home Living Family/patient  expects to be discharged to:: Skilled nursing facility                        Prior Function Prior Level of Function : Independent/Modified Independent             Mobility Comments: Pt did not use any AD       Extremity/Trunk Assessment   Upper Extremity Assessment Upper Extremity Assessment:  (Rt upperand Lower extremities are flaccid.)    Lower Extremity Assessment Lower Extremity Assessment: RLE deficits/detail;LLE deficits/detail RLE Deficits / Details: flaccid LLE Deficits / Details: generally 2/5       Communication   Communication Communication: No apparent difficulties Cueing Techniques: Verbal cues  Cognition Arousal: Alert Behavior During Therapy: WFL for tasks  assessed/performed Overall Cognitive Status: Within Functional Limits for tasks assessed                                             Exercises General Exercises - Lower Extremity Ankle Circles/Pumps: PROM, AROM, 10 reps Quad Sets: 10 reps, Left Short Arc Quad: Left, 10 reps Long Arc Quad: Left, 10 reps Heel Slides: 10 reps, Left, AAROM (PROM for Rt) Hip ABduction/ADduction: AAROM, Left, 10 reps (PROM for Rt)   Assessment/Plan    PT Assessment Patient needs continued PT services  PT Problem List Decreased strength;Decreased activity tolerance;Decreased mobility;Impaired tone;Decreased balance       PT Treatment Interventions Functional mobility training;Therapeutic exercise;Therapeutic activities    PT Goals (Current goals can be found in the Care Plan section)  Acute Rehab PT Goals Patient Stated Goal: to improve mobility PT Goal Formulation: With patient/family    Frequency Min 3X/week        AM-PAC PT "6 Clicks" Mobility  Outcome Measure Help needed turning from your back to your side while in a flat bed without using bedrails?: Total Help needed moving from lying on your back to sitting on the side of a flat bed without using bedrails?: Total Help needed moving to and from a bed to a chair (including a wheelchair)?: Total Help needed standing up from a chair using your arms (e.g., wheelchair or bedside chair)?: Total Help needed to walk in hospital room?: Total Help needed climbing 3-5 steps with a railing? : Total 6 Click Score: 6    End of Session   Activity Tolerance: Patient tolerated treatment well Patient left: in bed;with bed alarm set;with call bell/phone within reach;with family/visitor present   PT Visit Diagnosis: Other abnormalities of gait and mobility (R26.89);Muscle weakness (generalized) (M62.81);History of falling (Z91.81);Difficulty in walking, not elsewhere classified (R26.2);Hemiplegia and hemiparesis Hemiplegia - Right/Left:  Right Hemiplegia - dominant/non-dominant: Dominant    Time: 6962-9528 PT Time Calculation (min) (ACUTE ONLY): 38 min   Charges:   PT Evaluation $PT Eval Low Complexity: 1 Low PT Treatments $Therapeutic Exercise: 8-22 mins PT General Charges $$ ACUTE PT VISIT: 1 Visit         Virgina Organ, PT CLT [phone]  10/27/2022, 1:51 PM

## 2022-10-27 NOTE — Plan of Care (Signed)
  Problem: Acute Rehab PT Goals(only PT should resolve) Goal: Pt will Roll Supine to Side Flowsheets (Taken 10/27/2022 1350) Pt will Roll Supine to Side: with mod assist Goal: Pt Will Go Supine/Side To Sit Flowsheets (Taken 10/27/2022 1350) Pt will go Supine/Side to Sit: with minimal assist Goal: Pt Will Go Sit To Supine/Side Flowsheets (Taken 10/27/2022 1350) Pt will go Sit to Supine/Side: with moderate assist Goal: Patient Will Perform Sitting Balance Flowsheets (Taken 10/27/2022 1350) Patient will perform sitting balance: 3- 5 min Goal: Pt Will Transfer Bed To Chair/Chair To Bed Flowsheets (Taken 10/27/2022 1350) Pt will Transfer Bed to Chair/Chair to Bed: with mod assist

## 2022-10-27 NOTE — [place] FL2 (Signed)
[place] MEDICAID FL2 LEVEL OF CARE FORM     IDENTIFICATION  Patient Name: Victor Tapia. Birthdate: [birth year] Sex: male Admission Date (Current Location): Nov 14, 2022  [facility] [address] [facility] and [place] Number:  [employer] and Address:  [facility],  [address], [place] [address]      Provider Number: [id number]  Attending Physician Name and Address:  Vassie Loll, MD  Relative Name and Phone Number:       Current Level of Care: Hospital Recommended Level of Care: Skilled Nursing Facility Prior Approval Number:    Date Approved/Denied:   PASRR Number: [id number]  Discharge Plan: SNF    Current Diagnoses: Patient Active Problem List   Diagnosis Date Noted   Rhabdomyolysis 10/25/2022   Pressure injury of skin 10/25/2022   AKI (acute kidney injury) (HCC) 10/25/2022   Hyperkalemia 10/25/2022   Sepsis due to pneumonia (HCC) 11-14-2022   Prostate CA (HCC) 12/08/2015   Type 2 diabetes mellitus with stage 3 chronic kidney disease, without long-term current use of insulin (HCC) 10/28/2014   Hyperlipemia 10/28/2014   Essential hypertension, benign 10/28/2014   Heel spur 10/28/2014    Orientation RESPIRATION BLADDER Height & Weight     Self, Time, Situation, Place  O2 (see dc summary) Incontinent Weight: 164 lb 14.5 oz (74.8 kg) Height:  5\' 9"  (175.3 cm)  BEHAVIORAL SYMPTOMS/MOOD NEUROLOGICAL BOWEL NUTRITION STATUS      Continent Diet (see dc summary)  AMBULATORY STATUS COMMUNICATION OF NEEDS Skin   Extensive Assist Verbally Skin abrasions, PU Stage and Appropriate Care   PU Stage 2 Dressing: Daily                   Personal Care Assistance Level of Assistance  Bathing, Feeding, Dressing Bathing Assistance: Maximum assistance Feeding assistance: Independent Dressing Assistance: Maximum assistance     Functional Limitations Info  Sight, Hearing, Speech Sight Info: Adequate Hearing Info: Adequate Speech Info: Impaired    SPECIAL CARE  FACTORS FREQUENCY  PT (By licensed PT), OT (By licensed OT)     PT Frequency: 5x week OT Frequency: 5x week            Contractures Contractures Info: Not present    Additional Factors Info  Code Status, Allergies Code Status Info: DNR Allergies Info: NKA           Current Medications (10/27/2022):  This is the current hospital active medication list Current Facility-Administered Medications  Medication Dose Route Frequency Provider Last Rate Last Admin   acetaminophen (TYLENOL) tablet 650 mg  650 mg Oral Q6H PRN Vassie Loll, MD       Or   acetaminophen (TYLENOL) suppository 650 mg  650 mg Rectal Q6H PRN Vassie Loll, MD       albumin human 25 % solution 50 g  50 g Intravenous Q6H Vassie Loll, MD       atorvastatin (LIPITOR) tablet 20 mg  20 mg Oral Daily Vassie Loll, MD   20 mg at 10/27/22 1027   azithromycin (ZITHROMAX) 500 mg in sodium chloride 0.9 % 250 mL IVPB  500 mg Intravenous Q24H Vassie Loll, MD 250 mL/hr at 10/26/22 2106 500 mg at 10/26/22 2106   budesonide (PULMICORT) nebulizer solution 0.5 mg  0.5 mg Nebulization BID Vassie Loll, MD   0.5 mg at 10/27/22 0853   cefTRIAXone (ROCEPHIN) 1 g in sodium chloride 0.9 % 100 mL IVPB  1 g Intravenous Q24H Vassie Loll, MD 200 mL/hr at 10/26/22 2210 1  g at 10/26/22 2210   Chlorhexidine Gluconate Cloth 2 % PADS 6 each  6 each Topical Daily Vassie Loll, MD   6 each at 10/27/22 1000   dextromethorphan-guaiFENesin (MUCINEX DM) 30-600 MG per 12 hr tablet 1 tablet  1 tablet Oral BID Vassie Loll, MD   1 tablet at 10/27/22 1027   heparin injection 5,000 Units  5,000 Units Subcutaneous Q8H Vassie Loll, MD   5,000 Units at 10/27/22 0539   hydrALAZINE (APRESOLINE) injection 10 mg  10 mg Intravenous Q8H PRN Vassie Loll, MD       insulin aspart (novoLOG) injection 0-5 Units  0-5 Units Subcutaneous QHS Vassie Loll, MD       insulin aspart (novoLOG) injection 0-9 Units  0-9 Units Subcutaneous TID WC Vassie Loll, MD   3 Units at 10/27/22 1227   lactated ringers infusion   Intravenous Continuous Vassie Loll, MD       leptospermum manuka honey (MEDIHONEY) paste 1 Application  1 Application Topical Daily Vassie Loll, MD   1 Application at 10/26/22 0910   metoprolol tartrate (LOPRESSOR) injection 5 mg  5 mg Intravenous Myriam Jacobson, MD   5 mg at 10/27/22 0540   ondansetron (ZOFRAN) tablet 4 mg  4 mg Oral Q6H PRN Vassie Loll, MD       Or   ondansetron [facility]) injection 4 mg  4 mg Intravenous Q6H PRN Vassie Loll, MD       Oral care mouth rinse  15 mL Mouth Rinse PRN Vassie Loll, MD       oxyCODONE (Oxy IR/ROXICODONE) immediate release tablet 5 mg  5 mg Oral Q8H PRN Vassie Loll, MD         Discharge Medications: Please see discharge summary for a list of discharge medications.  Relevant Imaging Results:  Relevant Lab Results:   Additional Information SSN: [id number], LCSW

## 2022-10-27 NOTE — Progress Notes (Signed)
Palliative:   Victor Tapia is resting quietly in bed.  He has had changes in his functional status.  He will go for head CT.  Discussion with sister Victor Tapia at bedside.  Face to face with bedside nursing staff and attending.   Call to [name].  We talk about his speech therapy consult and recommendations.  We talked about declines this morning and CT head showing no acute process.  We talked about time for outcomes.  We talked about meaningful improvements.  Victor Tapia shares her concerns about her father's physical declines.  We review selected labs also.  Conference with attending, bedside nursing staff, transition of care team related to patient condition, needs, goals of care, disposition.  Return later in the day to Victor Tapia room.  Bedside nursing staff is present attending to needs.  Victor Tapia agrees for his daughter, Victor Tapia to have access to his MyChart records.  Notice sent.  Plan: At this point continue to treat the treatable but no CPR or intubation.  Time for outcomes.  Anticipate need for short-term rehab.  50 minutes  Lillia Carmel, NP Palliative Medicine Team  Team phone [phone] Greater than 50% of this time was spent counseling and coordinating care related to the above assessment and plan.

## 2022-10-27 NOTE — Telephone Encounter (Signed)
Tell her thanks, and that I saw th report from the doctor there.

## 2022-10-27 NOTE — Progress Notes (Addendum)
1100, Dr Gwenlyn Perking at bedside and verbalized will test for possible stroke. Pt in ct at 1118  back in room at 1129. Pt alert/oriented, speech weak. Sister marie at bedside and has been updated.  NIH charted, pt could not feel touch on right foot/right leg but could on right arm and right face.  When returned from ct pt was able to feel touch again on right leg/foot.

## 2022-10-27 NOTE — Progress Notes (Addendum)
Progress Note   Patient: Victor Tapia. ZOX:[id number] DOB: 12-04-1926 DOA: 11/07/2022     3 DOS: the patient was seen and examined on 10/27/2022   Brief hospital admission narrative: As per H&P written by Dr. Carren Rang On 10/25/2022 Victor Tapia. is a 89 y.o. male with medical history significant of coronary artery disease, diabetes mellitus type 2, hyperlipidemia, hypertension, prostate cancer, presents the ED with a chief complaint of fall.  Sister and niece are at bedside.  Sister is attempting to be a historian, but is a poor historian.  She talks a lot about what she guesses happened, but does not actually know what happened.  She reports last time she talked to him was Thursday night.  She thinks his son talk to him sometime on Friday.  Patient was then found today, down on the cement floor in the garage.  He has pressure injuries from lying there and also friction injuries from rubbing his feet on the floor trying to get up.  Sister does report that when his son came in to visit him the whole family ended up with the flu.  He had a fever, headache, generalized weakness.  He was not coughing.  Those symptoms started about 2 weeks ago when he was getting better.  Patient lives alone because his wife died 6 weeks ago.  His daughter was staying with him for a while though and then his son stayed with him so he is actually been alone very little.  Patient is not able to give any history at this time.  Daughter in [place] is the POA and is discussing with other siblings to determine a CODE STATUS.  As far as anyone knows patient does not smoke or drink.   Assessment and Plan: *Severe sepsis due to pneumonia [facility]) - Patient may criteria for severe sepsis with hypothermia, elevated heart rate, increased respiratory rate and elevated lactic acid.  Source of infection pneumonia.  Organ dysfunction hypoxia, acute on chronic renal failure. -Chest x-ray shows nondisplaced left 4th through 8th rib  fractures and right upper lobe infectious/inflammatory changes. -Continue current antibiotic therapy -Continue the use of incentive spirometer/flutter valve -Wean off oxygen supplementation as tolerated -Maintain adequate hydration -Follow clinical results and cultures data. -After discussion with patient/family and goals of care meeting with palliative care decision has been made for patient to be DNR.  AKI on chronic kidney disease (acute kidney injury) (HCC) - Patient with chronic kidney disease stage IIIb -Presenting with acute kidney injury in the setting of rhabdomyolysis, sepsis physiology and dehydration. -Continue to follow renal function trend -Minimize nephrotoxic agent -Maintain adequate hydration while closely following patient volume status. -Creatinine trending down adequately..  Pressure injury of skin - Appreciate assistance and recommendation by wound care service -Continue constant repositioning and preventive measures.  Rhabdomyolysis -Found down for an unknown amount of time possibly 3 days - CPK was 2153 at time of admission -CK level down to thousand currently -Continue fluid resuscitation -Continue to follow clinical response -last CK 1,105  Essential hypertension, benign -Continue metoprolol (currently given IV secondary to concerns of aspiration) -As needed hydralazine also ordered.  Hyperlipemia - Continue statin -When able to take by mouth.  Type 2 diabetes mellitus with stage 3 chronic kidney disease, without long-term current use of insulin (HCC) - Stage IIIb chronic kidney disease -Continue holding hold Amaryl and Tradjenta - Continue sliding scale insulin and follow CBGs fluctuation. -Maintain adequate hydration.    Hyperkalemia/metabolic acidosis -In the setting of acute  kidney injury most likely -Potassium within normal limits currently. -Continue to follow trend -Continue to maintain adequate hydration and follow renal function  response. -Patient's bicarb up to 19-20 now.  Right-sided weakness -Patient with right-sided weakness on today's examination -New for patient since yesterday (10/26/2022) -Acute CT head without contrast demonstrating no acute intracranial normalities and no hemorrhagic changes. -MRI brain will be assessed and if positive pursuit stroke work up.  Subjective:  Alert, following commands and expressing no pain.  Right-sided weakness and per family slightly slurred speech reported.  Physical Exam: Vitals:   10/27/22 1131 10/27/22 1150 10/27/22 1219 10/27/22 1300  BP: (!) 161/35  92/80 (!) 122/37  Pulse: 78  76 70  Resp: 17  20 17   Temp:  98.7 F (37.1 C)    TempSrc:  Axillary    SpO2: 94%  95% 91%  Weight:      Height:       General exam: Alert, awake, oriented x 3; no overnight events reported. Respiratory system: Clear to auscultation. Respiratory effort normal.  4 L nasal cannula supplementation in place. Cardiovascular system:RRR. No rubs or gallops; no JVD. Gastrointestinal system: Abdomen is nondistended, soft and nontender. No organomegaly or masses felt. Normal bowel sounds heard. Central nervous system: Right-sided weakness (upper extremity more than lower extremity); per family slight slower speech also noticed. Extremities: No cyanosis or clubbing.  Trace edema appreciated bilaterally. Skin: No petechiae.  Multiple abrasions and skin breakdown along with deep tissue pressure injury without open drainage in his sacrum.  Please refer to pressure injury/wound care report by wound care service notes. Psychiatry: Judgement and insight appear normal. Mood & affect appropriate.   Data Reviewed: B12: 821 Basic metabolic panel:Sodium 142, potassium 4.6, chloride 108, carbon 19, BUN 100, creatinine 3.94, GFR 13  Family Communication: Brother and sister at bedside.  Disposition: Status is: Inpatient Remains inpatient appropriate because: Continue IV antibiotics.   Planned  Discharge Destination: Skilled nursing facility for short-term rehabilitation most likely.  Time spent: 55 minutes  Author: Vassie Loll, MD 10/27/2022 2:26 PM  For on call review [email or web address].

## 2022-10-27 NOTE — Progress Notes (Signed)
Speech Language Pathology Treatment: Dysphagia  Patient Details Name: Victor Tapia. MRN: [id number] DOB: 1926-03-17 Today's Date: 10/27/2022 Time: 6295-2841 SLP Time Calculation (min) (ACUTE ONLY): 21 min  Assessment / Plan / Recommendation Clinical Impression  Ongoing diagnostic dysphagia therapy provided today. Pt was alert and responsive however, note swelling in Right upper extremity and Pt's inability to move arm; reported to nursing who was aware and contacting MD. Nursing reports O2 went down last night while Pt was eating dinner. Pt consumed breakfast this morning (D2/NTL) without incident. SLP provided trials of thin liquid and note fewer s/sx of airway compromise this morning with only occasional wet vocal quality and one immediate coughing episode noted with consecutive cup sips of thin liquids. Recommend continue with D2/fine chop diet and Nectar thick liquids; SLP will continue to follow acutely and will consider instrumental assessment tomorrow if indicated. Plan of care reviewed with nursing, family and patient.    HPI HPI: Taiveon Luetkemeyer. is a 89 y.o. male with medical history significant of coronary artery disease, diabetes mellitus type 2, hyperlipidemia, hypertension, prostate cancer, presents the ED with a chief complaint of fall.  Sister and niece are at bedside.  Sister is attempting to be a historian, but is a poor historian.  She talks a lot about what she guesses happened, but does not actually know what happened.  She reports last time she talked to him was Thursday night.  She thinks his son talk to him sometime on Friday.  Patient was then found today, down on the cement floor in the garage.  He has pressure injuries from lying there and also friction injuries from rubbing his feet on the floor trying to get up.  Sister does report that when his son came in to visit him the whole family ended up with the flu.  He had a fever, headache, generalized weakness.  He was not  coughing.  Those symptoms started about 2 weeks ago when he was getting better.  Patient lives alone because his wife died 6 weeks ago.  His daughter was staying with him for a while though and then his son stayed with him so he is actually been alone very little.  Patient is not able to give any history at this time.  Daughter in [place] is the POA and is discussing with other siblings to determine a CODE STATUS.  As far as anyone knows patient does not smoke or drink. BSE requested      SLP Plan  Continue with current plan of care      Recommendations for follow up therapy are one component of a multi-disciplinary discharge planning process, led by the attending physician.  Recommendations may be updated based on patient status, additional functional criteria and insurance authorization.    Recommendations  Diet recommendations: Dysphagia 2 (fine chop);Nectar-thick liquid Liquids provided via: Cup Medication Administration: Crushed with puree Supervision: Full supervision/cueing for compensatory strategies Compensations: Slow rate;Small sips/bites Postural Changes and/or Swallow Maneuvers: Seated upright 90 degrees                  Oral care QID     Dysphagia, oropharyngeal phase (R13.12)     Continue with current plan of care   Elize Pinon H. Romie Levee, CCC-SLP Speech Language Pathologist   Georgetta Haber  10/27/2022, 11:45 AM

## 2022-10-27 NOTE — Progress Notes (Signed)
Pt transported to ICU with RN. Report given to [facility].

## 2022-10-27 NOTE — Progress Notes (Signed)
Patient's yellow necklace given to his sister, Hilda Lias.

## 2022-10-27 NOTE — Progress Notes (Signed)
Patient's O2 sat is staying around 88-92% on 6 lpm. Placed patient on HFNC at 8 lpm.

## 2022-10-27 NOTE — Telephone Encounter (Signed)
SISTER AWARE.

## 2022-10-28 ENCOUNTER — Inpatient Hospital Stay (HOSPITAL_COMMUNITY): Payer: Medicare Other

## 2022-10-28 DIAGNOSIS — I639 Cerebral infarction, unspecified: Secondary | ICD-10-CM

## 2022-10-28 DIAGNOSIS — I6389 Other cerebral infarction: Secondary | ICD-10-CM

## 2022-10-28 DIAGNOSIS — A419 Sepsis, unspecified organism: Secondary | ICD-10-CM | POA: Diagnosis not present

## 2022-10-28 DIAGNOSIS — J189 Pneumonia, unspecified organism: Secondary | ICD-10-CM | POA: Diagnosis not present

## 2022-10-28 LAB — GLUCOSE, CAPILLARY
Glucose-Capillary: 164 mg/dL — ABNORMAL HIGH (ref 70–99)
Glucose-Capillary: 160 mg/dL — ABNORMAL HIGH (ref 70–99)
Glucose-Capillary: 89 mg/dL (ref 70–99)
Glucose-Capillary: 95 mg/dL (ref 70–99)

## 2022-10-28 LAB — ECHOCARDIOGRAM COMPLETE
AR max vel: 1.62 cm2
AV Area VTI: 1.52 cm2
AV Area mean vel: 1.6 cm2
AV Mean grad: 15 mmHg
AV Peak grad: 27.2 mmHg
Ao pk vel: 2.61 m/s
Area-P 1/2: 5.02 cm2
Height: 69 in
MV M vel: 2.2 m/s
MV Peak grad: 19.4 mmHg
S' Lateral: 2.3 cm
Weight: 2638.47 oz

## 2022-10-28 MED ORDER — GLYCOPYRROLATE 0.2 MG/ML IJ SOLN: 0.2000 mg | INTRAMUSCULAR | Status: DC | PRN

## 2022-10-28 MED ORDER — MORPHINE SULFATE (PF) 2 MG/ML IV SOLN
2.0000 mg | INTRAVENOUS | Status: DC | PRN
  Administered 2022-10-28 – 2022-10-31 (×2): 2 mg via INTRAVENOUS
  Filled 2022-10-28: qty 1

## 2022-10-28 MED ORDER — CLOPIDOGREL BISULFATE 75 MG PO TABS
75.0000 mg | ORAL_TABLET | Freq: Every day | ORAL | Status: DC
  Administered 2022-10-29 – 2022-10-30 (×2): 75 mg via ORAL
  Filled 2022-10-28 (×2): qty 1

## 2022-10-28 MED ORDER — ASPIRIN 81 MG PO TBEC: 81.0000 mg | DELAYED_RELEASE_TABLET | Freq: Every day | ORAL | Status: DC

## 2022-10-28 MED ORDER — ASPIRIN 300 MG RE SUPP
300.0000 mg | Freq: Every day | RECTAL | Status: DC
  Administered 2022-10-28: 300 mg via RECTAL
  Filled 2022-10-28: qty 1

## 2022-10-28 MED ORDER — MORPHINE SULFATE (PF) 2 MG/ML IV SOLN
INTRAVENOUS | Status: AC
  Filled 2022-10-28: qty 1

## 2022-10-28 MED ORDER — ASPIRIN 325 MG PO TABS: 325.0000 mg | ORAL_TABLET | Freq: Once | ORAL | Status: DC

## 2022-10-28 MED ORDER — GLYCOPYRROLATE 0.2 MG/ML IJ SOLN
INTRAMUSCULAR | Status: AC
  Administered 2022-10-28: 0.2 mg
  Filled 2022-10-28: qty 1

## 2022-10-28 MED ORDER — ASPIRIN 81 MG PO CHEW
81.0000 mg | CHEWABLE_TABLET | Freq: Every day | ORAL | Status: DC
  Administered 2022-10-29 – 2022-10-30 (×2): 81 mg via ORAL
  Filled 2022-10-28 (×2): qty 1

## 2022-10-28 NOTE — Consult Note (Addendum)
I connected with  Victor Tapia. on 10/28/22 by a video enabled telemedicine application and verified that I am speaking with the correct person using two identifiers.   I discussed the limitations of evaluation and management by telemedicine. The patient 's siblings expressed understanding and agreed to proceed.   Location of patient: [facility] Patient position: [facility]  Neurology Consultation Reason for Consult: Stroke Referring Physician: Dr. [name]  CC: Right-sided weakness  History is obtained from: Chart review and family at bedside  HPI: Victor Tapia. is a 89 y.o. male with past medical history of hypertension, hyperlipidemia, diabetes, coronary artery disease, osteoarthritis who was brought in on 2022/10/26 after he was found down in the garage after a fall.  He was diagnosed with sepsis due to pneumonia and is currently on antibiotics.  On 89/10/2022, hospitalist noticed that he was weak on his right side.  Therefore MRI brain was done which showed left MCA stroke.  Therefore neurology was consulted for further recommendations.  Last known normal: Sometime on 10/26/2022 Event happened at the hospital No tPA as outside window No thrombectomy as outside window and no large vessel occlusion mRS 0  ROS: Unable to review due to aphasia  Past Medical History:  Diagnosis Date   CAD (coronary artery disease)    Diabetes mellitus    Hyperlipidemia    Hypertension    OA (osteoarthritis)    MULTIPLE SITES    Prostate cancer (HCC) 1999   HORMONE TREATMENT     Family History  Problem Relation Age of Onset   Cancer Sister        lymphoma   Cancer Brother        lunch     Social History:  reports that he quit smoking about 59 years ago. His smoking use included cigarettes. He started smoking about 69 years ago. He has a 10 pack-year smoking history. He has never used smokeless tobacco. He reports current alcohol use of about 3.0 standard drinks  of alcohol per week. He reports that he does not use drugs.   Medications Prior to Admission  Medication Sig Dispense Refill Last Dose   amLODipine (NORVASC) 5 MG tablet Take 1 tablet (5 mg total) by mouth daily. 90 tablet 3 Past Week   atorvastatin (LIPITOR) 20 MG tablet TAKE 1 TABLET DAILY AT 6 P.M. 90 tablet 3 Past Week   glimepiride (AMARYL) 4 MG tablet Take 1 tablet (4 mg total) by mouth daily with breakfast. 90 tablet 3 Past Week   linagliptin (TRADJENTA) 5 MG TABS tablet TAKE 1 TABLET DAILY FOR DIABETES 90 tablet 3 Past Week   metoprolol succinate (TOPROL-XL) 50 MG 24 hr tablet Take 1 tablet (50 mg total) by mouth daily. For high blood pressure 90 tablet 3 Past Week   aspirin 325 MG tablet Take 325 mg by mouth daily. (Patient not taking: Reported on 2022/10/26)   Not Taking   ciprofloxacin (CIPRO) 500 MG tablet Take 1 tablet (500 mg total) by mouth 2 (two) times daily. (Patient not taking: Reported on 2022-10-26) 14 tablet 0 Not Taking   ibuprofen (ADVIL) 800 MG tablet Take 800 mg by mouth every 8 (eight) hours as needed. (Patient not taking: Reported on 26-Oct-2022)   Not Taking   Multiple Vitamins-Minerals (MENS 50+ MULTI VITAMIN/MIN PO) Take 1 tablet by mouth daily.  (Patient not taking: Reported on 10-26-22)   Not Taking   predniSONE (DELTASONE) 20 MG tablet 2 po at same time  daily for 5 days (Patient not taking: Reported on 2022-11-09) 10 tablet 0 Not Taking   tamsulosin (FLOMAX) 0.4 MG CAPS capsule TAKE 2 CAPSULES AT BEDTIME FOR URINE FLOW AND PROSTATE (Patient not taking: Reported on 11-09-22) 180 capsule 3 Not Taking      Exam: Current vital signs: BP 135/64 (BP Location: Right Arm)   Pulse (!) 128   Temp 98.8 F (37.1 C) (Axillary)   Resp (!) 23   Ht 5\' 9"  (1.753 m)   Wt 74.8 kg   SpO2 92%   BMI 24.35 kg/m  Vital signs in last 24 hours: Temp:  [97.6 F (36.4 C)-98.8 F (37.1 C)] 98.8 F (37.1 C) (09/20 1201) Pulse Rate:  [83-152] 128 (09/20 1249) Resp:  [18-34] 23  (09/20 1249) BP: (106-193)/(28-67) 135/64 (09/20 1201) SpO2:  [86 %-97 %] 92 % (09/20 1249) FiO2 (%):  [80 %] 80 % (09/20 1120)   Physical Exam  Constitutional: Appears well-developed and well-nourished.  Neuro: Awake, alert, PERRLA, looks around the room and tracks examiner and family, unable to answer any orientation questions, does not follow commands, no apparent facial asymmetry, 3/5 in left upper extremity, no movement in right upper extremity, withdraws to noxious stimuli in bilateral lower extremities, unable to participate in finger-nose testing, withdraws to noxious stimuli in all extremities  NIHSS 21   INPUTS: 1A: Level of consciousness --> 0 = Alert; keenly responsive 1B: Ask month and age --> 2 = Aphasic 1C: 'Blink eyes' & 'squeeze hands' --> 2 = Performs 0 tasks 2: Horizontal extraocular movements --> 0 = Normal 3: Visual fields --> 0 = No visual loss 4: Facial palsy --> 0 = Normal symmetry 5A: Left arm motor drift --> 2 = Some effort against gravity 5B: Right arm motor drift --> 4 = No movement 6A: Left leg motor drift --> 3 = No effort against gravity 6B: Right leg motor drift --> 3 = No effort against gravity 7: Limb Ataxia --> 0 = Does not understand 8: Sensation --> 0 = Normal; no sensory loss 9: Language/aphasia --> 3 = Mute/global aphasia: no usable speech/auditory comprehension 10: Dysarthria --> 2 = Mute/anarthric 11: Extinction/inattention --> 0 = No abnormality   I have reviewed labs in epic and the results pertinent to this consultation are: CBC:  Recent Labs  Lab 11-09-2022 1746 10/25/22 0139  WBC 35.6* 30.7*  NEUTROABS 32.8* 28.9*  HGB 14.4 11.9*  HCT 44.8 37.4*  MCV 96.3 95.9  PLT 220 202    Basic Metabolic Panel:  Lab Results  Component Value Date   NA 142 10/27/2022   K 4.6 10/27/2022   CO2 19 (L) 10/27/2022   GLUCOSE 195 (H) 10/27/2022   BUN 100 (H) 10/27/2022   CREATININE 3.94 (H) 10/27/2022   CALCIUM 7.2 (L) 10/27/2022   GFRNONAA  13 (L) 10/27/2022   GFRAA 57 (L) 03/26/2020   Lipid Panel:  Lab Results  Component Value Date   LDLCALC 55 09/21/2022   HgbA1c:  Lab Results  Component Value Date   HGBA1C 5.9 (H) 10/25/2022   Urine Drug Screen: No results found for: "LABOPIA", "COCAINSCRNUR", "LABBENZ", "AMPHETMU", "THCU", "LABBARB"  Alcohol Level No results found for: "ETH"   I have reviewed the images obtained:  CT Head without contrast 10/27/2022:No hemorrhage or CT evidence of an acute cortical infarct.    MRI Brain and MRA head wo contrast 10/27/2022: Patchy acute infarcts in the left MCA and MCA/PCA watershed distributions without hemorrhage or mass effect.  Occluded  left ICA, incompletely imaged, with reconstitution of flow in the M1 segment via anterior and posterior communicating arteries. There is overall attenuated flow in the left MCA branches relative to the right.. Otherwise patent intracranial vasculature with multifocal atherosclerotic irregularity but no other proximal high-grade stenosis or occlusion.  TTE 10/28/2022:  Left ventricular ejection fraction, by estimation, is 60 to 65%. The left ventricle has no regional wall motion abnormalities. Cannot exclude a small PFO.        ASSESSMENT/PLAN: 89 year old male with multiple medical comorbidities as noted above, noted to have right-sided weakness yesterday.  Acute ischemic stroke -Etiology: Likely large vessel atherosclerosis and artery to artery emboli  Recommendations: -Will order carotid ultrasound to look for symptomatic carotid stenosis -Recommend aspirin 81 mg daily and Plavix and 5 mg daily for secondary stroke prevention -LDL pending.  If more than 65, recommend high intensity atorvastatin -Discussed with daughter Lawson Fiscal who is the POA on phone.  Patient would not want any aggressive measures including evaluation for carotid artery stenting or endarterectomy -PT/OT, speech therapy -Goal blood pressure: Systolic 120-1 40 as patient has  significant intracranial stenosis.  Avoid hypotension -If symptoms recur, please make sure patient is not hypotensive, lay flat and repeat CT head -Of note, patient's family has been talking to palliative care and it appears that goal is to minimize any aggressive/heroic interventions.  If patient improves then he can be transferred to a nursing facility.  However patient deteriorates, family will likely consider comfort care -Discussed plan with siblings at bedside, Dr. Lawson Fiscal on phone and Dr. Mariea Clonts via secure chat -If patient is discharged, recommend follow-up with neurology in 2 months   Thank you for allowing [place] to participate in the care of this patient. If you have any further questions, please contact  me or neurohospitalist.   Lindie Spruce Epile[facility] neurohospitalist

## 2022-10-28 NOTE — Progress Notes (Signed)
RT removed BIPAP mask and placed pt on 12L salter HFNC at this time. Pt is tolerating settings well. BIPAP is on standby at bedside if needed.

## 2022-10-28 NOTE — Progress Notes (Signed)
Speech Language Pathology Treatment: Dysphagia  Patient Details Name: Victor Tapia. MRN: [id number] DOB: April 26, 1926 Today's Date: 10/28/2022 Time: 3086-5784 SLP Time Calculation (min) (ACUTE ONLY): 40 min  Assessment / Plan / Recommendation Clinical Impression  Ongoing diagnostic dysphagia treatment provided this date with brother, Victor Tapia at bedside. Note Pt is more lethargic and weak this morning. Pt did accept bites of puree and NTL. Note increased shortness of breath and congested sounds with breathing and Pt now requiring 8L/mn HiFlo. Communicated with daughter via phone with MD at bedside and she requests comfort and dignity but treating the treatable. Recommend NPO pending comfort measures initiated or medical improvement allowing him to participate in MBSS. ST will continue to follow,   HPI HPI: Victor Tapia. is a 89 y.o. male with medical history significant of coronary artery disease, diabetes mellitus type 2, hyperlipidemia, hypertension, prostate cancer, presents the ED with a chief complaint of fall.  Sister and niece are at bedside.  Sister is attempting to be a historian, but is a poor historian.  She talks a lot about what she guesses happened, but does not actually know what happened.  She reports last time she talked to him was Thursday night.  She thinks his son talk to him sometime on Friday.  Patient was then found today, down on the cement floor in the garage.  He has pressure injuries from lying there and also friction injuries from rubbing his feet on the floor trying to get up.  Sister does report that when his son came in to visit him the whole family ended up with the flu.  He had a fever, headache, generalized weakness.  He was not coughing.  Those symptoms started about 2 weeks ago when he was getting better.  Patient lives alone because his wife died 6 weeks ago.  His daughter was staying with him for a while though and then his son stayed with him so he is actually been  alone very little.  Patient is not able to give any history at this time.  Daughter in [place] is the POA and is discussing with other siblings to determine a CODE STATUS.  As far as anyone knows patient does not smoke or drink. BSE requested      SLP Plan  Continue with current plan of care      Recommendations for follow up therapy are one component of a multi-disciplinary discharge planning process, led by the attending physician.  Recommendations may be updated based on patient status, additional functional criteria and insurance authorization.    Recommendations  Diet recommendations: NPO Liquids provided via: Cup Medication Administration: Crushed with puree Supervision: Full supervision/cueing for compensatory strategies Compensations: Slow rate;Small sips/bites Postural Changes and/or Swallow Maneuvers: Seated upright 90 degrees                  Oral care QID     Dysphagia, oropharyngeal phase (R13.12)     Continue with current plan of care    Victor Tapia, CCC-SLP Speech Language Pathologist  Victor Tapia  10/28/2022, 10:41 AM

## 2022-10-28 NOTE — Progress Notes (Signed)
Progress Note   Patient: Victor Tapia. GNF:[id number] DOB: 1926/12/25 DOA: Nov 09, 2022     4 DOS: the patient was seen and examined on 10/28/2022   Brief hospital admission narrative: As per H&P written by Dr. Carren Rang On 10/25/2022 Victor Tapia. is a 89 y.o. male with medical history significant of coronary artery disease, diabetes mellitus type 2, hyperlipidemia, hypertension, prostate cancer, presents the ED with a chief complaint of fall.  Sister and niece are at bedside.  Sister is attempting to be a historian, but is a poor historian.  She talks a lot about what she guesses happened, but does not actually know what happened.  She reports last time she talked to him was Thursday night.  She thinks his son talk to him sometime on Friday.  Patient was then found today, down on the cement floor in the garage.  He has pressure injuries from lying there and also friction injuries from rubbing his feet on the floor trying to get up.  Sister does report that when his son came in to visit him the whole family ended up with the flu.  He had a fever, headache, generalized weakness.  He was not coughing.  Those symptoms started about 2 weeks ago when he was getting better.  Patient lives alone because his wife died 6 weeks ago.  His daughter was staying with him for a while though and then his son stayed with him so he is actually been alone very little.  Patient is not able to give any history at this time.  Daughter in [place] is the POA and is discussing with other siblings to determine a CODE STATUS.  As far as anyone knows patient does not smoke or drink.   Assessment and Plan: 1)Severe sepsis due to pneumonia (HCC)--POA - Patient met criteria for severe sepsis with hypothermia, elevated heart rate, increased respiratory rate and elevated lactic acid.  Source of infection pneumonia.  Organ dysfunction hypoxia, acute on chronic renal failure. -Chest x-ray shows nondisplaced left 4th through 8th rib  fractures and right upper lobe infectious/inflammatory changes. - 10/28/22 -Worsening hypoxia and tachypnea requiring high flow nasal cannula and BiPAP -After discussion with patient/family and goals of care meeting with palliative care decision has been made for patient to be DNR. =-Treat the treatable, BiPAP okay otherwise no heroic measures  2)Acute left MCA/PCA strokes with right-sided weakness --MRI brain with acute strokes -Neurology consult appreciated-atorvastatin, aspirin and Plavix recommended -Carotid artery Dopplers and echo requested -Lipid profile requested  AKI on chronic kidney disease (acute kidney injury) (HCC) - Patient with chronic kidney disease stage IIIb -Presenting with acute kidney injury in the setting of rhabdomyolysis, sepsis physiology and dehydration. -Continue to follow renal function trend -Minimize nephrotoxic agent -Maintain adequate hydration while closely following patient volume status. -Creatinine trending down adequately..  Pressure injury of skin - Appreciate assistance and recommendation by wound care service -Continue constant repositioning and preventive measures.  Rhabdomyolysis -Found down for an unknown amount of time possibly 3 days - CPK was 2153 at time of admission -CK level down to thousand currently -Continue fluid resuscitation -Continue to follow clinical response -last CK 1,105  Essential hypertension, benign -Continue metoprolol (currently given IV secondary to concerns of aspiration) -As needed hydralazine also ordered.  Type 2 diabetes mellitus with stage 3 chronic kidney disease, without long-term current use of insulin (HCC) - Stage IIIb chronic kidney disease -Continue holding hold Amaryl and Tradjenta - Continue sliding scale insulin and follow CBGs  fluctuation. -Maintain adequate hydration.    Hyperkalemia/metabolic acidosis -In the setting of acute kidney injury most likely -Potassium within normal limits  currently. -Continue to follow trend -Continue to maintain adequate hydration and follow renal function response. -Patient's bicarb up to 19-20 now.   Subjective:  -Worsening hypoxia and tachypnea, patient's brothers Dorene Sorrow and Chase Picket at bedside, patient's daughter Lawson Fiscal is on the phone on speaker -Respiratory distress noted requiring   BiPAP temporarily and suctioning from respiratory therapist  Physical Exam: Vitals:   10/28/22 1500 10/28/22 1600 10/28/22 1621 10/28/22 1700  BP: (!) 134/50 (!) 126/40  (!) 121/54  Pulse: (!) 112 (!) 103  92  Resp: 15 15  15   Temp:   98.8 F (37.1 C)   TempSrc:   Axillary   SpO2: 96% 96%  95%  Weight:      Height:        Physical Exam  Gen:-Resp distress and tachypnea , easily arousable HEENT:- [place].AT, No sclera icterus Nose- HFNC 15 L/min/alternating with BiPAP Neck-Supple Neck,No JVD,.  Lungs-  -breath sounds with scattered rhonchi CV- S1, S2 normal, RRR Abd-  +ve B.Sounds, Abd Soft, No tenderness,    Extremity/Skin:- No  edema,   good pedal pulses  Psych-affect is appropriate, oriented x3 Neuro-Rt-sided weakness, , no tremors   Family Communication: Brothers  and sister at bedside.  Disposition: SNF Status is: Inpatient    Author: [name], MD 10/28/2022 7:28 PM  For on call review [email or web address].

## 2022-10-28 NOTE — Plan of Care (Signed)
progressing

## 2022-10-28 NOTE — Care Management Important Message (Signed)
Important Message  Patient Details  Name: Victor Tapia. MRN: [id number] Date of Birth: [birth year]   Medicare Important Message Given:  Yes     Corey Harold 10/28/2022, 11:58 AM

## 2022-10-28 NOTE — Progress Notes (Signed)
RT called to assess pt due to increased WOB and SPO2 low 80s. HFNC was increased to 15L from 8L when RT entered room. RT nasotracheal suctioned pt x3 with moderate secretions obtained. RT placed patient on BIPAP per MD order with noticeable improvement to WOB. Pt is toelrating BIPAP well at this time. RT will continue to monitor.  10/28/22 1120  BiPAP/CPAP/SIPAP  $ Non-Invasive Ventilator  Non-Invasive Vent Set Up;Non-Invasive Vent Initial  $ Face Mask Medium Yes  BiPAP/CPAP/SIPAP Pt Type Adult  BiPAP/CPAP/SIPAP SERVO (air)  Mask Type Full face mask  Mask Size Medium  Set Rate 10 breaths/min  Respiratory Rate 25 breaths/min  IPAP 16 cmH20  EPAP 8 cmH2O  Pressure Support 8 cmH20  PEEP 8 cmH20  FiO2 (%) 80 %  Minute Ventilation 17.4  Leak 24  Peak Inspiratory Pressure (PIP) 16  Tidal Volume (Vt) 636  Patient Home Equipment No  Auto Titrate No  Press High Alarm 25 cmH2O  Press Low Alarm 5 cmH2O  Nasal massage performed No (comment)  BiPAP/CPAP /SiPAP Vitals  Pulse Rate (!) 151  Resp (!) 25  BP 139/67  SpO2 97 %  Bilateral Breath Sounds Rhonchi

## 2022-10-28 NOTE — TOC Progression Note (Signed)
Transition of Care (TOC) - Progression Note    Patient Details  Name: Victor Tapia. MRN: [id number] Date of Birth: Jun 08, 1926  Transition of Care [facility] CM/SW Contact  Elliot Gault, LCSW Phone Number: 10/28/2022, 11:35 AM  Clinical Narrative:     TOC following. Discussed pt's status with MD who states pt requiring HF and Bipap and has new confirmed CVA from yesterday.  TOC will follow up Monday and review SNF bed offers with family if appropriate at that time.  Expected Discharge Plan: Skilled Nursing Facility Barriers to Discharge: Continued Medical Work up  Expected Discharge Plan and Services In-house Referral: Clinical Social Work   Post Acute Care Choice: Skilled Nursing Facility Living arrangements for the past 2 months: Single Family Home                                       Social Determinants of Health (SDOH) Interventions SDOH Screenings   Food Insecurity: No Food Insecurity (02/21/2022)  Housing: Low Risk  (02/21/2022)  Transportation Needs: No Transportation Needs (02/21/2022)  Utilities: Not At Risk (02/21/2022)  Alcohol Screen: Low Risk  (02/21/2022)  Depression (PHQ2-9): Low Risk  (09/21/2022)  Financial Resource Strain: Low Risk  (02/21/2022)  Physical Activity: Insufficiently Active (02/21/2022)  Social Connections: Socially Integrated (02/21/2022)  Stress: No Stress Concern Present (02/21/2022)  Tobacco Use: Medium Risk (10/25/2022)    Readmission Risk Interventions    10/25/2022    2:27 PM  Readmission Risk Prevention Plan  Transportation Screening Complete  Home Care Screening Complete  Medication Review (RN CM) Complete

## 2022-10-28 NOTE — Progress Notes (Signed)
Echocardiogram 2D Echocardiogram has been performed.  Victor Tapia 10/28/2022, 10:31 AM

## 2022-10-28 NOTE — Plan of Care (Signed)

## 2022-10-29 ENCOUNTER — Inpatient Hospital Stay (HOSPITAL_COMMUNITY): Payer: Medicare Other

## 2022-10-29 DIAGNOSIS — J189 Pneumonia, unspecified organism: Secondary | ICD-10-CM | POA: Diagnosis not present

## 2022-10-29 DIAGNOSIS — A419 Sepsis, unspecified organism: Secondary | ICD-10-CM | POA: Diagnosis not present

## 2022-10-29 LAB — LIPID PANEL
Cholesterol: 60 mg/dL (ref 0–200)
HDL: 24 mg/dL — ABNORMAL LOW (ref >40)
LDL Cholesterol: 24 mg/dL (ref 0–99)
Total CHOL/HDL Ratio: 2.5 RATIO
Triglycerides: 59 mg/dL (ref <150)
VLDL: 12 mg/dL (ref 0–40)

## 2022-10-29 LAB — COMPREHENSIVE METABOLIC PANEL
ALT: 45 U/L — ABNORMAL HIGH (ref 0–44)
AST: 40 U/L (ref 15–41)
Albumin: 2.4 g/dL — ABNORMAL LOW (ref 3.5–5.0)
Alkaline Phosphatase: 106 U/L (ref 38–126)
Anion gap: 14 (ref 5–15)
BUN: 110 mg/dL — ABNORMAL HIGH (ref 8–23)
CO2: 23 mmol/L (ref 22–32)
Calcium: 8.3 mg/dL — ABNORMAL LOW (ref 8.9–10.3)
Chloride: 111 mmol/L (ref 98–111)
Creatinine, Ser: 4.09 mg/dL — ABNORMAL HIGH (ref 0.61–1.24)
GFR, Estimated: 13 mL/min — ABNORMAL LOW (ref >60)
Glucose, Bld: 104 mg/dL — ABNORMAL HIGH (ref 70–99)
Potassium: 4.2 mmol/L (ref 3.5–5.1)
Sodium: 148 mmol/L — ABNORMAL HIGH (ref 135–145)
Total Bilirubin: 0.8 mg/dL (ref 0.3–1.2)
Total Protein: 5.4 g/dL — ABNORMAL LOW (ref 6.5–8.1)

## 2022-10-29 LAB — CULTURE, BLOOD (ROUTINE X 2)
Culture: NO GROWTH
Culture: NO GROWTH
Special Requests: ADEQUATE
Special Requests: ADEQUATE

## 2022-10-29 LAB — CBC
HCT: 33.2 % — ABNORMAL LOW (ref 39.0–52.0)
Hemoglobin: 10.2 g/dL — ABNORMAL LOW (ref 13.0–17.0)
MCH: 30.5 pg (ref 26.0–34.0)
MCHC: 30.7 g/dL (ref 30.0–36.0)
MCV: 99.4 fL (ref 80.0–100.0)
Platelets: 168 10*3/uL (ref 150–400)
RBC: 3.34 MIL/uL — ABNORMAL LOW (ref 4.22–5.81)
RDW: 16.4 % — ABNORMAL HIGH (ref 11.5–15.5)
WBC: 12.7 10*3/uL — ABNORMAL HIGH (ref 4.0–10.5)
nRBC: 0 % (ref 0.0–0.2)

## 2022-10-29 LAB — GLUCOSE, CAPILLARY
Glucose-Capillary: 103 mg/dL — ABNORMAL HIGH (ref 70–99)
Glucose-Capillary: 132 mg/dL — ABNORMAL HIGH (ref 70–99)
Glucose-Capillary: 149 mg/dL — ABNORMAL HIGH (ref 70–99)
Glucose-Capillary: 173 mg/dL — ABNORMAL HIGH (ref 70–99)

## 2022-10-29 LAB — CK: Total CK: 105 U/L (ref 49–397)

## 2022-10-29 MED ORDER — METOPROLOL TARTRATE 25 MG PO TABS
25.0000 mg | ORAL_TABLET | Freq: Two times a day (BID) | ORAL | Status: DC
  Administered 2022-10-29 – 2022-10-30 (×2): 25 mg via ORAL
  Filled 2022-10-29 (×2): qty 1

## 2022-10-29 MED ORDER — SODIUM CHLORIDE 0.45 % IV SOLN: INTRAVENOUS | Status: AC

## 2022-10-29 NOTE — Progress Notes (Signed)
Progress Note   Patient: Victor Tapia. [id number] DOB: 1926-04-06 DOA: 11/12/22     5 DOS: the patient was seen and examined on 10/29/2022   Brief hospital admission narrative: As per H&P written by Dr. Carren Rang On 10/25/2022 Victor Tapia. is a 89 y.o. male with medical history significant of coronary artery disease, diabetes mellitus type 2, hyperlipidemia, hypertension, prostate cancer, presents the ED with a chief complaint of fall.  Sister and niece are at bedside.  Sister is attempting to be a historian, but is a poor historian.  She talks a lot about what she guesses happened, but does not actually know what happened.  She reports last time she talked to him was Thursday night.  She thinks his son talk to him sometime on Friday.  Patient was then found today, down on the cement floor in the garage.  He has pressure injuries from lying there and also friction injuries from rubbing his feet on the floor trying to get up.  Sister does report that when his son came in to visit him the whole family ended up with the flu.  He had a fever, headache, generalized weakness.  He was not coughing.  Those symptoms started about 2 weeks ago when he was getting better.  Patient lives alone because his wife died 6 weeks ago.  His daughter was staying with him for a while though and then his son stayed with him so he is actually been alone very little.  Patient is not able to give any history at this time.  Daughter in [place] is the POA and is discussing with other siblings to determine a CODE STATUS.  As far as anyone knows patient does not smoke or drink.   Assessment and Plan: 1)Severe Sepsis due to Pneumonia (HCC)--POA - Patient met criteria for severe sepsis with hypothermia, elevated heart rate, increased respiratory rate and elevated lactic acid.  Source of infection pneumonia.  Organ dysfunction hypoxia, acute on chronic renal failure. -Chest x-ray shows nondisplaced left 4th through 8th rib  fractures and right upper lobe infectious/inflammatory changes. - 10/29/22 -- No longer Requiring BiPAP, oxygen has been weaned down to 5 to 6 L per nasal cannula at this time -Overall respiratory status improving, much less tachypneic -Continue Rocephin/azithromycin, bronchodilators and mucolytics WBC--- 35.6 >>30.7 >>12.7  2)Acute left MCA/PCA strokes with right-sided weakness --MRI brain with acute strokes -Right hemiparesis persist -Neurology consult appreciated-atorvastatin, aspirin and Plavix recommended -Carotid artery Dopplers and echo report noted -Lipid profile with HDL of 24, LDL of 24, total cholesterol of 60 triglycerides of 59--- Even if his lipid panel is within desired limits, patient should still take Lipitor  for it's Pleiotropic effects (beyond cholesterol lowering benefits) -A1c 5.9  3)AKI----acute kidney injury on CKD stage -3B    -Baseline creatinine usually 1.2-1.4 -Creatinine peaked this admission at 4.93 -Creatinine currently improving with hydration  renally adjust medications, avoid nephrotoxic agents / dehydration  / hypotension  4)Hypernatremia--- sodium is up to 148, suspect due to free water deficit/dehydration =-Gentle IV hydration  5)Rhabdomyolysis -Found down for an unknown amount of time possibly 3 days - CPK was 2153 at time of admission -CK has normalized with hydration  Dysphagia--improving, try full liquid diet with nectar thickened liquids  Essential hypertension, benign -Change metoprolol to oral -As needed hydralazine also ordered.  Type 2 diabetes mellitus with stage 3 chronic kidney disease, without long-term current use of insulin (HCC) -A1c 5.9 reflecting excellent diabetic control PTA -Continue holding hold  Amaryl and Tradjenta Use Novolog/Humalog Sliding scale insulin with Accu-Cheks/Fingersticks as ordered     Hyperkalemia/metabolic acidosis due to AKI on CKD -In the setting of acute kidney injury most likely -Potassium within  normal limits currently. -Continue to follow trend -Continue to maintain adequate hydration and follow renal function response. -Bicarb is normalized  Pressure injury of skin - Appreciate assistance and recommendation by wound care service -Continue constant repositioning and preventive measures.  Subjective:  -Sister Eber Jones, brother Dorene Sorrow, grandson at bedside -Patient's daughter Lawson Fiscal is on speaker phone -Much more awake, respiratory status is improved -Tolerating liquid diet  Physical Exam: Vitals:   10/29/22 1200 10/29/22 1207 10/29/22 1300 10/29/22 1400  BP: (!) 149/53  (!) 125/42 (!) 123/42  Pulse: 89 (!) 142 77 82  Resp: 19 18 18 20   Temp:  98.4 F (36.9 C)    TempSrc:  Axillary    SpO2: 92% 91% 94% 93%  Weight:      Height:       Physical Exam  Gen:-Awake, oriented, cooperative, no respiratory distress  HEENT:- [place].AT, No sclera icterus Nose-  5L/min Neck-Supple Neck,No JVD,.  Lungs-  -improving air movement, few scattered rhonchi  CV- S1, S2 normal, RRR Abd-  +ve B.Sounds, Abd Soft, No tenderness,    Extremity/Skin:- No  edema,   good pedal pulses  Psych-affect is appropriate, oriented x3 Neuro-Rt-sided weakness, , no tremors  Family Communication: Brothers  and sister at bedside, daughter on speaker phone  Disposition: SNF Status is: Inpatient  Author: [name], MD 10/29/2022 2:31 PM  For on call review [email or web address].

## 2022-10-29 NOTE — Plan of Care (Signed)

## 2022-10-29 NOTE — Progress Notes (Signed)
  Interdisciplinary Goals of Care Family Meeting   Date carried out: 10/29/2022  Location of the meeting: Bedside  Member's involved: Physician, Bedside Registered Nurse, and Family Member or next of kin  Durable Power of Attorney or acting medical decision maker: [name]    Discussion: We discussed goals of care for [name]. .   -After discussion with patient/family (her daughter Mrs Victor Tapia, [name] and [name])   -Family request DNR/DNI status, Treat the treatable, BiPAP okay otherwise no heroic measures  Code status:   Code Status: Limited: Do not attempt resuscitation (DNR) -DNR-LIMITED -Do Not Intubate/DNI    Disposition: SNF/[facility]AC  Time spent for the meeting: 38   [name], MD  10/29/2022, 2:58 PM

## 2022-10-30 DIAGNOSIS — J189 Pneumonia, unspecified organism: Secondary | ICD-10-CM | POA: Diagnosis not present

## 2022-10-30 DIAGNOSIS — A419 Sepsis, unspecified organism: Secondary | ICD-10-CM | POA: Diagnosis not present

## 2022-10-30 LAB — GLUCOSE, CAPILLARY
Glucose-Capillary: 137 mg/dL — ABNORMAL HIGH (ref 70–99)
Glucose-Capillary: 137 mg/dL — ABNORMAL HIGH (ref 70–99)
Glucose-Capillary: 155 mg/dL — ABNORMAL HIGH (ref 70–99)

## 2022-10-30 LAB — CBC
HCT: 32.4 % — ABNORMAL LOW (ref 39.0–52.0)
Hemoglobin: 9.7 g/dL — ABNORMAL LOW (ref 13.0–17.0)
MCH: 30 pg (ref 26.0–34.0)
MCHC: 29.9 g/dL — ABNORMAL LOW (ref 30.0–36.0)
MCV: 100.3 fL — ABNORMAL HIGH (ref 80.0–100.0)
Platelets: 174 10*3/uL (ref 150–400)
RBC: 3.23 MIL/uL — ABNORMAL LOW (ref 4.22–5.81)
RDW: 16.1 % — ABNORMAL HIGH (ref 11.5–15.5)
WBC: 16.8 10*3/uL — ABNORMAL HIGH (ref 4.0–10.5)
nRBC: 0 % (ref 0.0–0.2)

## 2022-10-30 LAB — BASIC METABOLIC PANEL
Anion gap: 14 (ref 5–15)
BUN: 117 mg/dL — ABNORMAL HIGH (ref 8–23)
CO2: 20 mmol/L — ABNORMAL LOW (ref 22–32)
Calcium: 8.1 mg/dL — ABNORMAL LOW (ref 8.9–10.3)
Chloride: 111 mmol/L (ref 98–111)
Creatinine, Ser: 4.07 mg/dL — ABNORMAL HIGH (ref 0.61–1.24)
GFR, Estimated: 13 mL/min — ABNORMAL LOW (ref >60)
Glucose, Bld: 146 mg/dL — ABNORMAL HIGH (ref 70–99)
Potassium: 4 mmol/L (ref 3.5–5.1)
Sodium: 145 mmol/L (ref 135–145)

## 2022-10-30 MED ORDER — SODIUM CHLORIDE 0.45 % IV SOLN: INTRAVENOUS | Status: DC

## 2022-10-30 MED ORDER — SODIUM CHLORIDE 0.9 % IV SOLN: 2.0000 g | INTRAVENOUS | Status: DC

## 2022-10-30 MED ORDER — IPRATROPIUM-ALBUTEROL 0.5-2.5 (3) MG/3ML IN SOLN
3.0000 mL | Freq: Four times a day (QID) | RESPIRATORY_TRACT | Status: DC
  Administered 2022-10-30 – 2022-10-31 (×6): 3 mL via RESPIRATORY_TRACT
  Filled 2022-10-30 (×6): qty 3

## 2022-10-30 MED ORDER — ALBUTEROL SULFATE (2.5 MG/3ML) 0.083% IN NEBU: 2.5000 mg | INHALATION_SOLUTION | RESPIRATORY_TRACT | Status: DC | PRN

## 2022-10-30 MED ORDER — SODIUM CHLORIDE 0.9 % IV SOLN
2.0000 g | INTRAVENOUS | Status: DC
  Administered 2022-10-30: 2 g via INTRAVENOUS
  Filled 2022-10-30: qty 20

## 2022-10-30 NOTE — Progress Notes (Signed)
Progress Note  Patient: Victor Tapia. [id number] DOB: July 17, 1926 DOA: 10/26/2022     6 DOS: the patient was seen and examined on 10/30/2022   Brief hospital admission narrative: As per H&P written by Dr. Carren Rang On 10/25/2022 Victor Tapia. is a 89 y.o. male with medical history significant of coronary artery disease, diabetes mellitus type 2, hyperlipidemia, hypertension, prostate cancer, presents the ED with a chief complaint of fall.  Sister and niece are at bedside.  Sister is attempting to be a historian, but is a poor historian.  She talks a lot about what she guesses happened, but does not actually know what happened.  She reports last time she talked to him was Thursday night.  She thinks his son talk to him sometime on Friday.  Patient was then found today, down on the cement floor in the garage.  He has pressure injuries from lying there and also friction injuries from rubbing his feet on the floor trying to get up.  Sister does report that when his son came in to visit him the whole family ended up with the flu.  He had a fever, headache, generalized weakness.  He was not coughing.  Those symptoms started about 2 weeks ago when he was getting better.  Patient lives alone because his wife died 6 weeks ago.  His daughter was staying with him for a while though and then his son stayed with him so he is actually been alone very little.  Patient is not able to give any history at this time.  Daughter in [place] is the POA and is discussing with other siblings to determine a CODE STATUS.  As far as anyone knows patient does not smoke or drink.   Assessment and Plan: 1)Severe Sepsis due to Pneumonia (HCC)--POA - Patient met criteria for severe sepsis with hypothermia, elevated heart rate, increased respiratory rate and elevated lactic acid.  Source of infection pneumonia.  Organ dysfunction hypoxia, acute on chronic renal failure. -Chest x-ray shows nondisplaced left 4th through 8th rib  fractures and right upper lobe infectious/inflammatory changes. - 10/30/22 -- No longer Requiring BiPAP, oxygen has been weaned down to  6 L per nasal cannula at this time -??  Aspiration episode on 10/30/2022 after taking medications and fluids, WBC trending back up, oxygen requirement and RR trending back up a bit -Required suctioning and nebulizer treatment -Keep him n.p.o. except for medications at this time pending speech therapy eval -Continue Rocephin/azithromycin, bronchodilators and mucolytics WBC--- 35.6 >>30.7 >>12.7>>16.8  2)Acute left MCA/PCA strokes with right-sided weakness --MRI brain with acute strokes -Right hemiparesis persist -Neurology consult appreciated-atorvastatin, aspirin and Plavix recommended -Carotid artery Dopplers and echo report noted -Lipid profile with HDL of 24, LDL of 24, total cholesterol of 60 triglycerides of 59--- Even if his lipid panel is within desired limits, patient should still take Lipitor  for it's Pleiotropic effects (beyond cholesterol lowering benefits) -A1c 5.9 -Patient will need physical therapy rehab  3)AKI----acute kidney injury on CKD stage -3B    -Baseline creatinine usually 1.2-1.4 -Creatinine peaked this admission at 4.93 -Creatinine currently improving with hydration  renally adjust medications, avoid nephrotoxic agents / dehydration  / hypotension  4)Hypernatremia--- sodium peaked at 148, suspect due to free water deficit/dehydration =-Sodium normalized with gentle IV hydration  5)Rhabdomyolysis -Found down for an unknown amount of time possibly 3 days - CPK was 2153 at time of admission -CK has normalized with hydration  6)Dysphagia--improving, aspiration concerns with full liquid diet with nectar  thickened liquids -For now n.p.o. except for medications -Awaiting further speech pathology eval hopefully on 10/31/2022  Essential hypertension, benign -Change metoprolol to oral -As needed hydralazine also ordered.  Type 2  diabetes mellitus with stage 3 chronic kidney disease, without long-term current use of insulin (HCC) -A1c 5.9 reflecting excellent diabetic control PTA -Continue holding hold Amaryl and Tradjenta Use Novolog/Humalog Sliding scale insulin with Accu-Cheks/Fingersticks as ordered     Hyperkalemia/metabolic acidosis due to AKI on CKD -In the setting of acute kidney injury most likely Potassium peaked at 6.1 this admission- -potassium is currently normalized -Continue to maintain adequate hydration  -Bicarb 16 >> 20  Pressure injury of skin - Appreciate assistance and recommendation by wound care service -Continue constant repositioning and preventive measures.  Subjective:  -- Family at bedside, questions answered -??  Aspiration episode on 10/30/2022 after taking medications and fluids, WBC trending back up, oxygen requirement and RR trending back up a bit -Required suctioning and nebulizer treatment -No fevers   Physical Exam: Vitals:   10/30/22 0644 10/30/22 0700 10/30/22 0707 10/30/22 0747  BP:  (!) 155/48    Pulse:  81    Resp:  (!) 23    Temp:    97.8 F (36.6 C)  TempSrc:    Axillary  SpO2:  (!) 89% 91%   Weight: 81.5 kg     Height:       Physical Exam  Gen:-Awake, oriented, cooperative, no respiratory distress  HEENT:- [place].AT, No sclera icterus Nose- [place] 6L/min Neck-Supple Neck,No JVD,.  Lungs-  -somewhat diminished on the right, t, few scattered rhonchi on the Rt CV- S1, S2 normal, RRR Abd-  +ve B.Sounds, Abd Soft, No tenderness,    Extremity/Skin:- No significant edema,   good pedal pulses  Psych-affect is appropriate, oriented x3 Neuro-Rt-sided weakness, , no tremors  Family Communication: Brothers  and sister at bedside, daughter on speaker phone  Disposition: SNF Status is: Inpatient  Author: [name], MD 10/30/2022 9:31 AM  For on call review [email or web address].

## 2022-10-30 NOTE — Plan of Care (Signed)

## 2022-10-30 NOTE — Progress Notes (Signed)
Restarted nebs with duoneb

## 2022-10-31 DIAGNOSIS — Z7189 Other specified counseling: Secondary | ICD-10-CM | POA: Diagnosis not present

## 2022-10-31 DIAGNOSIS — Z515 Encounter for palliative care: Secondary | ICD-10-CM | POA: Diagnosis not present

## 2022-10-31 DIAGNOSIS — J189 Pneumonia, unspecified organism: Secondary | ICD-10-CM | POA: Diagnosis not present

## 2022-10-31 DIAGNOSIS — A419 Sepsis, unspecified organism: Secondary | ICD-10-CM | POA: Diagnosis not present

## 2022-10-31 LAB — RENAL FUNCTION PANEL
Albumin: 2 g/dL — ABNORMAL LOW (ref 3.5–5.0)
Anion gap: 11 (ref 5–15)
BUN: 119 mg/dL — ABNORMAL HIGH (ref 8–23)
CO2: 20 mmol/L — ABNORMAL LOW (ref 22–32)
Calcium: 8 mg/dL — ABNORMAL LOW (ref 8.9–10.3)
Chloride: 112 mmol/L — ABNORMAL HIGH (ref 98–111)
Creatinine, Ser: 3.93 mg/dL — ABNORMAL HIGH (ref 0.61–1.24)
GFR, Estimated: 13 mL/min — ABNORMAL LOW (ref >60)
Glucose, Bld: 114 mg/dL — ABNORMAL HIGH (ref 70–99)
Phosphorus: 5 mg/dL — ABNORMAL HIGH (ref 2.5–4.6)
Potassium: 4.2 mmol/L (ref 3.5–5.1)
Sodium: 143 mmol/L (ref 135–145)

## 2022-10-31 LAB — CBC
HCT: 30.9 % — ABNORMAL LOW (ref 39.0–52.0)
Hemoglobin: 9.6 g/dL — ABNORMAL LOW (ref 13.0–17.0)
MCH: 30.8 pg (ref 26.0–34.0)
MCHC: 31.1 g/dL (ref 30.0–36.0)
MCV: 99 fL (ref 80.0–100.0)
Platelets: 188 10*3/uL (ref 150–400)
RBC: 3.12 MIL/uL — ABNORMAL LOW (ref 4.22–5.81)
RDW: 16.3 % — ABNORMAL HIGH (ref 11.5–15.5)
WBC: 27.7 10*3/uL — ABNORMAL HIGH (ref 4.0–10.5)
nRBC: 0.1 % (ref 0.0–0.2)

## 2022-10-31 LAB — GLUCOSE, CAPILLARY
Glucose-Capillary: 103 mg/dL — ABNORMAL HIGH (ref 70–99)
Glucose-Capillary: 105 mg/dL — ABNORMAL HIGH (ref 70–99)

## 2022-10-31 LAB — MAGNESIUM: Magnesium: 2.2 mg/dL (ref 1.7–2.4)

## 2022-10-31 MED ORDER — ACETAMINOPHEN 650 MG RE SUPP: 650.0000 mg | Freq: Four times a day (QID) | RECTAL | Status: DC | PRN

## 2022-10-31 MED ORDER — GLYCOPYRROLATE 1 MG PO TABS: 1.0000 mg | ORAL_TABLET | ORAL | Status: DC | PRN

## 2022-10-31 MED ORDER — ONDANSETRON 4 MG PO TBDP: 4.0000 mg | ORAL_TABLET | Freq: Four times a day (QID) | ORAL | Status: DC | PRN

## 2022-10-31 MED ORDER — LORAZEPAM 2 MG/ML IJ SOLN
1.0000 mg | INTRAMUSCULAR | Status: DC | PRN
  Administered 2022-10-31: 1 mg via INTRAVENOUS
  Filled 2022-10-31: qty 1

## 2022-10-31 MED ORDER — ONDANSETRON HCL 4 MG/2ML IJ SOLN: 4.0000 mg | Freq: Four times a day (QID) | INTRAMUSCULAR | Status: DC | PRN

## 2022-10-31 MED ORDER — LORAZEPAM 1 MG PO TABS: 1.0000 mg | ORAL_TABLET | ORAL | Status: DC | PRN

## 2022-10-31 MED ORDER — HALOPERIDOL LACTATE 2 MG/ML PO CONC: 0.5000 mg | ORAL | Status: DC | PRN

## 2022-10-31 MED ORDER — GLYCOPYRROLATE 0.2 MG/ML IJ SOLN
0.2000 mg | INTRAMUSCULAR | Status: DC | PRN
  Administered 2022-10-31 – 2022-11-01 (×3): 0.2 mg via INTRAVENOUS
  Filled 2022-10-31 (×3): qty 1

## 2022-10-31 MED ORDER — HALOPERIDOL 0.5 MG PO TABS: 0.5000 mg | ORAL_TABLET | ORAL | Status: DC | PRN

## 2022-10-31 MED ORDER — HALOPERIDOL LACTATE 5 MG/ML IJ SOLN: 0.5000 mg | INTRAMUSCULAR | Status: DC | PRN

## 2022-10-31 MED ORDER — LORAZEPAM 2 MG/ML PO CONC: 1.0000 mg | ORAL | Status: DC | PRN

## 2022-10-31 MED ORDER — POLYVINYL ALCOHOL 1.4 % OP SOLN: 1.0000 [drp] | Freq: Four times a day (QID) | OPHTHALMIC | Status: DC | PRN

## 2022-10-31 MED ORDER — GLYCOPYRROLATE 0.2 MG/ML IJ SOLN: 0.2000 mg | INTRAMUSCULAR | Status: DC | PRN

## 2022-10-31 MED ORDER — ACETAMINOPHEN 325 MG PO TABS: 650.0000 mg | ORAL_TABLET | Freq: Four times a day (QID) | ORAL | Status: DC | PRN

## 2022-10-31 MED ORDER — FENTANYL CITRATE PF 50 MCG/ML IJ SOSY
25.0000 ug | PREFILLED_SYRINGE | INTRAMUSCULAR | Status: DC | PRN
  Administered 2022-10-31 – 2022-11-01 (×4): 50 ug via INTRAVENOUS
  Filled 2022-10-31 (×4): qty 1

## 2022-10-31 MED ORDER — BIOTENE DRY MOUTH MT LIQD: 15.0000 mL | OROMUCOSAL | Status: DC | PRN

## 2022-10-31 NOTE — Progress Notes (Signed)
[facility] Progress Note  Patient: Victor Tapia.    [id number]:[id number]  DOA: 13-Nov-2022     Date of Service: the patient was seen and examined on 10/31/2022  Chief Complaint  Patient presents with   Altered Mental Status   Fall   Brief hospital course: As per H&P written by Dr. Carren Rang On 10/25/2022 Victor Tapia. is a 89 y.o. male with medical history significant of coronary artery disease, diabetes mellitus type 2, hyperlipidemia, hypertension, prostate cancer, presents the ED with a chief complaint of fall.  Sister and niece are at bedside.  Sister is attempting to be a historian, but is a poor historian.  She talks a lot about what she guesses happened, but does not actually know what happened.  She reports last time she talked to him was Thursday night.  She thinks his son talk to him sometime on Friday.  Patient was then found today, down on the cement floor in the garage.  He has pressure injuries from lying there and also friction injuries from rubbing his feet on the floor trying to get up.  Sister does report that when his son came in to visit him the whole family ended up with the flu.  He had a fever, headache, generalized weakness.  He was not coughing.  Those symptoms started about 2 weeks ago when he was getting better.  Patient lives alone because his wife died 6 weeks ago.  His daughter was staying with him for a while though and then his son stayed with him so he is actually been alone very little.  Patient is not able to give any history at this time.  Daughter in [place] is the POA and is discussing with other siblings to determine a CODE STATUS.  As far as anyone knows patient does not smoke or drink.   Assessment and Plan:   Comfort measures only On 9/23 due to worsening of condition and poor prognosis, goals of care discussed with family and they agreed for comfort measures only. Palliative care consult appreciated, patient was started on comfort measures  only Patient may not qualify for hospice at home due to severity of illness, patient may pass away during hospital stay so we will continue to monitor.  Discontinued all medications and transition to comfort measures only on 9/23  # Severe Sepsis due to Pneumonia (HCC)--POA Patient met criteria for severe sepsis with hypothermia, elevated heart rate, increased respiratory rate and elevated lactic acid.  Source of infection pneumonia.  Organ dysfunction hypoxia, acute on chronic renal failure. Chest x-ray shows nondisplaced left 4th through 8th rib fractures and right upper lobe infectious/inflammatory changes. No longer Requiring BiPAP, oxygen has been weaned down to  2 L per nasal cannula at this time -??  Aspiration episode on 10/30/2022 after taking medications and fluids, WBC trending back up, oxygen requirement and RR trending back up a bit, O2 6 L weaned down to 2L now -Required suctioning and nebulizer treatment -Keep him n.p.o.  S/p Rocephin/azithromycin, bronchodilators and mucolytics WBC--- 35.6 >>30.7 >>12.7>>16.8  # Acute left MCA/PCA strokes with right-sided weakness --MRI brain with acute strokes -Right hemiparesis persist -Neurology consult appreciated-atorvastatin, aspirin and Plavix recommended -Carotid artery Dopplers and echo report noted -Lipid profile with HDL of 24, LDL of 24, total cholesterol of 60 triglycerides of 59--- Even if his lipid panel is within desired limits, patient should still take Lipitor  for it's Pleiotropic effects (beyond cholesterol lowering benefits) A1c 5.9    #  AKI----acute kidney injury on CKD stage -3B    -Baseline creatinine usually 1.2-1.4 -Creatinine peaked this admission at 4.93 -Creatinine improved with hydration # Hypernatremia--- sodium peaked at 148, suspect due to free water deficit/dehydration Sodium normalized with gentle IV hydration # Rhabdomyolysis: Found down for an unknown amount of time possibly 3 days CPK was 2153 at  time of admission. CK has normalized with hydration # Dysphagia--aspiration, keep NOP # Essential hypertension, benign, s/p metoprolol to oral.  # Type 2 diabetes mellitus with stage 3 chronic kidney disease, without long-term current use of insulin: A1c 5.9 reflecting excellent diabetic control PTA. holding hold Amaryl and Tradjenta S/p Novolog/Humalog Sliding scale insulin with Accu-Cheks/Fingersticks as ordered    # Hyperkalemia/metabolic acidosis due to AKI on CKD, In the setting of acute kidney injury most likely. Potassium peaked at 6.1 this admission, potassium is currently normalized Bicarb 16 >> 20 # Pressure injury of skin: Wound care consulted. Continue constant repositioning and preventive measures.   Body mass index is 26.5 kg/m.  Interventions:  Pressure Injury 10/25/22 Heel Left Deep Tissue Pressure Injury - Purple or maroon localized area of discolored intact skin or blood-filled blister due to damage of underlying soft tissue from pressure and/or shear. (Active)  10/25/22 0200  Location: Heel  Location Orientation: Left  Staging: Deep Tissue Pressure Injury - Purple or maroon localized area of discolored intact skin or blood-filled blister due to damage of underlying soft tissue from pressure and/or shear.  Wound Description (Comments):   Present on Admission: Yes  Dressing Type Silicone dressing;Impregnated gauze (petrolatum) 10/31/22 0545     Pressure Injury 10/25/22 Ankle Right;Medial;Upper;Anterior Stage 2 -  Partial thickness loss of dermis presenting as a shallow open injury with a red, pink wound bed without slough. (Active)  10/25/22 0200  Location: Ankle  Location Orientation: Right;Medial;Upper;Anterior  Staging: Stage 2 -  Partial thickness loss of dermis presenting as a shallow open injury with a red, pink wound bed without slough.  Wound Description (Comments):   Present on Admission: Yes  Dressing Type Gauze (Comment);Honey;Silicone dressing 10/31/22 0545      Pressure Injury 10/25/22 Thigh Anterior;Proximal;Right Stage 2 -  Partial thickness loss of dermis presenting as a shallow open injury with a red, pink wound bed without slough. (Active)  10/25/22 0200  Location: Thigh  Location Orientation: Anterior;Proximal;Right  Staging: Stage 2 -  Partial thickness loss of dermis presenting as a shallow open injury with a red, pink wound bed without slough.  Wound Description (Comments):   Present on Admission: Yes  Dressing Type Silicone dressing;Impregnated gauze (petrolatum) 10/31/22 0545     Pressure Injury 11/05/2022 Sacrum Mid Deep Tissue Pressure Injury - Purple or maroon localized area of discolored intact skin or blood-filled blister due to damage of underlying soft tissue from pressure and/or shear. 12 cm x 7 cm intact purple maroon disc (Active)  10/25/2022   Location: Sacrum  Location Orientation: Mid  Staging: Deep Tissue Pressure Injury - Purple or maroon localized area of discolored intact skin or blood-filled blister due to damage of underlying soft tissue from pressure and/or shear.  Wound Description (Comments): 12 cm x 7 cm intact purple maroon discoloration  Present on Admission: Yes  Dressing Type Silicone dressing;Impregnated gauze (petrolatum) 10/31/22 0545     Pressure Injury 10/25/22 Ankle Right;Lateral Stage 2 -  Partial thickness loss of dermis presenting as a shallow open injury with a red, pink wound bed without slough. (Active)  10/25/22 0800  Location: Ankle  Location  Orientation: Right;Lateral  Staging: Stage 2 -  Partial thickness loss of dermis presenting as a shallow open injury with a red, pink wound bed without slough.  Wound Description (Comments):   Present on Admission: Yes  Dressing Type Gauze (Comment);Silicone dressing;Honey 10/31/22 0545     Pressure Injury 10/25/22 Elbow Right Unstageable - Full thickness tissue loss in which the base of the injury is covered by slough (yellow, tan, gray, green or brown)  and/or eschar (tan, brown or black) in the wound bed. (Active)  10/25/22 0800  Location: Elbow  Location Orientation: Right  Staging: Unstageable - Full thickness tissue loss in which the base of the injury is covered by slough (yellow, tan, gray, green or brown) and/or eschar (tan, brown or black) in the wound bed.  Wound Description (Comments):   Present on Admission: Yes  Dressing Type Silicone dressing;Honey;Gauze (Comment) 10/31/22 0545     Diet: NPO DVT Prophylaxis: SCD comfort care   Advance goals of care discussion: Limited code/comfort measures only  Family Communication: family was present at bedside, at the time of interview.  The pt provided permission to discuss medical plan with the family. Opportunity was given to ask question and all questions were answered satisfactorily.   Disposition:  Pt is from Home, admitted with CVA, poor prognosis, transition to comfort measures only on 9/23. Patient is not stable, poor prognosis, may not be a good candidate to transport to home hospice, so we will continue to monitor.  May pass away during hospital stay.  Subjective: No significant events overnight, patient had an episode of aspiration yesterday, oxygen saturation dropped, patient was on 6 L oxygen, which weaned down to 2 L but patient's breathing is not good and very lethargic, seems in a dying process. Discussed with family at bedside who agreed with a comfort measures only  Physical Exam: General: Very lethargic and obtunded, agonal breathing Eyes: PERRLA ENT: Oral Mucosa Clear, moist  Neck: no JVD,  Cardiovascular: S1 and S2 Present, no Murmur,  Respiratory: Respiratory distress, agonal breathing, conducting sounds due to secretions. Abdomen: Bowel Sound present, Soft and no tenderness,  Skin: no rashes Extremities: no Pedal edema, no calf tenderness Neurologic: Right hemiparesis due to acute CVA Gait not checked due to patient safety concerns  Vitals:   10/31/22  1000 10/31/22 1142 10/31/22 1452 10/31/22 1500  BP: (!) 171/81     Pulse:  (!) 123 (!) 129 (!) 118  Resp:  (!) 21 19 20   Temp:      TempSrc:      SpO2:  91% 93% (!) 87%  Weight:      Height:        Intake/Output Summary (Last 24 hours) at 10/31/2022 1625 Last data filed at 10/31/2022 0850 Gross per 24 hour  Intake 1938.93 ml  Output 900 ml  Net 1038.93 ml   Filed Weights   10/25/22 0400 10/30/22 0644 10/31/22 0613  Weight: 74.8 kg 81.5 kg 81.4 kg    Data Reviewed: I have personally reviewed and interpreted daily labs, tele strips, imagings as discussed above. I reviewed all nursing notes, pharmacy notes, vitals, pertinent old records I have discussed plan of care as described above with RN and patient/family.  CBC: Recent Labs  Lab 25-Oct-2022 1746 10/25/22 0139 10/29/22 0409 10/30/22 0443 10/31/22 0503  WBC 35.6* 30.7* 12.7* 16.8* 27.7*  NEUTROABS 32.8* 28.9*  --   --   --   HGB 14.4 11.9* 10.2* 9.7* 9.6*  HCT 44.8 37.4* 33.2*  32.4* 30.9*  MCV 96.3 95.9 99.4 100.3* 99.0  PLT 220 202 168 174 188   Basic Metabolic Panel: Recent Labs  Lab 10/25/22 0139 10/26/22 0840 10/27/22 1024 10/29/22 0409 10/30/22 0443 10/31/22 0503 10/31/22 1030  NA 139 143 142 148* 145 143  --   K 5.9* 5.1 4.6 4.2 4.0 4.2  --   CL 103 106 108 111 111 112*  --   CO2 13* 20* 19* 23 20* 20*  --   GLUCOSE 234* 133* 195* 104* 146* 114*  --   BUN 110* 113* 100* 110* 117* 119*  --   CREATININE 4.76* 4.49* 3.94* 4.09* 4.07* 3.93*  --   CALCIUM 7.6* 7.5* 7.2* 8.3* 8.1* 8.0*  --   MG 2.4  --   --   --   --   --  2.2  PHOS  --   --   --   --   --  5.0*  --     Studies: No results found.  Scheduled Meds:  leptospermum manuka honey  1 Application Topical Daily   Continuous Infusions: PRN Meds: acetaminophen **OR** acetaminophen, albuterol, antiseptic oral rinse, fentaNYL (SUBLIMAZE) injection, glycopyrrolate **OR** glycopyrrolate **OR** glycopyrrolate, haloperidol **OR** haloperidol **OR**  haloperidol lactate, LORazepam **OR** [DISCONTINUED] LORazepam **OR** LORazepam, ondansetron **OR** ondansetron (ZOFRAN) IV, mouth rinse, polyvinyl alcohol  Time spent: 35 minutes  Author: Gillis Santa. MD [facility] 10/31/2022 4:25 PM  To reach On-call, see care teams to locate the attending and reach out to them via [email or web address]. If 7PM-7AM, please contact night-coverage If you still have difficulty reaching the attending provider, please page the [facility] (Director on Call) for [facility] on amion for assistance.

## 2022-10-31 NOTE — Progress Notes (Signed)
Palliative:  Face-to-face meeting with attending and bedside nursing staff.  Attending shares that family has elected comfort care.  Mr. Rayford is lying quietly in bed.  He appears acutely ill.  He does not interact with any meaningful way.  He does not respond to my orientation questions.  He clearly cannot make his basic needs known.  His brother and son-in-law Rosanne Ashing are present at bedside.  Comfort channel placed on television.  Meeting with daughter, Sharol Harness in family room.  She is speaking with her sister, Mr. Klausen daughter Clydie Braun.  We talked about comfort measures only, we talked about unburdening Mr. Claw for medications and treatments that are painful.  We talked about increasing medications for pain, anxiety, breathlessness.  I share that it seems Mr. Selinsky time is short.  It seems that he is too unstable for transition to residential hospice care, Lawson Fiscal agrees.  Conference with attending, bedside nursing staff, transition of care team related to patient condition, needs, goals of care, disposition.  Plan:  FULL COMFORT CARE.  End of life order set in place. Anticipate in hospital death.  Prognosis: Anticipate hours to days, in-hospital death as too unstable for transport.  50 minutes  Lillia Carmel, NP Palliative Medicine Team  Team Phone [phone] Greater than 50% of this time was spent counseling and coordinating care related to the above assessment and plan.

## 2022-11-01 DIAGNOSIS — A419 Sepsis, unspecified organism: Secondary | ICD-10-CM | POA: Diagnosis not present

## 2022-11-01 DIAGNOSIS — J189 Pneumonia, unspecified organism: Secondary | ICD-10-CM | POA: Diagnosis not present

## 2022-11-08 NOTE — Progress Notes (Signed)
Notified by daughter that patient had stopped breathing. Upon assessment patient had no heartbeat and no respirations. Verified by  Sundra Aland, RN and Alfonso Ramus, RN.

## 2022-11-08 NOTE — Plan of Care (Signed)
Problem: Coping: Goal: Level of anxiety will decrease Outcome: Progressing   Problem: Pain Managment: Goal: General experience of comfort will improve Outcome: Progressing   Problem: Skin Integrity: Goal: Risk for impaired skin integrity will decrease Outcome: Progressing

## 2022-11-08 NOTE — Discharge Summary (Signed)
[facility] Discharge Summary   Death Summary   Patient: Victor Tapia. KVQ:[id number]  PCP: Mechele Claude, MD  Date of admission: 11/06/2022   Date of discharge: Nov 14, 2022 2022-11-14     Discharge Diagnoses:  Principal Problem:   Sepsis due to pneumonia [facility]) Active Problems:   Type 2 diabetes mellitus with stage 3 chronic kidney disease, without long-term current use of insulin (HCC)   Hyperlipemia   Essential hypertension, benign   Rhabdomyolysis   Pressure injury of skin   AKI (acute kidney injury) (HCC)   Hyperkalemia   Admitted From: Home Disposition:  Deceased  Date of Death: November 14, 2022 Time of Death: 0700 Cause of Death: Sepsis due to pneumonia and acute Left MCA stroke     Discharge Condition: Deceased  Code Status: Comfort care only  History of present illness: As per the H and P dictated on admission Hospital Course:  As per H&P written by Dr. Carren Rang On 10/25/2022 Victor Tapia. is a 89 y.o. male with medical history significant of coronary artery disease, diabetes mellitus type 2, hyperlipidemia, hypertension, prostate cancer, presents the ED with a chief complaint of fall.  Sister and niece are at bedside.  Sister is attempting to be a historian, but is a poor historian.  She talks a lot about what she guesses happened, but does not actually know what happened.  She reports last time she talked to him was Thursday night.  She thinks his son talk to him sometime on Friday.  Patient was then found today, down on the cement floor in the garage.  He has pressure injuries from lying there and also friction injuries from rubbing his feet on the floor trying to get up.  Sister does report that when his son came in to visit him the whole family ended up with the flu.  He had a fever, headache, generalized weakness.  He was not coughing.  Those symptoms started about 2 weeks ago when he was getting better.  Patient lives alone because his wife died 6 weeks ago.  His  daughter was staying with him for a while though and then his son stayed with him so he is actually been alone very little.  Patient is not able to give any history at this time.  Daughter in [place] is the POA and is discussing with other siblings to determine a CODE STATUS.  As far as anyone knows patient does not smoke or drink.    Patient was admitted on 06-Nov-2022 due to acute left MCA stroke with right-sided paralysis.  I started taking care of this patient on 10/31/2022.  Please review the notes for details.  Further management as below.  Assessment and Plan:   # Comfort measures only On 9/23 due to worsening of condition and poor prognosis, goals of care discussed with family and they agreed for comfort measures only. Palliative care consult appreciated, patient was started on comfort measures only. Patient did not qualify for hospice medical facility transfer due to severity of illness, patient was expected to pass away during hospital stay.  Patient expired today on 11-14-2022 at 7 AM, death was pronounced by RN.     Discontinued all medications and transition to comfort measures only on 9/23   # Severe Sepsis due to Pneumonia (HCC)--POA Patient met criteria for severe sepsis with hypothermia, elevated heart rate, increased respiratory rate and elevated lactic acid.  Source of infection pneumonia.  Organ dysfunction hypoxia, acute on chronic renal failure. Chest x-ray shows  nondisplaced left 4th through 8th rib fractures and right upper lobe infectious/inflammatory changes. No longer Requiring BiPAP, oxygen has been weaned down to  2 L per nasal cannula at this time -??  Aspiration episode on 10/30/2022 after taking medications and fluids, WBC trending back up, oxygen requirement and RR trending back up a bit, O2 6 L weaned down to 2L -Required suctioning and nebulizer treatment -Kept n.p.o.  S/p Rocephin/azithromycin, bronchodilators and mucolytics WBC--- 35.6 >>30.7 >>12.7>>16.8   #  Acute left MCA/PCA strokes with right-sided weakness MRI brain with acute strokes.  Patient had right hemiparesis.  Neurology consulted, and pt was given atorvastatin, aspirin and Plavix. Carotid artery Dopplers and echo report noted Lipid profile with HDL of 24, LDL of 24, Tch 60, TG 59. A1c 5.9 # AKI----acute kidney injury on CKD stage -3B    Baseline creatinine usually 1.2-1.4, Cr peaked 4.93, Cr improved with hydration # Hypernatremia--- sodium peaked at 148, suspect due to free water deficit/dehydration, Sodium normalized with gentle IV hydration # Rhabdomyolysis: Found down for an unknown amount of time possibly 3 days. CPK was 2153 at time of admission. CK has normalized with hydration # Dysphagia--aspiration, keep NOP # Essential hypertension, benign, s/p metoprolol to oral.  # Type 2 diabetes mellitus with stage 3 chronic kidney disease, without long-term current use of insulin: A1c 5.9 reflecting excellent diabetic control PTA. Held Amaryl and Tradjenta. S/p Novolog/Humalog Sliding scale insulin with Accu-Cheks/Fingersticks as ordered    # Hyperkalemia/metabolic acidosis due to AKI on CKD, In the setting of acute kidney injury most likely. Potassium peaked at 6.1 this admission, potassium is currently normalized. Bicarb 16 >> 20 # Pressure injury of skin: Wound care consulted. Continue constant repositioning and preventive measures  Body mass index is 26.5 kg/m.  Nutrition Interventions:  Pressure Injury 10/25/22 Heel Left Deep Tissue Pressure Injury - Purple or maroon localized area of discolored intact skin or blood-filled blister due to damage of underlying soft tissue from pressure and/or shear. (Active)  10/25/22 0200  Location: Heel  Location Orientation: Left  Staging: Deep Tissue Pressure Injury - Purple or maroon localized area of discolored intact skin or blood-filled blister due to damage of underlying soft tissue from pressure and/or shear.  Wound Description  (Comments):   Present on Admission: Yes  Dressing Type Foam - Lift dressing to assess site every shift 10/31/22 1945     Pressure Injury 10/25/22 Ankle Right;Medial;Upper;Anterior Stage 2 -  Partial thickness loss of dermis presenting as a shallow open injury with a red, pink wound bed without slough. (Active)  10/25/22 0200  Location: Ankle  Location Orientation: Right;Medial;Upper;Anterior  Staging: Stage 2 -  Partial thickness loss of dermis presenting as a shallow open injury with a red, pink wound bed without slough.  Wound Description (Comments):   Present on Admission: Yes  Dressing Type Gauze (Comment) 10/31/22 1945     Pressure Injury 10/25/22 Thigh Anterior;Proximal;Right Stage 2 -  Partial thickness loss of dermis presenting as a shallow open injury with a red, pink wound bed without slough. (Active)  10/25/22 0200  Location: Thigh  Location Orientation: Anterior;Proximal;Right  Staging: Stage 2 -  Partial thickness loss of dermis presenting as a shallow open injury with a red, pink wound bed without slough.  Wound Description (Comments):   Present on Admission: Yes  Dressing Type Foam - Lift dressing to assess site every shift 10/31/22 1945     Pressure Injury 11/03/2022 Sacrum Mid Deep Tissue Pressure Injury - Purple or  maroon localized area of discolored intact skin or blood-filled blister due to damage of underlying soft tissue from pressure and/or shear. 12 cm x 7 cm intact purple maroon disc (Active)  10/21/2022   Location: Sacrum  Location Orientation: Mid  Staging: Deep Tissue Pressure Injury - Purple or maroon localized area of discolored intact skin or blood-filled blister due to damage of underlying soft tissue from pressure and/or shear.  Wound Description (Comments): 12 cm x 7 cm intact purple maroon discoloration  Present on Admission: Yes  Dressing Type Impregnated gauze (petrolatum);Silicone dressing 10/31/22 1700     Pressure Injury 10/25/22 Ankle Right;Lateral  Stage 2 -  Partial thickness loss of dermis presenting as a shallow open injury with a red, pink wound bed without slough. (Active)  10/25/22 0800  Location: Ankle  Location Orientation: Right;Lateral  Staging: Stage 2 -  Partial thickness loss of dermis presenting as a shallow open injury with a red, pink wound bed without slough.  Wound Description (Comments):   Present on Admission: Yes  Dressing Type Foam - Lift dressing to assess site every shift 10/31/22 1945     Pressure Injury 10/25/22 Elbow Right Unstageable - Full thickness tissue loss in which the base of the injury is covered by slough (yellow, tan, gray, green or brown) and/or eschar (tan, brown or black) in the wound bed. (Active)  10/25/22 0800  Location: Elbow  Location Orientation: Right  Staging: Unstageable - Full thickness tissue loss in which the base of the injury is covered by slough (yellow, tan, gray, green or brown) and/or eschar (tan, brown or black) in the wound bed.  Wound Description (Comments):   Present on Admission: Yes  Dressing Type Silicone dressing;Honey 10/31/22 1700     Consultants: Neurology Procedures: None  Patient passed away around 7 AM 11/29/22, death was pronounced by RN.   Filed Weights   10/25/22 0400 10/30/22 0644 10/31/22 0613  Weight: 74.8 kg 81.5 kg 81.4 kg   Vitals:   10/31/22 1615 Nov 29, 2022 0432  BP:  (!) 118/54  Pulse: (!) 133 (!) 112  Resp: (!) 24 (!) 34  Temp:  (!) 97.5 F (36.4 C)  SpO2: (!) 82% (!) 64%    DISCHARGE MEDICATION:   None  No Known Allergies   The results of significant diagnostics from this hospitalization (including imaging, microbiology, ancillary and laboratory) are listed below for reference.    Significant Diagnostic Studies: DG CHEST PORT 1 VIEW  Result Date: 10/29/2022 CLINICAL DATA:  [id number] Pneumonia [id number] EXAM: PORTABLE CHEST 1 VIEW COMPARISON:  October 23, 2012 FINDINGS: The cardiomediastinal silhouette is unchanged in  contour.Status post median sternotomy. Small bilateral pleural effusions. No pneumothorax. Increased RIGHT basilar airspace opacity. Patchy platelike opacities of the LEFT lung base, favored atelectasis. Bone island of the RIGHT clavicle overlapping the apex. IMPRESSION: Small bilateral pleural effusions with increased RIGHT basilar airspace opacity which could reflect atelectasis versus infection. Electronically Signed   By: Meda Klinefelter M.D.   On: 10/29/2022 09:56   US Carotid Bilateral  Result Date: 10/29/2022 CLINICAL DATA:  Stroke, right-sided weakness EXAM: BILATERAL CAROTID DUPLEX ULTRASOUND TECHNIQUE: Wallace Cullens scale imaging, color Doppler and duplex ultrasound were performed of bilateral carotid and vertebral arteries in the neck. COMPARISON:  None Available. FINDINGS: Criteria: Quantification of carotid stenosis is based on velocity parameters that correlate the residual internal carotid diameter with NASCET-based stenosis levels, using the diameter of the distal internal carotid lumen as the denominator for stenosis measurement. The following velocity measurements were  obtained: RIGHT ICA: 134/22 cm/sec CCA: 97/12 cm/sec SYSTOLIC ICA/CCA RATIO:  1.4 ECA:  157 cm/sec LEFT ICA: Occluded CCA: 115/7 cm/sec SYSTOLIC ICA/CCA RATIO:  Not applicable ECA:  321 cm/sec RIGHT CAROTID ARTERY: Moderate heterogeneous atherosclerotic plaque in the proximal internal carotid artery. By peak systolic velocity criteria, the estimated stenosis falls in the 50-69% diameter range. RIGHT VERTEBRAL ARTERY:  Patent with normal antegrade flow. LEFT CAROTID ARTERY: Complete occlusion of the internal carotid artery. Bulky atherosclerotic plaque extends into the origin of the external carotid artery resulting in a greater than 70% diameter stenosis. LEFT VERTEBRAL ARTERY:  Patent with antegrade flow. IMPRESSION: 1. Chronic occlusion of the left internal carotid artery. 2. Mild (1-49%) stenosis proximal right internal carotid  artery secondary to moderate heterogeneous atherosclerotic plaque. 3. Vertebral arteries remain patent with antegrade flow. Electronically Signed   By: Malachy Moan M.D.   On: 10/29/2022 07:25   ECHOCARDIOGRAM COMPLETE  Result Date: 10/28/2022    ECHOCARDIOGRAM REPORT   Patient Name:   Victor Tapia. Date of Exam: 10/28/2022 Medical Rec #:  [id number]       Height:       69.0 in Accession #:    [id number]      Weight:       164.9 lb Date of Birth:  11/15/1926        BSA:          1.903 m Patient Age:    89 years        BP:           141/44 mmHg Patient Gender: M               HR:           96 bpm. [facility] Procedure: 2D Echo, Cardiac Doppler and Color Doppler Indications:    Stroke I63.9  History:        Patient has no prior history of Echocardiogram examinations.                 CAD; Risk Factors:Hypertension, Diabetes and Dyslipidemia.  Sonographer:    Lucendia Herrlich RCS Referring Phys: [id number] COURAGE EMOKPAE IMPRESSIONS  1. Left ventricular ejection fraction, by estimation, is 60 to 65%. The left ventricle has normal function. The left ventricle has no regional wall motion abnormalities. Left ventricular diastolic parameters are consistent with Grade I diastolic dysfunction (impaired relaxation).  2. Right ventricular systolic function was not well visualized. The right ventricular size is normal. There is moderately elevated pulmonary artery systolic pressure.  3. The mitral valve is normal in structure. No evidence of mitral valve regurgitation. No evidence of mitral stenosis.  4. The aortic valve has an indeterminant number of cusps. There is moderate calcification of the aortic valve. Aortic valve regurgitation is not visualized. Mild aortic valve stenosis. Aortic valve area, by VTI measures 1.52 cm. Aortic valve mean gradient measures 15.0 mmHg. Aortic valve Vmax measures 2.61 m/s.  5. The inferior vena cava is normal in size with greater than 50% respiratory variability,  suggesting right atrial pressure of 3 mmHg.  6. Cannot exclude a small PFO. Comparison(s): No prior Echocardiogram. FINDINGS  Left Ventricle: Left ventricular ejection fraction, by estimation, is 60 to 65%. The left ventricle has normal function. The left ventricle has no regional wall motion abnormalities. The left ventricular internal cavity size was normal in size. There is  no left ventricular hypertrophy. Left ventricular diastolic parameters are consistent with Grade I diastolic dysfunction (  impaired relaxation). Normal left ventricular filling pressure. Right Ventricle: The right ventricular size is normal. No increase in right ventricular wall thickness. Right ventricular systolic function was not well visualized. There is moderately elevated pulmonary artery systolic pressure. The tricuspid regurgitant velocity is 3.28 m/s, and with an assumed right atrial pressure of 3 mmHg, the estimated right ventricular systolic pressure is 46.0 mmHg. Left Atrium: Left atrial size was normal in size. Right Atrium: Right atrial size was normal in size. Pericardium: There is no evidence of pericardial effusion. Mitral Valve: The mitral valve is normal in structure. No evidence of mitral valve regurgitation. No evidence of mitral valve stenosis. Tricuspid Valve: The tricuspid valve is normal in structure. Tricuspid valve regurgitation is mild . No evidence of tricuspid stenosis. Aortic Valve: The aortic valve has an indeterminant number of cusps. There is moderate calcification of the aortic valve. Aortic valve regurgitation is not visualized. Mild aortic stenosis is present. Aortic valve mean gradient measures 15.0 mmHg. Aortic  valve peak gradient measures 27.2 mmHg. Aortic valve area, by VTI measures 1.52 cm. Pulmonic Valve: The pulmonic valve was grossly normal. Pulmonic valve regurgitation is not visualized. No evidence of pulmonic stenosis. Aorta: The aortic root is normal in size and structure. Venous: The inferior  vena cava is normal in size with greater than 50% respiratory variability, suggesting right atrial pressure of 3 mmHg. IAS/Shunts: Cannot exclude a small PFO.  LEFT VENTRICLE PLAX 2D LVIDd:         3.90 cm   Diastology LVIDs:         2.30 cm   LV e' medial:    6.96 cm/s LV PW:         1.10 cm   LV E/e' medial:  13.2 LV IVS:        1.10 cm   LV e' lateral:   11.80 cm/s LVOT diam:     2.10 cm   LV E/e' lateral: 7.8 LV SV:         61 LV SV Index:   32 LVOT Area:     3.46 cm  RIGHT VENTRICLE             IVC RV S prime:     12.70 cm/s  IVC diam: 1.40 cm TAPSE (M-mode): 1.6 cm LEFT ATRIUM             Index        RIGHT ATRIUM           Index LA diam:        5.00 cm 2.63 cm/m   RA Area:     12.30 cm LA Vol (A2C):   45.5 ml 23.91 ml/m  RA Volume:   22.30 ml  11.72 ml/m LA Vol (A4C):   54.2 ml 28.48 ml/m LA Biplane Vol: 50.2 ml 26.37 ml/m  AORTIC VALVE AV Area (Vmax):    1.62 cm AV Area (Vmean):   1.60 cm AV Area (VTI):     1.52 cm AV Vmax:           261.00 cm/s AV Vmean:          168.000 cm/s AV VTI:            0.402 m AV Peak Grad:      27.2 mmHg AV Mean Grad:      15.0 mmHg LVOT Vmax:         122.00 cm/s LVOT Vmean:        77.633 cm/s LVOT  VTI:          0.177 m LVOT/AV VTI ratio: 0.44  AORTA Ao Root diam: 3.00 cm Ao Asc diam:  2.90 cm MITRAL VALVE                TRICUSPID VALVE MV Area (PHT): 5.02 cm     TR Peak grad:   43.0 mmHg MV Decel Time: 151 msec     TR Vmax:        328.00 cm/s MR Peak grad: 19.4 mmHg MR Vmax:      220.00 cm/s   SHUNTS MV E velocity: 91.80 cm/s   Systemic VTI:  0.18 m MV A velocity: 111.00 cm/s  Systemic Diam: 2.10 cm MV E/A ratio:  0.83 Vishnu Priya Mallipeddi Electronically signed by Winfield Rast Mallipeddi Signature Date/Time: 10/28/2022/12:56:40 PM    Final    MR BRAIN WO CONTRAST  Result Date: 10/27/2022 CLINICAL DATA:  Neuro deficit, stroke suspected. EXAM: MRI HEAD WITHOUT CONTRAST MRA HEAD WITHOUT CONTRAST TECHNIQUE: Multiplanar, multi-echo pulse sequences of the brain and  surrounding structures were acquired without intravenous contrast. Angiographic images of the Circle of Willis were acquired using MRA technique without intravenous contrast. COMPARISON:  Same-day CT head FINDINGS: MRI HEAD FINDINGS Brain: There are patchy small acute to early subacute infarcts in the left corona radiata/centrum semiovale and occipital subcortical white matter in the left MCA and MCA/PCA watershed distributions without hemorrhage or mass effect. There is no acute intracranial hemorrhage or extra-axial fluid collection. Parenchymal volume is within expected limits for age. The ventricles are normal in size. Additional patchy FLAIR signal abnormality in the supratentorial white matter and pons likely reflects sequela of underlying chronic small-vessel ischemic change, mild for age. The pituitary and suprasellar region are normal. There is no mass lesion. There is no mass effect or midline shift. Vascular: The left ICA flow void is abnormal. See below for full assessment of the intracranial Skull and upper cervical spine: Vasculature. Sinuses/Orbits: There is mild mucosal thickening in the paranasal sinuses. Bilateral lens implants are in place. The globes and orbits are otherwise unremarkable. Other: The mastoid air cells and middle ear cavities are clear. MRA HEAD FINDINGS Anterior circulation: The left internal carotid artery is occluded. There is reconstitution of flow at the M1 segment via anterior and posterior communicating arteries. There is overall attenuated signal within the left MCA branches compared to the right. The right intracranial ICA is patent. The right MCA branches appear patent, without proximal stenosis or occlusion. The bilateral ACAS are patent, with multifocal atherosclerotic irregularity and narrowing but no proximal high-grade stenosis or occlusion. The anterior communicating artery is normal. There is no aneurysm or AVM. Posterior circulation: The bilateral V4 segments are  patent. The basilar artery is patent. The major cerebellar arteries appear patent. The bilateral PCAs are patent, without proximal stenosis or occlusion. A left posterior communicating artery is identified. There is no aneurysm or AVM. Anatomic variants: None. IMPRESSION: 1. Patchy acute infarcts in the left MCA and MCA/PCA watershed distributions without hemorrhage or mass effect. 2. Occluded left ICA, incompletely imaged, with reconstitution of flow in the M1 segment via anterior and posterior communicating arteries. There is overall attenuated flow in the left MCA branches relative to the right. Consider CTA head/neck for full evaluation of the left internal carotid artery. 3. Otherwise patent intracranial vasculature with multifocal atherosclerotic irregularity but no other proximal high-grade stenosis or occlusion. Electronically Signed   By: Lesia Hausen M.D.   On: 10/27/2022 19:53  MR ANGIO HEAD WO CONTRAST  Result Date: 10/27/2022 CLINICAL DATA:  Neuro deficit, stroke suspected. EXAM: MRI HEAD WITHOUT CONTRAST MRA HEAD WITHOUT CONTRAST TECHNIQUE: Multiplanar, multi-echo pulse sequences of the brain and surrounding structures were acquired without intravenous contrast. Angiographic images of the Circle of Willis were acquired using MRA technique without intravenous contrast. COMPARISON:  Same-day CT head FINDINGS: MRI HEAD FINDINGS Brain: There are patchy small acute to early subacute infarcts in the left corona radiata/centrum semiovale and occipital subcortical white matter in the left MCA and MCA/PCA watershed distributions without hemorrhage or mass effect. There is no acute intracranial hemorrhage or extra-axial fluid collection. Parenchymal volume is within expected limits for age. The ventricles are normal in size. Additional patchy FLAIR signal abnormality in the supratentorial white matter and pons likely reflects sequela of underlying chronic small-vessel ischemic change, mild for age. The  pituitary and suprasellar region are normal. There is no mass lesion. There is no mass effect or midline shift. Vascular: The left ICA flow void is abnormal. See below for full assessment of the intracranial Skull and upper cervical spine: Vasculature. Sinuses/Orbits: There is mild mucosal thickening in the paranasal sinuses. Bilateral lens implants are in place. The globes and orbits are otherwise unremarkable. Other: The mastoid air cells and middle ear cavities are clear. MRA HEAD FINDINGS Anterior circulation: The left internal carotid artery is occluded. There is reconstitution of flow at the M1 segment via anterior and posterior communicating arteries. There is overall attenuated signal within the left MCA branches compared to the right. The right intracranial ICA is patent. The right MCA branches appear patent, without proximal stenosis or occlusion. The bilateral ACAS are patent, with multifocal atherosclerotic irregularity and narrowing but no proximal high-grade stenosis or occlusion. The anterior communicating artery is normal. There is no aneurysm or AVM. Posterior circulation: The bilateral V4 segments are patent. The basilar artery is patent. The major cerebellar arteries appear patent. The bilateral PCAs are patent, without proximal stenosis or occlusion. A left posterior communicating artery is identified. There is no aneurysm or AVM. Anatomic variants: None. IMPRESSION: 1. Patchy acute infarcts in the left MCA and MCA/PCA watershed distributions without hemorrhage or mass effect. 2. Occluded left ICA, incompletely imaged, with reconstitution of flow in the M1 segment via anterior and posterior communicating arteries. There is overall attenuated flow in the left MCA branches relative to the right. Consider CTA head/neck for full evaluation of the left internal carotid artery. 3. Otherwise patent intracranial vasculature with multifocal atherosclerotic irregularity but no other proximal high-grade  stenosis or occlusion. Electronically Signed   By: Lesia Hausen M.D.   On: 10/27/2022 19:53   CT HEAD WO CONTRAST ( )  Result Date: 10/27/2022 CLINICAL DATA:  Neuro deficit, acute, stroke suspected EXAM: CT HEAD WITHOUT CONTRAST TECHNIQUE: Contiguous axial images were obtained from the base of the skull through the vertex without intravenous contrast. RADIATION DOSE REDUCTION: This exam was performed according to the departmental dose-optimization program which includes automated exposure control, adjustment of the mA and/or kV according to patient size and/or use of iterative reconstruction technique. COMPARISON:  CT Head 11/05/2022 FINDINGS: Brain: No hemorrhage. No hydrocephalus. No extra-axial fluid collection. No CT evidence of an acute cortical infarct. There is sequela of moderate chronic microvascular ischemic change. Mineralization of the basal ganglia on the left. Vascular: No hyperdense vessel or unexpected calcification. Skull: Normal. Negative for fracture or focal lesion. Sinuses/Orbits: No middle ear or mastoid effusion. Bilateral lens replacement. Orbits are otherwise unremarkable. Mucosal thickening  right sphenoid and right maxillary sinus. Other: None. IMPRESSION: No hemorrhage or CT evidence of an acute cortical infarct. Electronically Signed   By: Lorenza Cambridge M.D.   On: 10/27/2022 13:05   CT CHEST ABDOMEN PELVIS WO CONTRAST  Result Date: 10/14/2022 CLINICAL DATA:  Trauma, found down, hypothermia, tachycardia EXAM: CT CHEST, ABDOMEN AND PELVIS WITHOUT CONTRAST TECHNIQUE: Multidetector CT imaging of the chest, abdomen and pelvis was performed following the standard protocol without IV contrast. RADIATION DOSE REDUCTION: This exam was performed according to the departmental dose-optimization program which includes automated exposure control, adjustment of the mA and/or kV according to patient size and/or use of iterative reconstruction technique. COMPARISON:  None Available. FINDINGS:  Motion degraded images. CT CHEST FINDINGS Cardiovascular: The heart is top-normal in size. No pericardial effusion. No evidence of thoracic aneurysm. Atherosclerotic calcifications of the aortic arch. Severe three-vessel coronary atherosclerosis. Postsurgical changes related to prior CABG. Mediastinum/Nodes: No suspicious mediastinal lymphadenopathy. Visualized thyroid is unremarkable. Lungs/Pleura: Scattered peribronchovascular nodularity in the upper lobes, measuring up to 4 mm in the right lung apex (series 3/image 40), likely infectious/inflammatory. Mild patchy right lower lobe opacity, likely atelectasis. No focal consolidation. No pleural effusion or pneumothorax. Musculoskeletal: Thoracic spine is within normal limits. Median sternotomy. Nondisplaced left lateral 4th through 8th rib fractures. CT ABDOMEN PELVIS FINDINGS Hepatobiliary: Unenhanced liver is unremarkable. Gallbladder is unremarkable. No intrahepatic or extrahepatic duct dilatation. Pancreas: Within normal limits. Spleen: Within normal limits Adrenals/Urinary Tract: Adrenal glands are within normal limits. 4.8 cm lateral right lower pole renal cyst (series 10/image 85), measuring fluid density, benign (Bosniak I). Left kidney is within normal limits. No hydronephrosis. Bladder is within normal limits. Stomach/Bowel: Stomach is within normal limits. No evidence of bowel obstruction. Normal appendix (series 2/image 90). No colonic wall thickening or inflammatory changes. Vascular/Lymphatic: No evidence of abdominal aortic aneurysm. Atherosclerotic calcifications of the abdominal aorta and branch vessels. No suspicious abdominopelvic lymphadenopathy. Reproductive: Prostate is unremarkable. Other: No abdominopelvic ascites. Musculoskeletal: Degenerative changes of the lumbar spine. No fracture is seen. IMPRESSION: Nondisplaced left lateral 4th through 8th rib fractures. No pneumothorax. Scattered peribronchovascular nodularity in the upper lobes,  measuring up to 4 mm in the right lung apex, likely infectious/inflammatory. Given the patient's age, no follow-up recommended. No traumatic injury to the abdomen/pelvis. Electronically Signed   By: Charline Bills M.D.   On: 11/05/2022 22:15   CT HEAD WO CONTRAST ( )  Result Date: 10/21/2022 CLINICAL DATA:  Found down, hypothermic, tachycardia EXAM: CT HEAD WITHOUT CONTRAST CT CERVICAL SPINE WITHOUT CONTRAST TECHNIQUE: Multidetector CT imaging of the head and cervical spine was performed following the standard protocol without intravenous contrast. Multiplanar CT image reconstructions of the cervical spine were also generated. RADIATION DOSE REDUCTION: This exam was performed according to the departmental dose-optimization program which includes automated exposure control, adjustment of the mA and/or kV according to patient size and/or use of iterative reconstruction technique. COMPARISON:  None Available. FINDINGS: CT HEAD FINDINGS Brain: No evidence of acute infarction, hemorrhage, hydrocephalus, extra-axial collection or mass lesion/mass effect. Global cortical atrophy. Subcortical white matter and periventricular small vessel ischemic changes. Vascular: Intracranial atherosclerosis. Skull: Normal. Negative for fracture or focal lesion. Sinuses/Orbits: The visualized paranasal sinuses are essentially clear. The mastoid air cells are unopacified. Other: None. CT CERVICAL SPINE FINDINGS Alignment: Normal cervical lordosis. Skull base and vertebrae: No acute fracture. No primary bone lesion or focal pathologic process. Soft tissues and spinal canal: No prevertebral fluid or swelling. No visible canal hematoma. Disc levels: Mild multilevel  degenerative changes, most prominent at C6-7. Spinal canal is patent. Upper chest: Visualized lung apices are clear. Other: Visualized thyroid is unremarkable. IMPRESSION: No acute intracranial abnormality. Atrophy with small vessel ischemic changes. No traumatic injury to  the cervical spine. Mild degenerative changes. Electronically Signed   By: Charline Bills M.D.   On: 10/25/2022 22:09   CT CERVICAL SPINE WO CONTRAST  Result Date: 10/14/2022 CLINICAL DATA:  Found down, hypothermic, tachycardia EXAM: CT HEAD WITHOUT CONTRAST CT CERVICAL SPINE WITHOUT CONTRAST TECHNIQUE: Multidetector CT imaging of the head and cervical spine was performed following the standard protocol without intravenous contrast. Multiplanar CT image reconstructions of the cervical spine were also generated. RADIATION DOSE REDUCTION: This exam was performed according to the departmental dose-optimization program which includes automated exposure control, adjustment of the mA and/or kV according to patient size and/or use of iterative reconstruction technique. COMPARISON:  None Available. FINDINGS: CT HEAD FINDINGS Brain: No evidence of acute infarction, hemorrhage, hydrocephalus, extra-axial collection or mass lesion/mass effect. Global cortical atrophy. Subcortical white matter and periventricular small vessel ischemic changes. Vascular: Intracranial atherosclerosis. Skull: Normal. Negative for fracture or focal lesion. Sinuses/Orbits: The visualized paranasal sinuses are essentially clear. The mastoid air cells are unopacified. Other: None. CT CERVICAL SPINE FINDINGS Alignment: Normal cervical lordosis. Skull base and vertebrae: No acute fracture. No primary bone lesion or focal pathologic process. Soft tissues and spinal canal: No prevertebral fluid or swelling. No visible canal hematoma. Disc levels: Mild multilevel degenerative changes, most prominent at C6-7. Spinal canal is patent. Upper chest: Visualized lung apices are clear. Other: Visualized thyroid is unremarkable. IMPRESSION: No acute intracranial abnormality. Atrophy with small vessel ischemic changes. No traumatic injury to the cervical spine. Mild degenerative changes. Electronically Signed   By: Charline Bills M.D.   On: 10/19/2022  22:09   DG Pelvis Portable  Result Date: 10/13/2022 CLINICAL DATA:  Pain, fall. EXAM: PORTABLE PELVIS 1-2 VIEWS COMPARISON:  None Available. FINDINGS: The bones are subjectively under mineralized the cortical margins of the bony pelvis are intact. No fracture. Pubic symphysis and sacroiliac joints are congruent. Both femoral heads are well-seated in the respective acetabula. Prominent vascular calcifications. IMPRESSION: No fracture of the pelvis. Electronically Signed   By: Narda Rutherford M.D.   On: 10/12/2022 19:37   DG Knee Complete 4 Views Right  Result Date: 10/21/2022 CLINICAL DATA:  Right knee pain and swelling after fall. EXAM: RIGHT KNEE - COMPLETE 4+ VIEW COMPARISON:  Radiograph 09/10/2015 FINDINGS: The bones are subjectively under mineralized. No acute fracture or dislocation. No significant knee joint effusion. Moderate tricompartmental osteoarthritis. There are vascular calcifications. Scattered small soft tissue clips. Generalized soft tissue edema. IMPRESSION: 1. No acute fracture or dislocation of the right knee. 2. Moderate tricompartmental osteoarthritis. Electronically Signed   By: Narda Rutherford M.D.   On: 10/31/2022 19:36    Microbiology: Recent Results (from the past 240 hour(s))  Blood culture (routine x 2)     Status: None   Collection Time: 11/06/2022  6:57 PM   Specimen: BLOOD  Result Value Ref Range Status   Specimen Description BLOOD RIGHT ANTECUBITAL  Final   Special Requests   Final    BOTTLES DRAWN AEROBIC AND ANAEROBIC Blood Culture adequate volume   Culture   Final    NO GROWTH 5 DAYS Performed at [facility], [address], [place], [place] [address]    Report Status 10/29/2022 FINAL  Final  Blood culture (routine x 2)     Status: None  Collection Time: 10/25/2022  7:00 PM   Specimen: BLOOD  Result Value Ref Range Status   Specimen Description BLOOD BLOOD RIGHT ARM  Final   Special Requests   Final    BOTTLES DRAWN AEROBIC AND ANAEROBIC Blood Culture  adequate volume   Culture   Final    NO GROWTH 5 DAYS Performed at [facility], [address], [place], [place] [address]    Report Status 10/29/2022 FINAL  Final  SARS Coronavirus 2 by RT PCR (hospital order, performed in [facility] hospital lab) *cepheid single result test* Anterior Nasal Swab     Status: None   Collection Time: 10/15/2022 11:26 PM   Specimen: Anterior Nasal Swab  Result Value Ref Range Status   SARS Coronavirus 2 by RT PCR NEGATIVE NEGATIVE Final    Comment: (NOTE) SARS-CoV-2 target nucleic acids are NOT DETECTED.  The SARS-CoV-2 RNA is generally detectable in upper and lower respiratory specimens during the acute phase of infection. The lowest concentration of SARS-CoV-2 viral copies this assay can detect is 250 copies / mL. A negative result does not preclude SARS-CoV-2 infection and should not be used as the sole basis for treatment or other patient management decisions.  A negative result may occur with improper specimen collection / handling, submission of specimen other than nasopharyngeal swab, presence of viral mutation(s) within the areas targeted by this assay, and inadequate number of viral copies (<250 copies / mL). A negative result must be combined with clinical observations, patient history, and epidemiological information.  Fact Sheet for Patients:   RoadLapTop.co.za  Fact Sheet for Healthcare Providers: http://kim-miller.com/  This test is not yet approved or  cleared by the [place] FDA and has been authorized for detection and/or diagnosis of SARS-CoV-2 by FDA under an Emergency Use Authorization (EUA).  This EUA will remain in effect (meaning this test can be used) for the duration of the COVID-19 declaration under Section 564(b)(1) of the Act, 21 U.S.C. section 360bbb-3(b)(1), unless the authorization is terminated or revoked sooner.  Performed at [facility], [address],  [place], [place] [address]   MRSA Next Gen by PCR, Nasal     Status: None   Collection Time: 10/25/22  2:19 AM   Specimen: Nasal Mucosa; Nasal Swab  Result Value Ref Range Status   MRSA by PCR Next Gen NOT DETECTED NOT DETECTED Final    Comment: (NOTE) The GeneXpert MRSA Assay (FDA approved for NASAL specimens only), is one component of a comprehensive MRSA colonization surveillance program. It is not intended to diagnose MRSA infection nor to guide or monitor treatment for MRSA infections. Test performance is not FDA approved in patients less than 23 years old. Performed at [facility] [address] [place], [place] [address]      Labs: CBC: Recent Labs  Lab 10/29/22 0409 10/30/22 0443 10/31/22 0503  WBC 12.7* 16.8* 27.7*  HGB 10.2* 9.7* 9.6*  HCT 33.2* 32.4* 30.9*  MCV 99.4 100.3* 99.0  PLT 168 174 188   Basic Metabolic Panel: Recent Labs  Lab 10/26/22 0840 10/27/22 1024 10/29/22 0409 10/30/22 0443 10/31/22 0503 10/31/22 1030  NA 143 142 148* 145 143  --   K 5.1 4.6 4.2 4.0 4.2  --   CL 106 108 111 111 112*  --   CO2 20* 19* 23 20* 20*  --   GLUCOSE 133* 195* 104* 146* 114*  --   BUN 113* 100* 110* 117* 119*  --   CREATININE 4.49*  3.94* 4.09* 4.07* 3.93*  --   CALCIUM 7.5* 7.2* 8.3* 8.1* 8.0*  --   MG  --   --   --   --   --  2.2  PHOS  --   --   --   --  5.0*  --    Liver Function Tests: Recent Labs  Lab 10/29/22 0409 10/31/22 0503  AST 40  --   ALT 45*  --   ALKPHOS 106  --   BILITOT 0.8  --   PROT 5.4*  --   ALBUMIN 2.4* 2.0*   No results for input(s): "LIPASE", "AMYLASE" in the last 168 hours. No results for input(s): "AMMONIA" in the last 168 hours. Cardiac Enzymes: Recent Labs  Lab 10/26/22 0604 10/29/22 0409  CKTOTAL 1,105* 105   BNP (last 3 results) No results for input(s): "BNP" in the last 8760 hours. CBG: Recent Labs  Lab 10/30/22 0738 10/30/22 1107 10/30/22 1703 10/30/22 2122 10/31/22 0753  GLUCAP 137* 137* 155* 103* 105*     Time spent: 35 minutes  Signed:  Gillis Santa  [facility] 10-09-202409-Oct-2024 11:31 PM

## 2022-11-08 DEATH — deceased

## 2022-12-26 ENCOUNTER — Ambulatory Visit: Payer: Medicare Other | Admitting: Family Medicine
